# Patient Record
Sex: Female | Born: 1940
Health system: Southern US, Community
[De-identification: ages and names within clinical notes are randomized; demographics above are authoritative.]

## PROBLEM LIST (undated history)

## (undated) DIAGNOSIS — C4490 Unspecified malignant neoplasm of skin, unspecified: Secondary | ICD-10-CM

## (undated) DIAGNOSIS — Z808 Family history of malignant neoplasm of other organs or systems: Secondary | ICD-10-CM

## (undated) DIAGNOSIS — H409 Unspecified glaucoma: Secondary | ICD-10-CM

## (undated) DIAGNOSIS — F419 Anxiety disorder, unspecified: Secondary | ICD-10-CM

## (undated) DIAGNOSIS — E7800 Pure hypercholesterolemia, unspecified: Secondary | ICD-10-CM

## (undated) DIAGNOSIS — T8859XA Other complications of anesthesia, initial encounter: Secondary | ICD-10-CM

## (undated) DIAGNOSIS — K469 Unspecified abdominal hernia without obstruction or gangrene: Secondary | ICD-10-CM

## (undated) DIAGNOSIS — Z8042 Family history of malignant neoplasm of prostate: Secondary | ICD-10-CM

## (undated) DIAGNOSIS — D509 Iron deficiency anemia, unspecified: Secondary | ICD-10-CM

## (undated) DIAGNOSIS — Z803 Family history of malignant neoplasm of breast: Secondary | ICD-10-CM

## (undated) DIAGNOSIS — C569 Malignant neoplasm of unspecified ovary: Secondary | ICD-10-CM

## (undated) DIAGNOSIS — K5792 Diverticulitis of intestine, part unspecified, without perforation or abscess without bleeding: Secondary | ICD-10-CM

## (undated) DIAGNOSIS — I10 Essential (primary) hypertension: Secondary | ICD-10-CM

## (undated) DIAGNOSIS — Z800 Family history of malignant neoplasm of digestive organs: Secondary | ICD-10-CM

## (undated) DIAGNOSIS — E0590 Thyrotoxicosis, unspecified without thyrotoxic crisis or storm: Secondary | ICD-10-CM

## (undated) HISTORY — DX: Diverticulitis of intestine, part unspecified, without perforation or abscess without bleeding: K57.92

## (undated) HISTORY — PX: PARTIAL COLECTOMY: SHX5273

## (undated) HISTORY — DX: Unspecified glaucoma: H40.9

## (undated) HISTORY — DX: Family history of malignant neoplasm of digestive organs: Z80.0

## (undated) HISTORY — DX: Family history of malignant neoplasm of breast: Z80.3

## (undated) HISTORY — DX: Pure hypercholesterolemia, unspecified: E78.00

## (undated) HISTORY — DX: Malignant neoplasm of unspecified ovary: C56.9

## (undated) HISTORY — DX: Unspecified abdominal hernia without obstruction or gangrene: K46.9

## (undated) HISTORY — DX: Anxiety disorder, unspecified: F41.9

## (undated) HISTORY — DX: Unspecified malignant neoplasm of skin, unspecified: C44.90

## (undated) HISTORY — DX: Thyrotoxicosis, unspecified without thyrotoxic crisis or storm: E05.90

## (undated) HISTORY — DX: Family history of malignant neoplasm of prostate: Z80.42

## (undated) HISTORY — PX: OTHER SURGICAL HISTORY: SHX169

## (undated) HISTORY — PX: BREAST LUMPECTOMY: SHX2

## (undated) HISTORY — DX: Iron deficiency anemia, unspecified: D50.9

## (undated) HISTORY — DX: Essential (primary) hypertension: I10

## (undated) HISTORY — DX: Family history of malignant neoplasm of other organs or systems: Z80.8

## (undated) MED FILL — Dexamethasone Sodium Phosphate Inj 100 MG/10ML: INTRAMUSCULAR | Qty: 1 | Status: AC

## (undated) MED FILL — Fosaprepitant Dimeglumine For IV Infusion 150 MG (Base Eq): INTRAVENOUS | Qty: 5 | Status: AC

## (undated) MED FILL — Famotidine in NaCl 0.9% IV Soln 20 MG/50ML: INTRAVENOUS | Qty: 100 | Status: AC

## (undated) MED FILL — Doxorubicin HCl Liposomal Inj (For IV Infusion) 2 MG/ML: INTRAVENOUS | Qty: 25 | Status: AC

## (undated) MED FILL — Carboplatin IV Soln 450 MG/45ML: INTRAVENOUS | Qty: 36 | Status: AC

---

## 2011-03-03 DIAGNOSIS — R293 Abnormal posture: Secondary | ICD-10-CM | POA: Diagnosis not present

## 2011-03-03 DIAGNOSIS — M6281 Muscle weakness (generalized): Secondary | ICD-10-CM | POA: Diagnosis not present

## 2011-03-03 DIAGNOSIS — M545 Low back pain: Secondary | ICD-10-CM | POA: Diagnosis not present

## 2011-03-03 DIAGNOSIS — R269 Unspecified abnormalities of gait and mobility: Secondary | ICD-10-CM | POA: Diagnosis not present

## 2011-03-03 DIAGNOSIS — IMO0001 Reserved for inherently not codable concepts without codable children: Secondary | ICD-10-CM | POA: Diagnosis not present

## 2011-03-03 DIAGNOSIS — M2560 Stiffness of unspecified joint, not elsewhere classified: Secondary | ICD-10-CM | POA: Diagnosis not present

## 2011-03-05 DIAGNOSIS — IMO0001 Reserved for inherently not codable concepts without codable children: Secondary | ICD-10-CM | POA: Diagnosis not present

## 2011-03-05 DIAGNOSIS — M545 Low back pain: Secondary | ICD-10-CM | POA: Diagnosis not present

## 2011-03-05 DIAGNOSIS — R269 Unspecified abnormalities of gait and mobility: Secondary | ICD-10-CM | POA: Diagnosis not present

## 2011-03-05 DIAGNOSIS — R293 Abnormal posture: Secondary | ICD-10-CM | POA: Diagnosis not present

## 2011-03-05 DIAGNOSIS — M6281 Muscle weakness (generalized): Secondary | ICD-10-CM | POA: Diagnosis not present

## 2011-03-05 DIAGNOSIS — M2560 Stiffness of unspecified joint, not elsewhere classified: Secondary | ICD-10-CM | POA: Diagnosis not present

## 2011-03-10 DIAGNOSIS — IMO0001 Reserved for inherently not codable concepts without codable children: Secondary | ICD-10-CM | POA: Diagnosis not present

## 2011-03-10 DIAGNOSIS — R269 Unspecified abnormalities of gait and mobility: Secondary | ICD-10-CM | POA: Diagnosis not present

## 2011-03-10 DIAGNOSIS — M545 Low back pain: Secondary | ICD-10-CM | POA: Diagnosis not present

## 2011-03-10 DIAGNOSIS — R293 Abnormal posture: Secondary | ICD-10-CM | POA: Diagnosis not present

## 2011-03-10 DIAGNOSIS — M2560 Stiffness of unspecified joint, not elsewhere classified: Secondary | ICD-10-CM | POA: Diagnosis not present

## 2011-03-10 DIAGNOSIS — M6281 Muscle weakness (generalized): Secondary | ICD-10-CM | POA: Diagnosis not present

## 2011-03-12 DIAGNOSIS — IMO0001 Reserved for inherently not codable concepts without codable children: Secondary | ICD-10-CM | POA: Diagnosis not present

## 2011-03-12 DIAGNOSIS — R269 Unspecified abnormalities of gait and mobility: Secondary | ICD-10-CM | POA: Diagnosis not present

## 2011-03-12 DIAGNOSIS — R293 Abnormal posture: Secondary | ICD-10-CM | POA: Diagnosis not present

## 2011-03-12 DIAGNOSIS — M6281 Muscle weakness (generalized): Secondary | ICD-10-CM | POA: Diagnosis not present

## 2011-03-12 DIAGNOSIS — M2560 Stiffness of unspecified joint, not elsewhere classified: Secondary | ICD-10-CM | POA: Diagnosis not present

## 2011-03-12 DIAGNOSIS — M545 Low back pain: Secondary | ICD-10-CM | POA: Diagnosis not present

## 2011-03-17 DIAGNOSIS — M545 Low back pain: Secondary | ICD-10-CM | POA: Diagnosis not present

## 2011-03-17 DIAGNOSIS — M6281 Muscle weakness (generalized): Secondary | ICD-10-CM | POA: Diagnosis not present

## 2011-03-17 DIAGNOSIS — M2560 Stiffness of unspecified joint, not elsewhere classified: Secondary | ICD-10-CM | POA: Diagnosis not present

## 2011-03-17 DIAGNOSIS — R269 Unspecified abnormalities of gait and mobility: Secondary | ICD-10-CM | POA: Diagnosis not present

## 2011-03-17 DIAGNOSIS — IMO0001 Reserved for inherently not codable concepts without codable children: Secondary | ICD-10-CM | POA: Diagnosis not present

## 2011-03-17 DIAGNOSIS — R293 Abnormal posture: Secondary | ICD-10-CM | POA: Diagnosis not present

## 2011-03-19 DIAGNOSIS — M2560 Stiffness of unspecified joint, not elsewhere classified: Secondary | ICD-10-CM | POA: Diagnosis not present

## 2011-03-19 DIAGNOSIS — M545 Low back pain: Secondary | ICD-10-CM | POA: Diagnosis not present

## 2011-03-19 DIAGNOSIS — IMO0001 Reserved for inherently not codable concepts without codable children: Secondary | ICD-10-CM | POA: Diagnosis not present

## 2011-03-19 DIAGNOSIS — R293 Abnormal posture: Secondary | ICD-10-CM | POA: Diagnosis not present

## 2011-03-19 DIAGNOSIS — R269 Unspecified abnormalities of gait and mobility: Secondary | ICD-10-CM | POA: Diagnosis not present

## 2011-03-19 DIAGNOSIS — M6281 Muscle weakness (generalized): Secondary | ICD-10-CM | POA: Diagnosis not present

## 2011-03-23 DIAGNOSIS — Z1331 Encounter for screening for depression: Secondary | ICD-10-CM | POA: Diagnosis not present

## 2011-03-23 DIAGNOSIS — I10 Essential (primary) hypertension: Secondary | ICD-10-CM | POA: Diagnosis not present

## 2011-03-25 DIAGNOSIS — M545 Low back pain: Secondary | ICD-10-CM | POA: Diagnosis not present

## 2011-03-25 DIAGNOSIS — M6281 Muscle weakness (generalized): Secondary | ICD-10-CM | POA: Diagnosis not present

## 2011-03-25 DIAGNOSIS — IMO0001 Reserved for inherently not codable concepts without codable children: Secondary | ICD-10-CM | POA: Diagnosis not present

## 2011-03-25 DIAGNOSIS — M2560 Stiffness of unspecified joint, not elsewhere classified: Secondary | ICD-10-CM | POA: Diagnosis not present

## 2011-03-25 DIAGNOSIS — R293 Abnormal posture: Secondary | ICD-10-CM | POA: Diagnosis not present

## 2011-03-25 DIAGNOSIS — R269 Unspecified abnormalities of gait and mobility: Secondary | ICD-10-CM | POA: Diagnosis not present

## 2011-04-09 DIAGNOSIS — H409 Unspecified glaucoma: Secondary | ICD-10-CM | POA: Diagnosis not present

## 2011-04-23 DIAGNOSIS — Z13220 Encounter for screening for lipoid disorders: Secondary | ICD-10-CM | POA: Diagnosis not present

## 2011-04-23 DIAGNOSIS — I10 Essential (primary) hypertension: Secondary | ICD-10-CM | POA: Diagnosis not present

## 2011-04-23 DIAGNOSIS — E782 Mixed hyperlipidemia: Secondary | ICD-10-CM | POA: Diagnosis not present

## 2011-04-27 DIAGNOSIS — L509 Urticaria, unspecified: Secondary | ICD-10-CM | POA: Diagnosis not present

## 2011-05-14 DIAGNOSIS — Z0000 Encounter for general adult medical examination without abnormal findings: Secondary | ICD-10-CM | POA: Diagnosis not present

## 2011-05-14 DIAGNOSIS — Z1210 Encounter for screening for malignant neoplasm of intestinal tract, unspecified: Secondary | ICD-10-CM | POA: Diagnosis not present

## 2011-05-14 DIAGNOSIS — Z01419 Encounter for gynecological examination (general) (routine) without abnormal findings: Secondary | ICD-10-CM | POA: Diagnosis not present

## 2011-05-14 DIAGNOSIS — Z1231 Encounter for screening mammogram for malignant neoplasm of breast: Secondary | ICD-10-CM | POA: Diagnosis not present

## 2011-05-19 DIAGNOSIS — Z1231 Encounter for screening mammogram for malignant neoplasm of breast: Secondary | ICD-10-CM | POA: Diagnosis not present

## 2011-06-09 DIAGNOSIS — Z800 Family history of malignant neoplasm of digestive organs: Secondary | ICD-10-CM | POA: Diagnosis not present

## 2011-06-09 DIAGNOSIS — Z1211 Encounter for screening for malignant neoplasm of colon: Secondary | ICD-10-CM | POA: Diagnosis not present

## 2011-06-22 DIAGNOSIS — Z0000 Encounter for general adult medical examination without abnormal findings: Secondary | ICD-10-CM | POA: Diagnosis not present

## 2011-07-06 DIAGNOSIS — H900 Conductive hearing loss, bilateral: Secondary | ICD-10-CM | POA: Diagnosis not present

## 2011-07-06 DIAGNOSIS — G4700 Insomnia, unspecified: Secondary | ICD-10-CM | POA: Diagnosis not present

## 2011-08-17 DIAGNOSIS — I10 Essential (primary) hypertension: Secondary | ICD-10-CM | POA: Diagnosis not present

## 2011-08-17 DIAGNOSIS — K648 Other hemorrhoids: Secondary | ICD-10-CM | POA: Diagnosis not present

## 2011-08-17 DIAGNOSIS — Z79899 Other long term (current) drug therapy: Secondary | ICD-10-CM | POA: Diagnosis not present

## 2011-08-17 DIAGNOSIS — K5730 Diverticulosis of large intestine without perforation or abscess without bleeding: Secondary | ICD-10-CM | POA: Diagnosis not present

## 2011-08-17 DIAGNOSIS — Z800 Family history of malignant neoplasm of digestive organs: Secondary | ICD-10-CM | POA: Diagnosis not present

## 2011-08-17 DIAGNOSIS — H409 Unspecified glaucoma: Secondary | ICD-10-CM | POA: Diagnosis not present

## 2011-08-17 DIAGNOSIS — E7800 Pure hypercholesterolemia, unspecified: Secondary | ICD-10-CM | POA: Diagnosis not present

## 2011-08-17 DIAGNOSIS — M1990 Unspecified osteoarthritis, unspecified site: Secondary | ICD-10-CM | POA: Diagnosis not present

## 2011-08-17 DIAGNOSIS — Z1211 Encounter for screening for malignant neoplasm of colon: Secondary | ICD-10-CM | POA: Diagnosis not present

## 2011-08-17 DIAGNOSIS — Z87891 Personal history of nicotine dependence: Secondary | ICD-10-CM | POA: Diagnosis not present

## 2011-08-17 DIAGNOSIS — E0590 Thyrotoxicosis, unspecified without thyrotoxic crisis or storm: Secondary | ICD-10-CM | POA: Diagnosis not present

## 2011-08-17 DIAGNOSIS — K644 Residual hemorrhoidal skin tags: Secondary | ICD-10-CM | POA: Diagnosis not present

## 2011-09-03 DIAGNOSIS — H2589 Other age-related cataract: Secondary | ICD-10-CM | POA: Diagnosis not present

## 2011-09-03 DIAGNOSIS — H409 Unspecified glaucoma: Secondary | ICD-10-CM | POA: Diagnosis not present

## 2011-09-03 DIAGNOSIS — H04129 Dry eye syndrome of unspecified lacrimal gland: Secondary | ICD-10-CM | POA: Diagnosis not present

## 2012-02-01 DIAGNOSIS — E0500 Thyrotoxicosis with diffuse goiter without thyrotoxic crisis or storm: Secondary | ICD-10-CM | POA: Diagnosis not present

## 2012-02-01 DIAGNOSIS — I10 Essential (primary) hypertension: Secondary | ICD-10-CM | POA: Diagnosis not present

## 2012-02-12 DIAGNOSIS — R300 Dysuria: Secondary | ICD-10-CM | POA: Diagnosis not present

## 2012-02-12 DIAGNOSIS — N3000 Acute cystitis without hematuria: Secondary | ICD-10-CM | POA: Diagnosis not present

## 2012-02-21 DIAGNOSIS — R109 Unspecified abdominal pain: Secondary | ICD-10-CM | POA: Diagnosis not present

## 2012-02-21 DIAGNOSIS — R1032 Left lower quadrant pain: Secondary | ICD-10-CM | POA: Diagnosis not present

## 2012-02-21 DIAGNOSIS — B9689 Other specified bacterial agents as the cause of diseases classified elsewhere: Secondary | ICD-10-CM | POA: Diagnosis not present

## 2012-02-21 DIAGNOSIS — E0590 Thyrotoxicosis, unspecified without thyrotoxic crisis or storm: Secondary | ICD-10-CM | POA: Diagnosis not present

## 2012-02-21 DIAGNOSIS — I10 Essential (primary) hypertension: Secondary | ICD-10-CM | POA: Diagnosis not present

## 2012-02-21 DIAGNOSIS — Z7982 Long term (current) use of aspirin: Secondary | ICD-10-CM | POA: Diagnosis not present

## 2012-02-21 DIAGNOSIS — K5732 Diverticulitis of large intestine without perforation or abscess without bleeding: Secondary | ICD-10-CM | POA: Diagnosis not present

## 2012-02-21 DIAGNOSIS — K631 Perforation of intestine (nontraumatic): Secondary | ICD-10-CM | POA: Diagnosis not present

## 2012-03-06 DIAGNOSIS — S68118A Complete traumatic metacarpophalangeal amputation of other finger, initial encounter: Secondary | ICD-10-CM | POA: Diagnosis not present

## 2012-03-06 DIAGNOSIS — K651 Peritoneal abscess: Secondary | ICD-10-CM | POA: Diagnosis not present

## 2012-03-06 DIAGNOSIS — Z5331 Laparoscopic surgical procedure converted to open procedure: Secondary | ICD-10-CM | POA: Diagnosis not present

## 2012-03-06 DIAGNOSIS — K660 Peritoneal adhesions (postprocedural) (postinfection): Secondary | ICD-10-CM | POA: Diagnosis not present

## 2012-03-06 DIAGNOSIS — K630 Abscess of intestine: Secondary | ICD-10-CM | POA: Diagnosis not present

## 2012-03-06 DIAGNOSIS — B952 Enterococcus as the cause of diseases classified elsewhere: Secondary | ICD-10-CM | POA: Diagnosis not present

## 2012-03-06 DIAGNOSIS — R1032 Left lower quadrant pain: Secondary | ICD-10-CM | POA: Diagnosis not present

## 2012-03-06 DIAGNOSIS — R197 Diarrhea, unspecified: Secondary | ICD-10-CM | POA: Diagnosis not present

## 2012-03-06 DIAGNOSIS — E0590 Thyrotoxicosis, unspecified without thyrotoxic crisis or storm: Secondary | ICD-10-CM | POA: Diagnosis not present

## 2012-03-06 DIAGNOSIS — I10 Essential (primary) hypertension: Secondary | ICD-10-CM | POA: Diagnosis not present

## 2012-03-06 DIAGNOSIS — R112 Nausea with vomiting, unspecified: Secondary | ICD-10-CM | POA: Diagnosis not present

## 2012-03-06 DIAGNOSIS — K5732 Diverticulitis of large intestine without perforation or abscess without bleeding: Secondary | ICD-10-CM | POA: Diagnosis not present

## 2012-03-07 DIAGNOSIS — K630 Abscess of intestine: Secondary | ICD-10-CM | POA: Diagnosis not present

## 2012-03-07 DIAGNOSIS — R112 Nausea with vomiting, unspecified: Secondary | ICD-10-CM | POA: Diagnosis not present

## 2012-03-07 DIAGNOSIS — Z5331 Laparoscopic surgical procedure converted to open procedure: Secondary | ICD-10-CM | POA: Diagnosis not present

## 2012-03-07 DIAGNOSIS — K651 Peritoneal abscess: Secondary | ICD-10-CM | POA: Diagnosis not present

## 2012-03-07 DIAGNOSIS — I10 Essential (primary) hypertension: Secondary | ICD-10-CM | POA: Diagnosis not present

## 2012-03-07 DIAGNOSIS — R1032 Left lower quadrant pain: Secondary | ICD-10-CM | POA: Diagnosis not present

## 2012-03-07 DIAGNOSIS — E0590 Thyrotoxicosis, unspecified without thyrotoxic crisis or storm: Secondary | ICD-10-CM | POA: Diagnosis present

## 2012-03-07 DIAGNOSIS — E039 Hypothyroidism, unspecified: Secondary | ICD-10-CM | POA: Diagnosis not present

## 2012-03-07 DIAGNOSIS — K660 Peritoneal adhesions (postprocedural) (postinfection): Secondary | ICD-10-CM | POA: Diagnosis not present

## 2012-03-07 DIAGNOSIS — R197 Diarrhea, unspecified: Secondary | ICD-10-CM | POA: Diagnosis not present

## 2012-03-07 DIAGNOSIS — B952 Enterococcus as the cause of diseases classified elsewhere: Secondary | ICD-10-CM | POA: Diagnosis not present

## 2012-03-07 DIAGNOSIS — S68118A Complete traumatic metacarpophalangeal amputation of other finger, initial encounter: Secondary | ICD-10-CM | POA: Diagnosis not present

## 2012-03-07 DIAGNOSIS — K5732 Diverticulitis of large intestine without perforation or abscess without bleeding: Secondary | ICD-10-CM | POA: Diagnosis not present

## 2012-03-16 DIAGNOSIS — Z433 Encounter for attention to colostomy: Secondary | ICD-10-CM | POA: Diagnosis not present

## 2012-03-16 DIAGNOSIS — Z1639 Resistance to other specified antimicrobial drug: Secondary | ICD-10-CM | POA: Diagnosis not present

## 2012-03-16 DIAGNOSIS — Z4789 Encounter for other orthopedic aftercare: Secondary | ICD-10-CM | POA: Diagnosis not present

## 2012-03-16 DIAGNOSIS — I10 Essential (primary) hypertension: Secondary | ICD-10-CM | POA: Diagnosis not present

## 2012-03-18 DIAGNOSIS — I10 Essential (primary) hypertension: Secondary | ICD-10-CM | POA: Diagnosis not present

## 2012-03-18 DIAGNOSIS — Z433 Encounter for attention to colostomy: Secondary | ICD-10-CM | POA: Diagnosis not present

## 2012-03-18 DIAGNOSIS — Z4789 Encounter for other orthopedic aftercare: Secondary | ICD-10-CM | POA: Diagnosis not present

## 2012-03-18 DIAGNOSIS — Z1639 Resistance to other specified antimicrobial drug: Secondary | ICD-10-CM | POA: Diagnosis not present

## 2012-03-22 DIAGNOSIS — Z433 Encounter for attention to colostomy: Secondary | ICD-10-CM | POA: Diagnosis not present

## 2012-03-22 DIAGNOSIS — Z1639 Resistance to other specified antimicrobial drug: Secondary | ICD-10-CM | POA: Diagnosis not present

## 2012-03-22 DIAGNOSIS — Z4789 Encounter for other orthopedic aftercare: Secondary | ICD-10-CM | POA: Diagnosis not present

## 2012-03-22 DIAGNOSIS — I10 Essential (primary) hypertension: Secondary | ICD-10-CM | POA: Diagnosis not present

## 2012-03-25 DIAGNOSIS — I10 Essential (primary) hypertension: Secondary | ICD-10-CM | POA: Diagnosis not present

## 2012-03-25 DIAGNOSIS — Z1639 Resistance to other specified antimicrobial drug: Secondary | ICD-10-CM | POA: Diagnosis not present

## 2012-03-25 DIAGNOSIS — Z4789 Encounter for other orthopedic aftercare: Secondary | ICD-10-CM | POA: Diagnosis not present

## 2012-03-25 DIAGNOSIS — Z433 Encounter for attention to colostomy: Secondary | ICD-10-CM | POA: Diagnosis not present

## 2012-03-29 DIAGNOSIS — Z433 Encounter for attention to colostomy: Secondary | ICD-10-CM | POA: Diagnosis not present

## 2012-03-29 DIAGNOSIS — B373 Candidiasis of vulva and vagina: Secondary | ICD-10-CM | POA: Diagnosis not present

## 2012-03-29 DIAGNOSIS — R5383 Other fatigue: Secondary | ICD-10-CM | POA: Diagnosis not present

## 2012-03-29 DIAGNOSIS — Z4789 Encounter for other orthopedic aftercare: Secondary | ICD-10-CM | POA: Diagnosis not present

## 2012-03-29 DIAGNOSIS — I10 Essential (primary) hypertension: Secondary | ICD-10-CM | POA: Diagnosis not present

## 2012-03-29 DIAGNOSIS — R5381 Other malaise: Secondary | ICD-10-CM | POA: Diagnosis not present

## 2012-03-29 DIAGNOSIS — Z1639 Resistance to other specified antimicrobial drug: Secondary | ICD-10-CM | POA: Diagnosis not present

## 2012-04-01 DIAGNOSIS — Z4789 Encounter for other orthopedic aftercare: Secondary | ICD-10-CM | POA: Diagnosis not present

## 2012-04-01 DIAGNOSIS — Z433 Encounter for attention to colostomy: Secondary | ICD-10-CM | POA: Diagnosis not present

## 2012-04-01 DIAGNOSIS — I10 Essential (primary) hypertension: Secondary | ICD-10-CM | POA: Diagnosis not present

## 2012-04-01 DIAGNOSIS — Z1639 Resistance to other specified antimicrobial drug: Secondary | ICD-10-CM | POA: Diagnosis not present

## 2012-04-04 DIAGNOSIS — Z433 Encounter for attention to colostomy: Secondary | ICD-10-CM | POA: Diagnosis not present

## 2012-04-05 DIAGNOSIS — Z4789 Encounter for other orthopedic aftercare: Secondary | ICD-10-CM | POA: Diagnosis not present

## 2012-04-05 DIAGNOSIS — Z433 Encounter for attention to colostomy: Secondary | ICD-10-CM | POA: Diagnosis not present

## 2012-04-05 DIAGNOSIS — Z1639 Resistance to other specified antimicrobial drug: Secondary | ICD-10-CM | POA: Diagnosis not present

## 2012-04-05 DIAGNOSIS — I10 Essential (primary) hypertension: Secondary | ICD-10-CM | POA: Diagnosis not present

## 2012-04-09 DIAGNOSIS — I10 Essential (primary) hypertension: Secondary | ICD-10-CM | POA: Diagnosis not present

## 2012-04-09 DIAGNOSIS — Z433 Encounter for attention to colostomy: Secondary | ICD-10-CM | POA: Diagnosis not present

## 2012-04-09 DIAGNOSIS — Z1639 Resistance to other specified antimicrobial drug: Secondary | ICD-10-CM | POA: Diagnosis not present

## 2012-04-09 DIAGNOSIS — Z4789 Encounter for other orthopedic aftercare: Secondary | ICD-10-CM | POA: Diagnosis not present

## 2012-04-12 DIAGNOSIS — Z433 Encounter for attention to colostomy: Secondary | ICD-10-CM | POA: Diagnosis not present

## 2012-04-12 DIAGNOSIS — I10 Essential (primary) hypertension: Secondary | ICD-10-CM | POA: Diagnosis not present

## 2012-04-12 DIAGNOSIS — Z1639 Resistance to other specified antimicrobial drug: Secondary | ICD-10-CM | POA: Diagnosis not present

## 2012-04-12 DIAGNOSIS — Z4789 Encounter for other orthopedic aftercare: Secondary | ICD-10-CM | POA: Diagnosis not present

## 2012-04-15 DIAGNOSIS — Z1639 Resistance to other specified antimicrobial drug: Secondary | ICD-10-CM | POA: Diagnosis not present

## 2012-04-15 DIAGNOSIS — Z433 Encounter for attention to colostomy: Secondary | ICD-10-CM | POA: Diagnosis not present

## 2012-04-15 DIAGNOSIS — I10 Essential (primary) hypertension: Secondary | ICD-10-CM | POA: Diagnosis not present

## 2012-04-15 DIAGNOSIS — Z4789 Encounter for other orthopedic aftercare: Secondary | ICD-10-CM | POA: Diagnosis not present

## 2012-04-19 DIAGNOSIS — Z433 Encounter for attention to colostomy: Secondary | ICD-10-CM | POA: Diagnosis not present

## 2012-04-19 DIAGNOSIS — Z1639 Resistance to other specified antimicrobial drug: Secondary | ICD-10-CM | POA: Diagnosis not present

## 2012-04-19 DIAGNOSIS — Z4789 Encounter for other orthopedic aftercare: Secondary | ICD-10-CM | POA: Diagnosis not present

## 2012-04-19 DIAGNOSIS — I10 Essential (primary) hypertension: Secondary | ICD-10-CM | POA: Diagnosis not present

## 2012-04-20 DIAGNOSIS — H409 Unspecified glaucoma: Secondary | ICD-10-CM | POA: Diagnosis not present

## 2012-04-20 DIAGNOSIS — Z933 Colostomy status: Secondary | ICD-10-CM | POA: Diagnosis not present

## 2012-04-20 DIAGNOSIS — R1032 Left lower quadrant pain: Secondary | ICD-10-CM | POA: Diagnosis not present

## 2012-04-20 DIAGNOSIS — Z8719 Personal history of other diseases of the digestive system: Secondary | ICD-10-CM | POA: Diagnosis not present

## 2012-04-20 DIAGNOSIS — K5732 Diverticulitis of large intestine without perforation or abscess without bleeding: Secondary | ICD-10-CM | POA: Diagnosis not present

## 2012-04-20 DIAGNOSIS — Z9049 Acquired absence of other specified parts of digestive tract: Secondary | ICD-10-CM | POA: Diagnosis not present

## 2012-04-20 DIAGNOSIS — R110 Nausea: Secondary | ICD-10-CM | POA: Diagnosis not present

## 2012-04-20 DIAGNOSIS — I10 Essential (primary) hypertension: Secondary | ICD-10-CM | POA: Diagnosis not present

## 2012-04-20 DIAGNOSIS — E0500 Thyrotoxicosis with diffuse goiter without thyrotoxic crisis or storm: Secondary | ICD-10-CM | POA: Diagnosis not present

## 2012-04-22 DIAGNOSIS — I10 Essential (primary) hypertension: Secondary | ICD-10-CM | POA: Diagnosis not present

## 2012-04-22 DIAGNOSIS — Z1639 Resistance to other specified antimicrobial drug: Secondary | ICD-10-CM | POA: Diagnosis not present

## 2012-04-22 DIAGNOSIS — Z433 Encounter for attention to colostomy: Secondary | ICD-10-CM | POA: Diagnosis not present

## 2012-04-22 DIAGNOSIS — Z4789 Encounter for other orthopedic aftercare: Secondary | ICD-10-CM | POA: Diagnosis not present

## 2012-04-23 DIAGNOSIS — Z9049 Acquired absence of other specified parts of digestive tract: Secondary | ICD-10-CM | POA: Diagnosis not present

## 2012-04-23 DIAGNOSIS — K5732 Diverticulitis of large intestine without perforation or abscess without bleeding: Secondary | ICD-10-CM | POA: Diagnosis not present

## 2012-04-23 DIAGNOSIS — I10 Essential (primary) hypertension: Secondary | ICD-10-CM | POA: Diagnosis not present

## 2012-04-23 DIAGNOSIS — Z7982 Long term (current) use of aspirin: Secondary | ICD-10-CM | POA: Diagnosis not present

## 2012-04-23 DIAGNOSIS — R110 Nausea: Secondary | ICD-10-CM | POA: Diagnosis not present

## 2012-04-23 DIAGNOSIS — E0500 Thyrotoxicosis with diffuse goiter without thyrotoxic crisis or storm: Secondary | ICD-10-CM | POA: Diagnosis not present

## 2012-04-23 DIAGNOSIS — Z933 Colostomy status: Secondary | ICD-10-CM | POA: Diagnosis not present

## 2012-04-23 DIAGNOSIS — Z8719 Personal history of other diseases of the digestive system: Secondary | ICD-10-CM | POA: Diagnosis not present

## 2012-04-23 DIAGNOSIS — H409 Unspecified glaucoma: Secondary | ICD-10-CM | POA: Diagnosis not present

## 2012-04-23 DIAGNOSIS — R1032 Left lower quadrant pain: Secondary | ICD-10-CM | POA: Diagnosis not present

## 2012-04-23 DIAGNOSIS — Z87891 Personal history of nicotine dependence: Secondary | ICD-10-CM | POA: Diagnosis not present

## 2012-04-23 DIAGNOSIS — Z79899 Other long term (current) drug therapy: Secondary | ICD-10-CM | POA: Diagnosis not present

## 2012-05-02 DIAGNOSIS — Z1639 Resistance to other specified antimicrobial drug: Secondary | ICD-10-CM | POA: Diagnosis not present

## 2012-05-02 DIAGNOSIS — Z4789 Encounter for other orthopedic aftercare: Secondary | ICD-10-CM | POA: Diagnosis not present

## 2012-05-02 DIAGNOSIS — I10 Essential (primary) hypertension: Secondary | ICD-10-CM | POA: Diagnosis not present

## 2012-05-02 DIAGNOSIS — Z433 Encounter for attention to colostomy: Secondary | ICD-10-CM | POA: Diagnosis not present

## 2012-05-06 DIAGNOSIS — I10 Essential (primary) hypertension: Secondary | ICD-10-CM | POA: Diagnosis not present

## 2012-05-06 DIAGNOSIS — Z1639 Resistance to other specified antimicrobial drug: Secondary | ICD-10-CM | POA: Diagnosis not present

## 2012-05-06 DIAGNOSIS — Z4789 Encounter for other orthopedic aftercare: Secondary | ICD-10-CM | POA: Diagnosis not present

## 2012-05-06 DIAGNOSIS — Z433 Encounter for attention to colostomy: Secondary | ICD-10-CM | POA: Diagnosis not present

## 2012-05-10 DIAGNOSIS — Z4789 Encounter for other orthopedic aftercare: Secondary | ICD-10-CM | POA: Diagnosis not present

## 2012-05-10 DIAGNOSIS — I10 Essential (primary) hypertension: Secondary | ICD-10-CM | POA: Diagnosis not present

## 2012-05-10 DIAGNOSIS — Z433 Encounter for attention to colostomy: Secondary | ICD-10-CM | POA: Diagnosis not present

## 2012-05-10 DIAGNOSIS — Z1639 Resistance to other specified antimicrobial drug: Secondary | ICD-10-CM | POA: Diagnosis not present

## 2012-05-13 DIAGNOSIS — Z433 Encounter for attention to colostomy: Secondary | ICD-10-CM | POA: Diagnosis not present

## 2012-05-13 DIAGNOSIS — Z1639 Resistance to other specified antimicrobial drug: Secondary | ICD-10-CM | POA: Diagnosis not present

## 2012-05-13 DIAGNOSIS — I10 Essential (primary) hypertension: Secondary | ICD-10-CM | POA: Diagnosis not present

## 2012-05-13 DIAGNOSIS — Z4789 Encounter for other orthopedic aftercare: Secondary | ICD-10-CM | POA: Diagnosis not present

## 2012-05-15 DIAGNOSIS — T8140XA Infection following a procedure, unspecified, initial encounter: Secondary | ICD-10-CM | POA: Diagnosis not present

## 2012-05-15 DIAGNOSIS — Z1639 Resistance to other specified antimicrobial drug: Secondary | ICD-10-CM | POA: Diagnosis not present

## 2012-05-15 DIAGNOSIS — Z433 Encounter for attention to colostomy: Secondary | ICD-10-CM | POA: Diagnosis not present

## 2012-05-15 DIAGNOSIS — I10 Essential (primary) hypertension: Secondary | ICD-10-CM | POA: Diagnosis not present

## 2012-05-16 DIAGNOSIS — Z1639 Resistance to other specified antimicrobial drug: Secondary | ICD-10-CM | POA: Diagnosis not present

## 2012-05-16 DIAGNOSIS — Z433 Encounter for attention to colostomy: Secondary | ICD-10-CM | POA: Diagnosis not present

## 2012-05-16 DIAGNOSIS — G8918 Other acute postprocedural pain: Secondary | ICD-10-CM | POA: Diagnosis not present

## 2012-05-16 DIAGNOSIS — I10 Essential (primary) hypertension: Secondary | ICD-10-CM | POA: Diagnosis not present

## 2012-05-16 DIAGNOSIS — Z79899 Other long term (current) drug therapy: Secondary | ICD-10-CM | POA: Diagnosis not present

## 2012-05-16 DIAGNOSIS — T8140XA Infection following a procedure, unspecified, initial encounter: Secondary | ICD-10-CM | POA: Diagnosis not present

## 2012-05-18 DIAGNOSIS — Z1639 Resistance to other specified antimicrobial drug: Secondary | ICD-10-CM | POA: Diagnosis not present

## 2012-05-18 DIAGNOSIS — Z433 Encounter for attention to colostomy: Secondary | ICD-10-CM | POA: Diagnosis not present

## 2012-05-18 DIAGNOSIS — T8140XA Infection following a procedure, unspecified, initial encounter: Secondary | ICD-10-CM | POA: Diagnosis not present

## 2012-05-18 DIAGNOSIS — I10 Essential (primary) hypertension: Secondary | ICD-10-CM | POA: Diagnosis not present

## 2012-05-20 DIAGNOSIS — T8140XA Infection following a procedure, unspecified, initial encounter: Secondary | ICD-10-CM | POA: Diagnosis not present

## 2012-05-20 DIAGNOSIS — Z433 Encounter for attention to colostomy: Secondary | ICD-10-CM | POA: Diagnosis not present

## 2012-05-20 DIAGNOSIS — Z1639 Resistance to other specified antimicrobial drug: Secondary | ICD-10-CM | POA: Diagnosis not present

## 2012-05-20 DIAGNOSIS — I10 Essential (primary) hypertension: Secondary | ICD-10-CM | POA: Diagnosis not present

## 2012-05-24 DIAGNOSIS — T8140XA Infection following a procedure, unspecified, initial encounter: Secondary | ICD-10-CM | POA: Diagnosis not present

## 2012-05-24 DIAGNOSIS — Z433 Encounter for attention to colostomy: Secondary | ICD-10-CM | POA: Diagnosis not present

## 2012-05-24 DIAGNOSIS — I10 Essential (primary) hypertension: Secondary | ICD-10-CM | POA: Diagnosis not present

## 2012-05-24 DIAGNOSIS — Z1639 Resistance to other specified antimicrobial drug: Secondary | ICD-10-CM | POA: Diagnosis not present

## 2012-05-27 DIAGNOSIS — I10 Essential (primary) hypertension: Secondary | ICD-10-CM | POA: Diagnosis not present

## 2012-05-27 DIAGNOSIS — Z433 Encounter for attention to colostomy: Secondary | ICD-10-CM | POA: Diagnosis not present

## 2012-05-27 DIAGNOSIS — Z1639 Resistance to other specified antimicrobial drug: Secondary | ICD-10-CM | POA: Diagnosis not present

## 2012-05-27 DIAGNOSIS — T8140XA Infection following a procedure, unspecified, initial encounter: Secondary | ICD-10-CM | POA: Diagnosis not present

## 2012-05-31 DIAGNOSIS — T8140XA Infection following a procedure, unspecified, initial encounter: Secondary | ICD-10-CM | POA: Diagnosis not present

## 2012-05-31 DIAGNOSIS — I10 Essential (primary) hypertension: Secondary | ICD-10-CM | POA: Diagnosis not present

## 2012-05-31 DIAGNOSIS — Z1639 Resistance to other specified antimicrobial drug: Secondary | ICD-10-CM | POA: Diagnosis not present

## 2012-05-31 DIAGNOSIS — Z433 Encounter for attention to colostomy: Secondary | ICD-10-CM | POA: Diagnosis not present

## 2012-06-03 DIAGNOSIS — I10 Essential (primary) hypertension: Secondary | ICD-10-CM | POA: Diagnosis not present

## 2012-06-03 DIAGNOSIS — T8140XA Infection following a procedure, unspecified, initial encounter: Secondary | ICD-10-CM | POA: Diagnosis not present

## 2012-06-03 DIAGNOSIS — Z1639 Resistance to other specified antimicrobial drug: Secondary | ICD-10-CM | POA: Diagnosis not present

## 2012-06-03 DIAGNOSIS — Z433 Encounter for attention to colostomy: Secondary | ICD-10-CM | POA: Diagnosis not present

## 2012-06-07 DIAGNOSIS — T8140XA Infection following a procedure, unspecified, initial encounter: Secondary | ICD-10-CM | POA: Diagnosis not present

## 2012-06-07 DIAGNOSIS — Z433 Encounter for attention to colostomy: Secondary | ICD-10-CM | POA: Diagnosis not present

## 2012-06-07 DIAGNOSIS — Z1639 Resistance to other specified antimicrobial drug: Secondary | ICD-10-CM | POA: Diagnosis not present

## 2012-06-07 DIAGNOSIS — I10 Essential (primary) hypertension: Secondary | ICD-10-CM | POA: Diagnosis not present

## 2012-06-08 DIAGNOSIS — I10 Essential (primary) hypertension: Secondary | ICD-10-CM | POA: Diagnosis not present

## 2012-06-08 DIAGNOSIS — Z1639 Resistance to other specified antimicrobial drug: Secondary | ICD-10-CM | POA: Diagnosis not present

## 2012-06-08 DIAGNOSIS — Z433 Encounter for attention to colostomy: Secondary | ICD-10-CM | POA: Diagnosis not present

## 2012-06-08 DIAGNOSIS — Z8639 Personal history of other endocrine, nutritional and metabolic disease: Secondary | ICD-10-CM | POA: Diagnosis not present

## 2012-06-08 DIAGNOSIS — B952 Enterococcus as the cause of diseases classified elsewhere: Secondary | ICD-10-CM | POA: Diagnosis not present

## 2012-06-08 DIAGNOSIS — Z862 Personal history of diseases of the blood and blood-forming organs and certain disorders involving the immune mechanism: Secondary | ICD-10-CM | POA: Diagnosis not present

## 2012-06-08 DIAGNOSIS — K5732 Diverticulitis of large intestine without perforation or abscess without bleeding: Secondary | ICD-10-CM | POA: Diagnosis not present

## 2012-06-10 DIAGNOSIS — Z433 Encounter for attention to colostomy: Secondary | ICD-10-CM | POA: Diagnosis not present

## 2012-06-10 DIAGNOSIS — T8140XA Infection following a procedure, unspecified, initial encounter: Secondary | ICD-10-CM | POA: Diagnosis not present

## 2012-06-10 DIAGNOSIS — Z1639 Resistance to other specified antimicrobial drug: Secondary | ICD-10-CM | POA: Diagnosis not present

## 2012-06-10 DIAGNOSIS — I10 Essential (primary) hypertension: Secondary | ICD-10-CM | POA: Diagnosis not present

## 2012-06-14 DIAGNOSIS — T8140XA Infection following a procedure, unspecified, initial encounter: Secondary | ICD-10-CM | POA: Diagnosis not present

## 2012-06-14 DIAGNOSIS — I10 Essential (primary) hypertension: Secondary | ICD-10-CM | POA: Diagnosis not present

## 2012-06-14 DIAGNOSIS — Z1639 Resistance to other specified antimicrobial drug: Secondary | ICD-10-CM | POA: Diagnosis not present

## 2012-06-14 DIAGNOSIS — Z433 Encounter for attention to colostomy: Secondary | ICD-10-CM | POA: Diagnosis not present

## 2012-06-15 DIAGNOSIS — H4010X0 Unspecified open-angle glaucoma, stage unspecified: Secondary | ICD-10-CM | POA: Diagnosis not present

## 2012-06-15 DIAGNOSIS — H18419 Arcus senilis, unspecified eye: Secondary | ICD-10-CM | POA: Diagnosis not present

## 2012-06-15 DIAGNOSIS — H2510 Age-related nuclear cataract, unspecified eye: Secondary | ICD-10-CM | POA: Diagnosis not present

## 2012-06-17 DIAGNOSIS — Z433 Encounter for attention to colostomy: Secondary | ICD-10-CM | POA: Diagnosis not present

## 2012-06-17 DIAGNOSIS — I10 Essential (primary) hypertension: Secondary | ICD-10-CM | POA: Diagnosis not present

## 2012-06-17 DIAGNOSIS — T8140XA Infection following a procedure, unspecified, initial encounter: Secondary | ICD-10-CM | POA: Diagnosis not present

## 2012-06-17 DIAGNOSIS — Z1639 Resistance to other specified antimicrobial drug: Secondary | ICD-10-CM | POA: Diagnosis not present

## 2012-06-22 DIAGNOSIS — Z1639 Resistance to other specified antimicrobial drug: Secondary | ICD-10-CM | POA: Diagnosis not present

## 2012-06-22 DIAGNOSIS — B952 Enterococcus as the cause of diseases classified elsewhere: Secondary | ICD-10-CM | POA: Diagnosis not present

## 2012-06-24 DIAGNOSIS — I10 Essential (primary) hypertension: Secondary | ICD-10-CM | POA: Diagnosis not present

## 2012-06-24 DIAGNOSIS — Z1639 Resistance to other specified antimicrobial drug: Secondary | ICD-10-CM | POA: Diagnosis not present

## 2012-06-24 DIAGNOSIS — Z433 Encounter for attention to colostomy: Secondary | ICD-10-CM | POA: Diagnosis not present

## 2012-06-24 DIAGNOSIS — T8140XA Infection following a procedure, unspecified, initial encounter: Secondary | ICD-10-CM | POA: Diagnosis not present

## 2012-06-27 DIAGNOSIS — I10 Essential (primary) hypertension: Secondary | ICD-10-CM | POA: Diagnosis not present

## 2012-06-27 DIAGNOSIS — Z433 Encounter for attention to colostomy: Secondary | ICD-10-CM | POA: Diagnosis not present

## 2012-06-27 DIAGNOSIS — Z1639 Resistance to other specified antimicrobial drug: Secondary | ICD-10-CM | POA: Diagnosis not present

## 2012-06-27 DIAGNOSIS — T8140XA Infection following a procedure, unspecified, initial encounter: Secondary | ICD-10-CM | POA: Diagnosis not present

## 2012-06-29 DIAGNOSIS — Z1639 Resistance to other specified antimicrobial drug: Secondary | ICD-10-CM | POA: Diagnosis not present

## 2012-06-29 DIAGNOSIS — Z433 Encounter for attention to colostomy: Secondary | ICD-10-CM | POA: Diagnosis not present

## 2012-06-29 DIAGNOSIS — T8140XA Infection following a procedure, unspecified, initial encounter: Secondary | ICD-10-CM | POA: Diagnosis not present

## 2012-06-29 DIAGNOSIS — I10 Essential (primary) hypertension: Secondary | ICD-10-CM | POA: Diagnosis not present

## 2012-07-01 DIAGNOSIS — T8140XA Infection following a procedure, unspecified, initial encounter: Secondary | ICD-10-CM | POA: Diagnosis not present

## 2012-07-01 DIAGNOSIS — I10 Essential (primary) hypertension: Secondary | ICD-10-CM | POA: Diagnosis not present

## 2012-07-01 DIAGNOSIS — Z1639 Resistance to other specified antimicrobial drug: Secondary | ICD-10-CM | POA: Diagnosis not present

## 2012-07-01 DIAGNOSIS — Z433 Encounter for attention to colostomy: Secondary | ICD-10-CM | POA: Diagnosis not present

## 2012-07-04 DIAGNOSIS — T8140XA Infection following a procedure, unspecified, initial encounter: Secondary | ICD-10-CM | POA: Diagnosis not present

## 2012-07-04 DIAGNOSIS — Z433 Encounter for attention to colostomy: Secondary | ICD-10-CM | POA: Diagnosis not present

## 2012-07-04 DIAGNOSIS — Z1639 Resistance to other specified antimicrobial drug: Secondary | ICD-10-CM | POA: Diagnosis not present

## 2012-07-04 DIAGNOSIS — I10 Essential (primary) hypertension: Secondary | ICD-10-CM | POA: Diagnosis not present

## 2012-07-06 DIAGNOSIS — Z433 Encounter for attention to colostomy: Secondary | ICD-10-CM | POA: Diagnosis not present

## 2012-07-06 DIAGNOSIS — Z1639 Resistance to other specified antimicrobial drug: Secondary | ICD-10-CM | POA: Diagnosis not present

## 2012-07-06 DIAGNOSIS — I10 Essential (primary) hypertension: Secondary | ICD-10-CM | POA: Diagnosis not present

## 2012-07-06 DIAGNOSIS — T8140XA Infection following a procedure, unspecified, initial encounter: Secondary | ICD-10-CM | POA: Diagnosis not present

## 2012-07-08 DIAGNOSIS — Z1639 Resistance to other specified antimicrobial drug: Secondary | ICD-10-CM | POA: Diagnosis not present

## 2012-07-08 DIAGNOSIS — I10 Essential (primary) hypertension: Secondary | ICD-10-CM | POA: Diagnosis not present

## 2012-07-08 DIAGNOSIS — Z433 Encounter for attention to colostomy: Secondary | ICD-10-CM | POA: Diagnosis not present

## 2012-07-08 DIAGNOSIS — T8140XA Infection following a procedure, unspecified, initial encounter: Secondary | ICD-10-CM | POA: Diagnosis not present

## 2012-07-11 DIAGNOSIS — Z433 Encounter for attention to colostomy: Secondary | ICD-10-CM | POA: Diagnosis not present

## 2012-07-11 DIAGNOSIS — T8140XA Infection following a procedure, unspecified, initial encounter: Secondary | ICD-10-CM | POA: Diagnosis not present

## 2012-07-11 DIAGNOSIS — I10 Essential (primary) hypertension: Secondary | ICD-10-CM | POA: Diagnosis not present

## 2012-07-11 DIAGNOSIS — Z1639 Resistance to other specified antimicrobial drug: Secondary | ICD-10-CM | POA: Diagnosis not present

## 2012-07-13 DIAGNOSIS — T8140XA Infection following a procedure, unspecified, initial encounter: Secondary | ICD-10-CM | POA: Diagnosis not present

## 2012-07-13 DIAGNOSIS — Z1639 Resistance to other specified antimicrobial drug: Secondary | ICD-10-CM | POA: Diagnosis not present

## 2012-07-13 DIAGNOSIS — Z433 Encounter for attention to colostomy: Secondary | ICD-10-CM | POA: Diagnosis not present

## 2012-07-13 DIAGNOSIS — I10 Essential (primary) hypertension: Secondary | ICD-10-CM | POA: Diagnosis not present

## 2012-07-14 DIAGNOSIS — Z433 Encounter for attention to colostomy: Secondary | ICD-10-CM | POA: Diagnosis not present

## 2012-07-14 DIAGNOSIS — Z1639 Resistance to other specified antimicrobial drug: Secondary | ICD-10-CM | POA: Diagnosis not present

## 2012-07-14 DIAGNOSIS — T8140XA Infection following a procedure, unspecified, initial encounter: Secondary | ICD-10-CM | POA: Diagnosis not present

## 2012-07-14 DIAGNOSIS — I10 Essential (primary) hypertension: Secondary | ICD-10-CM | POA: Diagnosis not present

## 2012-07-15 DIAGNOSIS — T8140XA Infection following a procedure, unspecified, initial encounter: Secondary | ICD-10-CM | POA: Diagnosis not present

## 2012-07-15 DIAGNOSIS — I10 Essential (primary) hypertension: Secondary | ICD-10-CM | POA: Diagnosis not present

## 2012-07-15 DIAGNOSIS — Z1639 Resistance to other specified antimicrobial drug: Secondary | ICD-10-CM | POA: Diagnosis not present

## 2012-07-15 DIAGNOSIS — Z433 Encounter for attention to colostomy: Secondary | ICD-10-CM | POA: Diagnosis not present

## 2012-07-18 DIAGNOSIS — T8140XA Infection following a procedure, unspecified, initial encounter: Secondary | ICD-10-CM | POA: Diagnosis not present

## 2012-07-18 DIAGNOSIS — Z1639 Resistance to other specified antimicrobial drug: Secondary | ICD-10-CM | POA: Diagnosis not present

## 2012-07-18 DIAGNOSIS — Z433 Encounter for attention to colostomy: Secondary | ICD-10-CM | POA: Diagnosis not present

## 2012-07-18 DIAGNOSIS — I10 Essential (primary) hypertension: Secondary | ICD-10-CM | POA: Diagnosis not present

## 2012-07-20 DIAGNOSIS — T8140XA Infection following a procedure, unspecified, initial encounter: Secondary | ICD-10-CM | POA: Diagnosis not present

## 2012-07-20 DIAGNOSIS — I10 Essential (primary) hypertension: Secondary | ICD-10-CM | POA: Diagnosis not present

## 2012-07-20 DIAGNOSIS — Z433 Encounter for attention to colostomy: Secondary | ICD-10-CM | POA: Diagnosis not present

## 2012-07-20 DIAGNOSIS — Z1639 Resistance to other specified antimicrobial drug: Secondary | ICD-10-CM | POA: Diagnosis not present

## 2012-07-21 DIAGNOSIS — M545 Low back pain: Secondary | ICD-10-CM | POA: Diagnosis not present

## 2012-07-22 DIAGNOSIS — Z1639 Resistance to other specified antimicrobial drug: Secondary | ICD-10-CM | POA: Diagnosis not present

## 2012-07-22 DIAGNOSIS — I10 Essential (primary) hypertension: Secondary | ICD-10-CM | POA: Diagnosis not present

## 2012-07-22 DIAGNOSIS — Z433 Encounter for attention to colostomy: Secondary | ICD-10-CM | POA: Diagnosis not present

## 2012-07-22 DIAGNOSIS — T8140XA Infection following a procedure, unspecified, initial encounter: Secondary | ICD-10-CM | POA: Diagnosis not present

## 2012-07-25 DIAGNOSIS — Z1639 Resistance to other specified antimicrobial drug: Secondary | ICD-10-CM | POA: Diagnosis not present

## 2012-07-25 DIAGNOSIS — Z433 Encounter for attention to colostomy: Secondary | ICD-10-CM | POA: Diagnosis not present

## 2012-07-25 DIAGNOSIS — T8140XA Infection following a procedure, unspecified, initial encounter: Secondary | ICD-10-CM | POA: Diagnosis not present

## 2012-07-25 DIAGNOSIS — I10 Essential (primary) hypertension: Secondary | ICD-10-CM | POA: Diagnosis not present

## 2012-07-27 DIAGNOSIS — I10 Essential (primary) hypertension: Secondary | ICD-10-CM | POA: Diagnosis not present

## 2012-07-27 DIAGNOSIS — Z1639 Resistance to other specified antimicrobial drug: Secondary | ICD-10-CM | POA: Diagnosis not present

## 2012-07-27 DIAGNOSIS — Z433 Encounter for attention to colostomy: Secondary | ICD-10-CM | POA: Diagnosis not present

## 2012-07-27 DIAGNOSIS — T8140XA Infection following a procedure, unspecified, initial encounter: Secondary | ICD-10-CM | POA: Diagnosis not present

## 2012-07-29 DIAGNOSIS — T8140XA Infection following a procedure, unspecified, initial encounter: Secondary | ICD-10-CM | POA: Diagnosis not present

## 2012-07-29 DIAGNOSIS — Z1639 Resistance to other specified antimicrobial drug: Secondary | ICD-10-CM | POA: Diagnosis not present

## 2012-07-29 DIAGNOSIS — I10 Essential (primary) hypertension: Secondary | ICD-10-CM | POA: Diagnosis not present

## 2012-07-29 DIAGNOSIS — Z433 Encounter for attention to colostomy: Secondary | ICD-10-CM | POA: Diagnosis not present

## 2012-08-01 DIAGNOSIS — E0500 Thyrotoxicosis with diffuse goiter without thyrotoxic crisis or storm: Secondary | ICD-10-CM | POA: Diagnosis not present

## 2012-08-01 DIAGNOSIS — T8140XA Infection following a procedure, unspecified, initial encounter: Secondary | ICD-10-CM | POA: Diagnosis not present

## 2012-08-01 DIAGNOSIS — Z1639 Resistance to other specified antimicrobial drug: Secondary | ICD-10-CM | POA: Diagnosis not present

## 2012-08-01 DIAGNOSIS — R636 Underweight: Secondary | ICD-10-CM | POA: Diagnosis not present

## 2012-08-01 DIAGNOSIS — Z433 Encounter for attention to colostomy: Secondary | ICD-10-CM | POA: Diagnosis not present

## 2012-08-01 DIAGNOSIS — I10 Essential (primary) hypertension: Secondary | ICD-10-CM | POA: Diagnosis not present

## 2012-08-03 DIAGNOSIS — Z1639 Resistance to other specified antimicrobial drug: Secondary | ICD-10-CM | POA: Diagnosis not present

## 2012-08-03 DIAGNOSIS — Z433 Encounter for attention to colostomy: Secondary | ICD-10-CM | POA: Diagnosis not present

## 2012-08-03 DIAGNOSIS — T8140XA Infection following a procedure, unspecified, initial encounter: Secondary | ICD-10-CM | POA: Diagnosis not present

## 2012-08-03 DIAGNOSIS — I10 Essential (primary) hypertension: Secondary | ICD-10-CM | POA: Diagnosis not present

## 2012-08-05 DIAGNOSIS — Z433 Encounter for attention to colostomy: Secondary | ICD-10-CM | POA: Diagnosis not present

## 2012-08-05 DIAGNOSIS — I10 Essential (primary) hypertension: Secondary | ICD-10-CM | POA: Diagnosis not present

## 2012-08-05 DIAGNOSIS — Z1639 Resistance to other specified antimicrobial drug: Secondary | ICD-10-CM | POA: Diagnosis not present

## 2012-08-05 DIAGNOSIS — T8140XA Infection following a procedure, unspecified, initial encounter: Secondary | ICD-10-CM | POA: Diagnosis not present

## 2012-08-08 DIAGNOSIS — I10 Essential (primary) hypertension: Secondary | ICD-10-CM | POA: Diagnosis not present

## 2012-08-08 DIAGNOSIS — T8140XA Infection following a procedure, unspecified, initial encounter: Secondary | ICD-10-CM | POA: Diagnosis not present

## 2012-08-08 DIAGNOSIS — Z433 Encounter for attention to colostomy: Secondary | ICD-10-CM | POA: Diagnosis not present

## 2012-08-08 DIAGNOSIS — Z1639 Resistance to other specified antimicrobial drug: Secondary | ICD-10-CM | POA: Diagnosis not present

## 2012-08-10 DIAGNOSIS — T8140XA Infection following a procedure, unspecified, initial encounter: Secondary | ICD-10-CM | POA: Diagnosis not present

## 2012-08-10 DIAGNOSIS — I10 Essential (primary) hypertension: Secondary | ICD-10-CM | POA: Diagnosis not present

## 2012-08-10 DIAGNOSIS — Z1639 Resistance to other specified antimicrobial drug: Secondary | ICD-10-CM | POA: Diagnosis not present

## 2012-08-10 DIAGNOSIS — Z433 Encounter for attention to colostomy: Secondary | ICD-10-CM | POA: Diagnosis not present

## 2012-08-12 DIAGNOSIS — I10 Essential (primary) hypertension: Secondary | ICD-10-CM | POA: Diagnosis not present

## 2012-08-12 DIAGNOSIS — Z433 Encounter for attention to colostomy: Secondary | ICD-10-CM | POA: Diagnosis not present

## 2012-08-12 DIAGNOSIS — T8140XA Infection following a procedure, unspecified, initial encounter: Secondary | ICD-10-CM | POA: Diagnosis not present

## 2012-08-12 DIAGNOSIS — Z1639 Resistance to other specified antimicrobial drug: Secondary | ICD-10-CM | POA: Diagnosis not present

## 2012-08-15 DIAGNOSIS — I10 Essential (primary) hypertension: Secondary | ICD-10-CM | POA: Diagnosis not present

## 2012-08-15 DIAGNOSIS — Z433 Encounter for attention to colostomy: Secondary | ICD-10-CM | POA: Diagnosis not present

## 2012-08-15 DIAGNOSIS — Z1639 Resistance to other specified antimicrobial drug: Secondary | ICD-10-CM | POA: Diagnosis not present

## 2012-08-15 DIAGNOSIS — T8140XA Infection following a procedure, unspecified, initial encounter: Secondary | ICD-10-CM | POA: Diagnosis not present

## 2012-08-17 DIAGNOSIS — Z1639 Resistance to other specified antimicrobial drug: Secondary | ICD-10-CM | POA: Diagnosis not present

## 2012-08-17 DIAGNOSIS — T8140XA Infection following a procedure, unspecified, initial encounter: Secondary | ICD-10-CM | POA: Diagnosis not present

## 2012-08-17 DIAGNOSIS — I10 Essential (primary) hypertension: Secondary | ICD-10-CM | POA: Diagnosis not present

## 2012-08-17 DIAGNOSIS — Z433 Encounter for attention to colostomy: Secondary | ICD-10-CM | POA: Diagnosis not present

## 2012-08-18 DIAGNOSIS — A491 Streptococcal infection, unspecified site: Secondary | ICD-10-CM | POA: Diagnosis not present

## 2012-08-18 DIAGNOSIS — K5732 Diverticulitis of large intestine without perforation or abscess without bleeding: Secondary | ICD-10-CM | POA: Diagnosis not present

## 2012-08-19 DIAGNOSIS — Z433 Encounter for attention to colostomy: Secondary | ICD-10-CM | POA: Diagnosis not present

## 2012-08-19 DIAGNOSIS — Z1639 Resistance to other specified antimicrobial drug: Secondary | ICD-10-CM | POA: Diagnosis not present

## 2012-08-19 DIAGNOSIS — T8140XA Infection following a procedure, unspecified, initial encounter: Secondary | ICD-10-CM | POA: Diagnosis not present

## 2012-08-19 DIAGNOSIS — I10 Essential (primary) hypertension: Secondary | ICD-10-CM | POA: Diagnosis not present

## 2012-08-22 DIAGNOSIS — I10 Essential (primary) hypertension: Secondary | ICD-10-CM | POA: Diagnosis not present

## 2012-08-22 DIAGNOSIS — T8140XA Infection following a procedure, unspecified, initial encounter: Secondary | ICD-10-CM | POA: Diagnosis not present

## 2012-08-22 DIAGNOSIS — Z433 Encounter for attention to colostomy: Secondary | ICD-10-CM | POA: Diagnosis not present

## 2012-08-22 DIAGNOSIS — Z1639 Resistance to other specified antimicrobial drug: Secondary | ICD-10-CM | POA: Diagnosis not present

## 2012-08-25 DIAGNOSIS — T8140XA Infection following a procedure, unspecified, initial encounter: Secondary | ICD-10-CM | POA: Diagnosis not present

## 2012-08-25 DIAGNOSIS — Z1639 Resistance to other specified antimicrobial drug: Secondary | ICD-10-CM | POA: Diagnosis not present

## 2012-08-25 DIAGNOSIS — I10 Essential (primary) hypertension: Secondary | ICD-10-CM | POA: Diagnosis not present

## 2012-08-25 DIAGNOSIS — Z433 Encounter for attention to colostomy: Secondary | ICD-10-CM | POA: Diagnosis not present

## 2012-09-13 DIAGNOSIS — Z0389 Encounter for observation for other suspected diseases and conditions ruled out: Secondary | ICD-10-CM | POA: Diagnosis not present

## 2012-09-16 DIAGNOSIS — K5289 Other specified noninfective gastroenteritis and colitis: Secondary | ICD-10-CM | POA: Diagnosis not present

## 2012-09-16 DIAGNOSIS — Z0389 Encounter for observation for other suspected diseases and conditions ruled out: Secondary | ICD-10-CM | POA: Diagnosis not present

## 2012-09-16 DIAGNOSIS — Z8719 Personal history of other diseases of the digestive system: Secondary | ICD-10-CM | POA: Diagnosis not present

## 2012-09-16 DIAGNOSIS — J988 Other specified respiratory disorders: Secondary | ICD-10-CM | POA: Diagnosis not present

## 2012-09-16 DIAGNOSIS — K660 Peritoneal adhesions (postprocedural) (postinfection): Secondary | ICD-10-CM | POA: Diagnosis present

## 2012-09-16 DIAGNOSIS — I10 Essential (primary) hypertension: Secondary | ICD-10-CM | POA: Diagnosis not present

## 2012-09-16 DIAGNOSIS — J9819 Other pulmonary collapse: Secondary | ICD-10-CM | POA: Diagnosis not present

## 2012-09-16 DIAGNOSIS — Z22322 Carrier or suspected carrier of Methicillin resistant Staphylococcus aureus: Secondary | ICD-10-CM | POA: Diagnosis not present

## 2012-09-16 DIAGNOSIS — Z433 Encounter for attention to colostomy: Secondary | ICD-10-CM | POA: Diagnosis not present

## 2012-09-16 DIAGNOSIS — Y9210 Unspecified residential institution as the place of occurrence of the external cause: Secondary | ICD-10-CM | POA: Diagnosis not present

## 2012-09-16 DIAGNOSIS — Y832 Surgical operation with anastomosis, bypass or graft as the cause of abnormal reaction of the patient, or of later complication, without mention of misadventure at the time of the procedure: Secondary | ICD-10-CM | POA: Diagnosis not present

## 2012-09-16 DIAGNOSIS — E039 Hypothyroidism, unspecified: Secondary | ICD-10-CM | POA: Diagnosis present

## 2012-09-16 DIAGNOSIS — R0902 Hypoxemia: Secondary | ICD-10-CM | POA: Diagnosis not present

## 2012-09-26 DIAGNOSIS — I10 Essential (primary) hypertension: Secondary | ICD-10-CM | POA: Diagnosis not present

## 2012-09-26 DIAGNOSIS — B952 Enterococcus as the cause of diseases classified elsewhere: Secondary | ICD-10-CM | POA: Diagnosis not present

## 2012-09-26 DIAGNOSIS — T8189XA Other complications of procedures, not elsewhere classified, initial encounter: Secondary | ICD-10-CM | POA: Diagnosis not present

## 2012-09-28 DIAGNOSIS — K5732 Diverticulitis of large intestine without perforation or abscess without bleeding: Secondary | ICD-10-CM | POA: Diagnosis not present

## 2012-09-28 DIAGNOSIS — L02219 Cutaneous abscess of trunk, unspecified: Secondary | ICD-10-CM | POA: Diagnosis not present

## 2012-09-30 DIAGNOSIS — T8189XA Other complications of procedures, not elsewhere classified, initial encounter: Secondary | ICD-10-CM | POA: Diagnosis not present

## 2012-09-30 DIAGNOSIS — B952 Enterococcus as the cause of diseases classified elsewhere: Secondary | ICD-10-CM | POA: Diagnosis not present

## 2012-09-30 DIAGNOSIS — I10 Essential (primary) hypertension: Secondary | ICD-10-CM | POA: Diagnosis not present

## 2012-10-03 DIAGNOSIS — B952 Enterococcus as the cause of diseases classified elsewhere: Secondary | ICD-10-CM | POA: Diagnosis not present

## 2012-10-03 DIAGNOSIS — T8189XA Other complications of procedures, not elsewhere classified, initial encounter: Secondary | ICD-10-CM | POA: Diagnosis not present

## 2012-10-03 DIAGNOSIS — I10 Essential (primary) hypertension: Secondary | ICD-10-CM | POA: Diagnosis not present

## 2012-10-05 DIAGNOSIS — B952 Enterococcus as the cause of diseases classified elsewhere: Secondary | ICD-10-CM | POA: Diagnosis not present

## 2012-10-05 DIAGNOSIS — I10 Essential (primary) hypertension: Secondary | ICD-10-CM | POA: Diagnosis not present

## 2012-10-05 DIAGNOSIS — T8189XA Other complications of procedures, not elsewhere classified, initial encounter: Secondary | ICD-10-CM | POA: Diagnosis not present

## 2012-10-06 DIAGNOSIS — R300 Dysuria: Secondary | ICD-10-CM | POA: Diagnosis not present

## 2012-10-06 DIAGNOSIS — K5732 Diverticulitis of large intestine without perforation or abscess without bleeding: Secondary | ICD-10-CM | POA: Diagnosis not present

## 2012-10-06 DIAGNOSIS — A491 Streptococcal infection, unspecified site: Secondary | ICD-10-CM | POA: Diagnosis not present

## 2012-10-07 DIAGNOSIS — T8189XA Other complications of procedures, not elsewhere classified, initial encounter: Secondary | ICD-10-CM | POA: Diagnosis not present

## 2012-10-07 DIAGNOSIS — I10 Essential (primary) hypertension: Secondary | ICD-10-CM | POA: Diagnosis not present

## 2012-10-07 DIAGNOSIS — B952 Enterococcus as the cause of diseases classified elsewhere: Secondary | ICD-10-CM | POA: Diagnosis not present

## 2012-10-10 DIAGNOSIS — I10 Essential (primary) hypertension: Secondary | ICD-10-CM | POA: Diagnosis not present

## 2012-10-10 DIAGNOSIS — T8189XA Other complications of procedures, not elsewhere classified, initial encounter: Secondary | ICD-10-CM | POA: Diagnosis not present

## 2012-10-10 DIAGNOSIS — B952 Enterococcus as the cause of diseases classified elsewhere: Secondary | ICD-10-CM | POA: Diagnosis not present

## 2012-10-12 DIAGNOSIS — T8189XA Other complications of procedures, not elsewhere classified, initial encounter: Secondary | ICD-10-CM | POA: Diagnosis not present

## 2012-10-14 DIAGNOSIS — I10 Essential (primary) hypertension: Secondary | ICD-10-CM | POA: Diagnosis not present

## 2012-10-14 DIAGNOSIS — T8189XA Other complications of procedures, not elsewhere classified, initial encounter: Secondary | ICD-10-CM | POA: Diagnosis not present

## 2012-10-14 DIAGNOSIS — B952 Enterococcus as the cause of diseases classified elsewhere: Secondary | ICD-10-CM | POA: Diagnosis not present

## 2012-10-17 DIAGNOSIS — B952 Enterococcus as the cause of diseases classified elsewhere: Secondary | ICD-10-CM | POA: Diagnosis not present

## 2012-10-17 DIAGNOSIS — I10 Essential (primary) hypertension: Secondary | ICD-10-CM | POA: Diagnosis not present

## 2012-10-17 DIAGNOSIS — T8189XA Other complications of procedures, not elsewhere classified, initial encounter: Secondary | ICD-10-CM | POA: Diagnosis not present

## 2012-10-19 DIAGNOSIS — I10 Essential (primary) hypertension: Secondary | ICD-10-CM | POA: Diagnosis not present

## 2012-10-19 DIAGNOSIS — T8189XA Other complications of procedures, not elsewhere classified, initial encounter: Secondary | ICD-10-CM | POA: Diagnosis not present

## 2012-10-19 DIAGNOSIS — B952 Enterococcus as the cause of diseases classified elsewhere: Secondary | ICD-10-CM | POA: Diagnosis not present

## 2012-10-21 DIAGNOSIS — I10 Essential (primary) hypertension: Secondary | ICD-10-CM | POA: Diagnosis not present

## 2012-10-21 DIAGNOSIS — B952 Enterococcus as the cause of diseases classified elsewhere: Secondary | ICD-10-CM | POA: Diagnosis not present

## 2012-10-21 DIAGNOSIS — T8189XA Other complications of procedures, not elsewhere classified, initial encounter: Secondary | ICD-10-CM | POA: Diagnosis not present

## 2012-10-24 DIAGNOSIS — I10 Essential (primary) hypertension: Secondary | ICD-10-CM | POA: Diagnosis not present

## 2012-10-24 DIAGNOSIS — T8189XA Other complications of procedures, not elsewhere classified, initial encounter: Secondary | ICD-10-CM | POA: Diagnosis not present

## 2012-10-24 DIAGNOSIS — B952 Enterococcus as the cause of diseases classified elsewhere: Secondary | ICD-10-CM | POA: Diagnosis not present

## 2012-10-26 DIAGNOSIS — T8189XA Other complications of procedures, not elsewhere classified, initial encounter: Secondary | ICD-10-CM | POA: Diagnosis not present

## 2012-10-26 DIAGNOSIS — I10 Essential (primary) hypertension: Secondary | ICD-10-CM | POA: Diagnosis not present

## 2012-10-26 DIAGNOSIS — B952 Enterococcus as the cause of diseases classified elsewhere: Secondary | ICD-10-CM | POA: Diagnosis not present

## 2012-10-27 DIAGNOSIS — T8189XA Other complications of procedures, not elsewhere classified, initial encounter: Secondary | ICD-10-CM | POA: Diagnosis not present

## 2012-10-28 DIAGNOSIS — I10 Essential (primary) hypertension: Secondary | ICD-10-CM | POA: Diagnosis not present

## 2012-10-28 DIAGNOSIS — T8189XA Other complications of procedures, not elsewhere classified, initial encounter: Secondary | ICD-10-CM | POA: Diagnosis not present

## 2012-10-28 DIAGNOSIS — B952 Enterococcus as the cause of diseases classified elsewhere: Secondary | ICD-10-CM | POA: Diagnosis not present

## 2012-10-31 DIAGNOSIS — T8189XA Other complications of procedures, not elsewhere classified, initial encounter: Secondary | ICD-10-CM | POA: Diagnosis not present

## 2012-10-31 DIAGNOSIS — B952 Enterococcus as the cause of diseases classified elsewhere: Secondary | ICD-10-CM | POA: Diagnosis not present

## 2012-10-31 DIAGNOSIS — I10 Essential (primary) hypertension: Secondary | ICD-10-CM | POA: Diagnosis not present

## 2012-11-02 DIAGNOSIS — B952 Enterococcus as the cause of diseases classified elsewhere: Secondary | ICD-10-CM | POA: Diagnosis not present

## 2012-11-02 DIAGNOSIS — I10 Essential (primary) hypertension: Secondary | ICD-10-CM | POA: Diagnosis not present

## 2012-11-02 DIAGNOSIS — T8189XA Other complications of procedures, not elsewhere classified, initial encounter: Secondary | ICD-10-CM | POA: Diagnosis not present

## 2012-11-04 DIAGNOSIS — T8189XA Other complications of procedures, not elsewhere classified, initial encounter: Secondary | ICD-10-CM | POA: Diagnosis not present

## 2012-11-04 DIAGNOSIS — I10 Essential (primary) hypertension: Secondary | ICD-10-CM | POA: Diagnosis not present

## 2012-11-04 DIAGNOSIS — B952 Enterococcus as the cause of diseases classified elsewhere: Secondary | ICD-10-CM | POA: Diagnosis not present

## 2012-11-07 DIAGNOSIS — T8189XA Other complications of procedures, not elsewhere classified, initial encounter: Secondary | ICD-10-CM | POA: Diagnosis not present

## 2012-11-07 DIAGNOSIS — I10 Essential (primary) hypertension: Secondary | ICD-10-CM | POA: Diagnosis not present

## 2012-11-07 DIAGNOSIS — B952 Enterococcus as the cause of diseases classified elsewhere: Secondary | ICD-10-CM | POA: Diagnosis not present

## 2012-11-09 DIAGNOSIS — I10 Essential (primary) hypertension: Secondary | ICD-10-CM | POA: Diagnosis not present

## 2012-11-09 DIAGNOSIS — T8189XA Other complications of procedures, not elsewhere classified, initial encounter: Secondary | ICD-10-CM | POA: Diagnosis not present

## 2012-11-09 DIAGNOSIS — B952 Enterococcus as the cause of diseases classified elsewhere: Secondary | ICD-10-CM | POA: Diagnosis not present

## 2012-11-11 DIAGNOSIS — I10 Essential (primary) hypertension: Secondary | ICD-10-CM | POA: Diagnosis not present

## 2012-11-11 DIAGNOSIS — T8189XA Other complications of procedures, not elsewhere classified, initial encounter: Secondary | ICD-10-CM | POA: Diagnosis not present

## 2012-11-11 DIAGNOSIS — B952 Enterococcus as the cause of diseases classified elsewhere: Secondary | ICD-10-CM | POA: Diagnosis not present

## 2012-11-14 DIAGNOSIS — B952 Enterococcus as the cause of diseases classified elsewhere: Secondary | ICD-10-CM | POA: Diagnosis not present

## 2012-11-14 DIAGNOSIS — I10 Essential (primary) hypertension: Secondary | ICD-10-CM | POA: Diagnosis not present

## 2012-11-14 DIAGNOSIS — T8189XA Other complications of procedures, not elsewhere classified, initial encounter: Secondary | ICD-10-CM | POA: Diagnosis not present

## 2012-11-16 DIAGNOSIS — I10 Essential (primary) hypertension: Secondary | ICD-10-CM | POA: Diagnosis not present

## 2012-11-16 DIAGNOSIS — B952 Enterococcus as the cause of diseases classified elsewhere: Secondary | ICD-10-CM | POA: Diagnosis not present

## 2012-11-16 DIAGNOSIS — T8189XA Other complications of procedures, not elsewhere classified, initial encounter: Secondary | ICD-10-CM | POA: Diagnosis not present

## 2012-11-18 DIAGNOSIS — I10 Essential (primary) hypertension: Secondary | ICD-10-CM | POA: Diagnosis not present

## 2012-11-18 DIAGNOSIS — T8189XA Other complications of procedures, not elsewhere classified, initial encounter: Secondary | ICD-10-CM | POA: Diagnosis not present

## 2012-11-18 DIAGNOSIS — B952 Enterococcus as the cause of diseases classified elsewhere: Secondary | ICD-10-CM | POA: Diagnosis not present

## 2012-11-21 DIAGNOSIS — T8189XA Other complications of procedures, not elsewhere classified, initial encounter: Secondary | ICD-10-CM | POA: Diagnosis not present

## 2012-11-21 DIAGNOSIS — I10 Essential (primary) hypertension: Secondary | ICD-10-CM | POA: Diagnosis not present

## 2012-11-21 DIAGNOSIS — B952 Enterococcus as the cause of diseases classified elsewhere: Secondary | ICD-10-CM | POA: Diagnosis not present

## 2012-11-23 DIAGNOSIS — B952 Enterococcus as the cause of diseases classified elsewhere: Secondary | ICD-10-CM | POA: Diagnosis not present

## 2012-11-23 DIAGNOSIS — I10 Essential (primary) hypertension: Secondary | ICD-10-CM | POA: Diagnosis not present

## 2012-11-23 DIAGNOSIS — T8189XA Other complications of procedures, not elsewhere classified, initial encounter: Secondary | ICD-10-CM | POA: Diagnosis not present

## 2012-11-25 DIAGNOSIS — T8189XA Other complications of procedures, not elsewhere classified, initial encounter: Secondary | ICD-10-CM | POA: Diagnosis not present

## 2012-11-25 DIAGNOSIS — I10 Essential (primary) hypertension: Secondary | ICD-10-CM | POA: Diagnosis not present

## 2012-11-28 DIAGNOSIS — T8189XA Other complications of procedures, not elsewhere classified, initial encounter: Secondary | ICD-10-CM | POA: Diagnosis not present

## 2012-11-28 DIAGNOSIS — I10 Essential (primary) hypertension: Secondary | ICD-10-CM | POA: Diagnosis not present

## 2012-11-30 DIAGNOSIS — I10 Essential (primary) hypertension: Secondary | ICD-10-CM | POA: Diagnosis not present

## 2012-11-30 DIAGNOSIS — T8189XA Other complications of procedures, not elsewhere classified, initial encounter: Secondary | ICD-10-CM | POA: Diagnosis not present

## 2012-12-02 DIAGNOSIS — I10 Essential (primary) hypertension: Secondary | ICD-10-CM | POA: Diagnosis not present

## 2012-12-02 DIAGNOSIS — T8189XA Other complications of procedures, not elsewhere classified, initial encounter: Secondary | ICD-10-CM | POA: Diagnosis not present

## 2012-12-05 DIAGNOSIS — I10 Essential (primary) hypertension: Secondary | ICD-10-CM | POA: Diagnosis not present

## 2012-12-05 DIAGNOSIS — T8189XA Other complications of procedures, not elsewhere classified, initial encounter: Secondary | ICD-10-CM | POA: Diagnosis not present

## 2012-12-07 DIAGNOSIS — T8189XA Other complications of procedures, not elsewhere classified, initial encounter: Secondary | ICD-10-CM | POA: Diagnosis not present

## 2012-12-07 DIAGNOSIS — I10 Essential (primary) hypertension: Secondary | ICD-10-CM | POA: Diagnosis not present

## 2012-12-08 DIAGNOSIS — E0500 Thyrotoxicosis with diffuse goiter without thyrotoxic crisis or storm: Secondary | ICD-10-CM | POA: Diagnosis not present

## 2012-12-08 DIAGNOSIS — IMO0002 Reserved for concepts with insufficient information to code with codable children: Secondary | ICD-10-CM | POA: Diagnosis not present

## 2012-12-08 DIAGNOSIS — Z23 Encounter for immunization: Secondary | ICD-10-CM | POA: Diagnosis not present

## 2012-12-08 DIAGNOSIS — Z1389 Encounter for screening for other disorder: Secondary | ICD-10-CM | POA: Diagnosis not present

## 2012-12-08 DIAGNOSIS — J301 Allergic rhinitis due to pollen: Secondary | ICD-10-CM | POA: Diagnosis not present

## 2012-12-09 DIAGNOSIS — I10 Essential (primary) hypertension: Secondary | ICD-10-CM | POA: Diagnosis not present

## 2012-12-09 DIAGNOSIS — T8189XA Other complications of procedures, not elsewhere classified, initial encounter: Secondary | ICD-10-CM | POA: Diagnosis not present

## 2012-12-14 DIAGNOSIS — R109 Unspecified abdominal pain: Secondary | ICD-10-CM | POA: Diagnosis not present

## 2012-12-14 DIAGNOSIS — K5730 Diverticulosis of large intestine without perforation or abscess without bleeding: Secondary | ICD-10-CM | POA: Diagnosis not present

## 2012-12-14 DIAGNOSIS — K8020 Calculus of gallbladder without cholecystitis without obstruction: Secondary | ICD-10-CM | POA: Diagnosis not present

## 2013-01-31 DIAGNOSIS — E782 Mixed hyperlipidemia: Secondary | ICD-10-CM | POA: Diagnosis not present

## 2013-01-31 DIAGNOSIS — Z23 Encounter for immunization: Secondary | ICD-10-CM | POA: Diagnosis not present

## 2013-01-31 DIAGNOSIS — I129 Hypertensive chronic kidney disease with stage 1 through stage 4 chronic kidney disease, or unspecified chronic kidney disease: Secondary | ICD-10-CM | POA: Diagnosis not present

## 2013-01-31 DIAGNOSIS — I10 Essential (primary) hypertension: Secondary | ICD-10-CM | POA: Diagnosis not present

## 2013-01-31 DIAGNOSIS — Z1389 Encounter for screening for other disorder: Secondary | ICD-10-CM | POA: Diagnosis not present

## 2013-02-06 DIAGNOSIS — Z1231 Encounter for screening mammogram for malignant neoplasm of breast: Secondary | ICD-10-CM | POA: Diagnosis not present

## 2013-03-13 DIAGNOSIS — H5200 Hypermetropia, unspecified eye: Secondary | ICD-10-CM | POA: Diagnosis not present

## 2013-03-13 DIAGNOSIS — H2589 Other age-related cataract: Secondary | ICD-10-CM | POA: Diagnosis not present

## 2013-03-13 DIAGNOSIS — H409 Unspecified glaucoma: Secondary | ICD-10-CM | POA: Diagnosis not present

## 2013-06-01 DIAGNOSIS — E0500 Thyrotoxicosis with diffuse goiter without thyrotoxic crisis or storm: Secondary | ICD-10-CM | POA: Diagnosis not present

## 2013-06-01 DIAGNOSIS — I10 Essential (primary) hypertension: Secondary | ICD-10-CM | POA: Diagnosis not present

## 2013-06-01 DIAGNOSIS — L918 Other hypertrophic disorders of the skin: Secondary | ICD-10-CM | POA: Diagnosis not present

## 2013-06-01 DIAGNOSIS — E782 Mixed hyperlipidemia: Secondary | ICD-10-CM | POA: Diagnosis not present

## 2013-06-09 DIAGNOSIS — I10 Essential (primary) hypertension: Secondary | ICD-10-CM | POA: Diagnosis not present

## 2013-06-09 DIAGNOSIS — E0500 Thyrotoxicosis with diffuse goiter without thyrotoxic crisis or storm: Secondary | ICD-10-CM | POA: Diagnosis not present

## 2013-06-09 DIAGNOSIS — L570 Actinic keratosis: Secondary | ICD-10-CM | POA: Diagnosis not present

## 2013-06-09 DIAGNOSIS — E782 Mixed hyperlipidemia: Secondary | ICD-10-CM | POA: Diagnosis not present

## 2013-06-26 DIAGNOSIS — L570 Actinic keratosis: Secondary | ICD-10-CM | POA: Diagnosis not present

## 2013-06-26 DIAGNOSIS — L821 Other seborrheic keratosis: Secondary | ICD-10-CM | POA: Diagnosis not present

## 2013-08-28 DIAGNOSIS — L570 Actinic keratosis: Secondary | ICD-10-CM | POA: Diagnosis not present

## 2013-08-28 DIAGNOSIS — C44721 Squamous cell carcinoma of skin of unspecified lower limb, including hip: Secondary | ICD-10-CM | POA: Diagnosis not present

## 2013-09-13 DIAGNOSIS — H4010X0 Unspecified open-angle glaucoma, stage unspecified: Secondary | ICD-10-CM | POA: Diagnosis not present

## 2014-02-02 DIAGNOSIS — H40003 Preglaucoma, unspecified, bilateral: Secondary | ICD-10-CM | POA: Diagnosis not present

## 2014-02-02 DIAGNOSIS — H2512 Age-related nuclear cataract, left eye: Secondary | ICD-10-CM | POA: Diagnosis not present

## 2014-02-02 DIAGNOSIS — H04123 Dry eye syndrome of bilateral lacrimal glands: Secondary | ICD-10-CM | POA: Diagnosis not present

## 2014-02-08 DIAGNOSIS — E0500 Thyrotoxicosis with diffuse goiter without thyrotoxic crisis or storm: Secondary | ICD-10-CM | POA: Diagnosis not present

## 2014-02-08 DIAGNOSIS — I10 Essential (primary) hypertension: Secondary | ICD-10-CM | POA: Diagnosis not present

## 2014-02-08 DIAGNOSIS — Z6826 Body mass index (BMI) 26.0-26.9, adult: Secondary | ICD-10-CM | POA: Diagnosis not present

## 2014-02-08 DIAGNOSIS — Z23 Encounter for immunization: Secondary | ICD-10-CM | POA: Diagnosis not present

## 2014-03-20 DIAGNOSIS — H269 Unspecified cataract: Secondary | ICD-10-CM | POA: Diagnosis not present

## 2014-03-20 DIAGNOSIS — Z7982 Long term (current) use of aspirin: Secondary | ICD-10-CM | POA: Diagnosis not present

## 2014-03-20 DIAGNOSIS — H409 Unspecified glaucoma: Secondary | ICD-10-CM | POA: Diagnosis not present

## 2014-03-20 DIAGNOSIS — H9190 Unspecified hearing loss, unspecified ear: Secondary | ICD-10-CM | POA: Diagnosis not present

## 2014-03-20 DIAGNOSIS — Z79899 Other long term (current) drug therapy: Secondary | ICD-10-CM | POA: Diagnosis not present

## 2014-03-20 DIAGNOSIS — I10 Essential (primary) hypertension: Secondary | ICD-10-CM | POA: Diagnosis not present

## 2014-03-20 DIAGNOSIS — H25812 Combined forms of age-related cataract, left eye: Secondary | ICD-10-CM | POA: Diagnosis not present

## 2014-03-20 DIAGNOSIS — E039 Hypothyroidism, unspecified: Secondary | ICD-10-CM | POA: Diagnosis not present

## 2014-03-20 DIAGNOSIS — R785 Finding of other psychotropic drug in blood: Secondary | ICD-10-CM | POA: Diagnosis not present

## 2014-04-11 DIAGNOSIS — J069 Acute upper respiratory infection, unspecified: Secondary | ICD-10-CM | POA: Diagnosis not present

## 2014-04-11 DIAGNOSIS — J039 Acute tonsillitis, unspecified: Secondary | ICD-10-CM | POA: Diagnosis not present

## 2014-05-01 DIAGNOSIS — E0590 Thyrotoxicosis, unspecified without thyrotoxic crisis or storm: Secondary | ICD-10-CM | POA: Diagnosis not present

## 2014-05-01 DIAGNOSIS — H409 Unspecified glaucoma: Secondary | ICD-10-CM | POA: Diagnosis not present

## 2014-05-01 DIAGNOSIS — Z87891 Personal history of nicotine dependence: Secondary | ICD-10-CM | POA: Diagnosis not present

## 2014-05-01 DIAGNOSIS — H259 Unspecified age-related cataract: Secondary | ICD-10-CM | POA: Diagnosis not present

## 2014-05-01 DIAGNOSIS — H25811 Combined forms of age-related cataract, right eye: Secondary | ICD-10-CM | POA: Diagnosis not present

## 2014-05-01 DIAGNOSIS — Z7982 Long term (current) use of aspirin: Secondary | ICD-10-CM | POA: Diagnosis not present

## 2014-05-01 DIAGNOSIS — Z79899 Other long term (current) drug therapy: Secondary | ICD-10-CM | POA: Diagnosis not present

## 2014-05-01 DIAGNOSIS — H2511 Age-related nuclear cataract, right eye: Secondary | ICD-10-CM | POA: Diagnosis not present

## 2014-05-01 DIAGNOSIS — I10 Essential (primary) hypertension: Secondary | ICD-10-CM | POA: Diagnosis not present

## 2014-05-01 DIAGNOSIS — J309 Allergic rhinitis, unspecified: Secondary | ICD-10-CM | POA: Diagnosis not present

## 2014-05-01 DIAGNOSIS — E785 Hyperlipidemia, unspecified: Secondary | ICD-10-CM | POA: Diagnosis not present

## 2014-05-08 DIAGNOSIS — B373 Candidiasis of vulva and vagina: Secondary | ICD-10-CM | POA: Diagnosis not present

## 2014-05-08 DIAGNOSIS — Z6827 Body mass index (BMI) 27.0-27.9, adult: Secondary | ICD-10-CM | POA: Diagnosis not present

## 2014-05-08 DIAGNOSIS — N898 Other specified noninflammatory disorders of vagina: Secondary | ICD-10-CM | POA: Diagnosis not present

## 2014-05-15 DIAGNOSIS — N898 Other specified noninflammatory disorders of vagina: Secondary | ICD-10-CM | POA: Diagnosis not present

## 2014-05-15 DIAGNOSIS — B373 Candidiasis of vulva and vagina: Secondary | ICD-10-CM | POA: Diagnosis not present

## 2014-05-15 DIAGNOSIS — Z6826 Body mass index (BMI) 26.0-26.9, adult: Secondary | ICD-10-CM | POA: Diagnosis not present

## 2014-05-24 DIAGNOSIS — Z6826 Body mass index (BMI) 26.0-26.9, adult: Secondary | ICD-10-CM | POA: Diagnosis not present

## 2014-05-24 DIAGNOSIS — J358 Other chronic diseases of tonsils and adenoids: Secondary | ICD-10-CM | POA: Diagnosis not present

## 2014-05-24 DIAGNOSIS — N760 Acute vaginitis: Secondary | ICD-10-CM | POA: Diagnosis not present

## 2014-05-30 DIAGNOSIS — N750 Cyst of Bartholin's gland: Secondary | ICD-10-CM | POA: Diagnosis not present

## 2014-05-30 DIAGNOSIS — Z6826 Body mass index (BMI) 26.0-26.9, adult: Secondary | ICD-10-CM | POA: Diagnosis not present

## 2014-05-30 DIAGNOSIS — N760 Acute vaginitis: Secondary | ICD-10-CM | POA: Diagnosis not present

## 2014-06-22 DIAGNOSIS — N952 Postmenopausal atrophic vaginitis: Secondary | ICD-10-CM | POA: Diagnosis not present

## 2014-06-22 DIAGNOSIS — N898 Other specified noninflammatory disorders of vagina: Secondary | ICD-10-CM | POA: Diagnosis not present

## 2014-07-18 DIAGNOSIS — B9689 Other specified bacterial agents as the cause of diseases classified elsewhere: Secondary | ICD-10-CM | POA: Diagnosis not present

## 2014-07-18 DIAGNOSIS — N952 Postmenopausal atrophic vaginitis: Secondary | ICD-10-CM | POA: Diagnosis not present

## 2014-07-18 DIAGNOSIS — N760 Acute vaginitis: Secondary | ICD-10-CM | POA: Diagnosis not present

## 2014-08-01 DIAGNOSIS — N952 Postmenopausal atrophic vaginitis: Secondary | ICD-10-CM | POA: Diagnosis not present

## 2014-08-01 DIAGNOSIS — N760 Acute vaginitis: Secondary | ICD-10-CM | POA: Diagnosis not present

## 2014-08-01 DIAGNOSIS — B9689 Other specified bacterial agents as the cause of diseases classified elsewhere: Secondary | ICD-10-CM | POA: Diagnosis not present

## 2014-10-19 DIAGNOSIS — L259 Unspecified contact dermatitis, unspecified cause: Secondary | ICD-10-CM | POA: Diagnosis not present

## 2014-11-09 DIAGNOSIS — L03011 Cellulitis of right finger: Secondary | ICD-10-CM | POA: Diagnosis not present

## 2014-11-09 DIAGNOSIS — E876 Hypokalemia: Secondary | ICD-10-CM | POA: Diagnosis not present

## 2014-11-09 DIAGNOSIS — I10 Essential (primary) hypertension: Secondary | ICD-10-CM | POA: Diagnosis not present

## 2014-11-09 DIAGNOSIS — Z87891 Personal history of nicotine dependence: Secondary | ICD-10-CM | POA: Diagnosis not present

## 2014-11-09 DIAGNOSIS — L03113 Cellulitis of right upper limb: Secondary | ICD-10-CM | POA: Diagnosis not present

## 2014-11-09 DIAGNOSIS — Z79899 Other long term (current) drug therapy: Secondary | ICD-10-CM | POA: Diagnosis not present

## 2014-11-09 DIAGNOSIS — Z23 Encounter for immunization: Secondary | ICD-10-CM | POA: Diagnosis not present

## 2014-11-09 DIAGNOSIS — M1000 Idiopathic gout, unspecified site: Secondary | ICD-10-CM | POA: Diagnosis not present

## 2014-11-16 DIAGNOSIS — R21 Rash and other nonspecific skin eruption: Secondary | ICD-10-CM | POA: Diagnosis not present

## 2014-11-16 DIAGNOSIS — L270 Generalized skin eruption due to drugs and medicaments taken internally: Secondary | ICD-10-CM | POA: Diagnosis not present

## 2014-11-19 DIAGNOSIS — Z6825 Body mass index (BMI) 25.0-25.9, adult: Secondary | ICD-10-CM | POA: Diagnosis not present

## 2014-11-19 DIAGNOSIS — M79644 Pain in right finger(s): Secondary | ICD-10-CM | POA: Diagnosis not present

## 2015-07-05 DIAGNOSIS — Z131 Encounter for screening for diabetes mellitus: Secondary | ICD-10-CM | POA: Diagnosis not present

## 2015-07-05 DIAGNOSIS — E785 Hyperlipidemia, unspecified: Secondary | ICD-10-CM | POA: Diagnosis not present

## 2015-07-05 DIAGNOSIS — E0590 Thyrotoxicosis, unspecified without thyrotoxic crisis or storm: Secondary | ICD-10-CM | POA: Diagnosis not present

## 2015-07-05 DIAGNOSIS — Z0000 Encounter for general adult medical examination without abnormal findings: Secondary | ICD-10-CM | POA: Diagnosis not present

## 2015-07-05 DIAGNOSIS — Z1211 Encounter for screening for malignant neoplasm of colon: Secondary | ICD-10-CM | POA: Diagnosis not present

## 2015-07-11 DIAGNOSIS — I081 Rheumatic disorders of both mitral and tricuspid valves: Secondary | ICD-10-CM | POA: Diagnosis not present

## 2015-07-11 DIAGNOSIS — Z8249 Family history of ischemic heart disease and other diseases of the circulatory system: Secondary | ICD-10-CM | POA: Diagnosis not present

## 2015-07-11 DIAGNOSIS — Z1231 Encounter for screening mammogram for malignant neoplasm of breast: Secondary | ICD-10-CM | POA: Diagnosis not present

## 2015-07-11 DIAGNOSIS — R0609 Other forms of dyspnea: Secondary | ICD-10-CM | POA: Diagnosis not present

## 2015-07-16 DIAGNOSIS — H26491 Other secondary cataract, right eye: Secondary | ICD-10-CM | POA: Diagnosis not present

## 2015-07-16 DIAGNOSIS — H26492 Other secondary cataract, left eye: Secondary | ICD-10-CM | POA: Diagnosis not present

## 2015-07-19 DIAGNOSIS — Z1389 Encounter for screening for other disorder: Secondary | ICD-10-CM | POA: Diagnosis not present

## 2015-07-19 DIAGNOSIS — Z9181 History of falling: Secondary | ICD-10-CM | POA: Diagnosis not present

## 2015-07-19 DIAGNOSIS — G20 Parkinson's disease: Secondary | ICD-10-CM | POA: Diagnosis not present

## 2015-07-19 DIAGNOSIS — R928 Other abnormal and inconclusive findings on diagnostic imaging of breast: Secondary | ICD-10-CM | POA: Diagnosis not present

## 2015-07-19 DIAGNOSIS — R490 Dysphonia: Secondary | ICD-10-CM | POA: Diagnosis not present

## 2015-07-19 DIAGNOSIS — Z139 Encounter for screening, unspecified: Secondary | ICD-10-CM | POA: Diagnosis not present

## 2015-07-19 DIAGNOSIS — Z0000 Encounter for general adult medical examination without abnormal findings: Secondary | ICD-10-CM | POA: Diagnosis not present

## 2015-07-19 DIAGNOSIS — E0590 Thyrotoxicosis, unspecified without thyrotoxic crisis or storm: Secondary | ICD-10-CM | POA: Diagnosis not present

## 2015-07-22 DIAGNOSIS — N6002 Solitary cyst of left breast: Secondary | ICD-10-CM | POA: Diagnosis not present

## 2015-07-22 DIAGNOSIS — N63 Unspecified lump in breast: Secondary | ICD-10-CM | POA: Diagnosis not present

## 2015-08-08 DIAGNOSIS — Z6825 Body mass index (BMI) 25.0-25.9, adult: Secondary | ICD-10-CM | POA: Diagnosis not present

## 2015-08-08 DIAGNOSIS — J449 Chronic obstructive pulmonary disease, unspecified: Secondary | ICD-10-CM | POA: Diagnosis not present

## 2015-08-12 DIAGNOSIS — J342 Deviated nasal septum: Secondary | ICD-10-CM | POA: Diagnosis not present

## 2015-08-12 DIAGNOSIS — J383 Other diseases of vocal cords: Secondary | ICD-10-CM | POA: Diagnosis not present

## 2015-08-12 DIAGNOSIS — J387 Other diseases of larynx: Secondary | ICD-10-CM | POA: Diagnosis not present

## 2015-08-12 DIAGNOSIS — R490 Dysphonia: Secondary | ICD-10-CM | POA: Diagnosis not present

## 2015-08-12 DIAGNOSIS — E042 Nontoxic multinodular goiter: Secondary | ICD-10-CM | POA: Diagnosis not present

## 2015-08-13 DIAGNOSIS — R262 Difficulty in walking, not elsewhere classified: Secondary | ICD-10-CM | POA: Diagnosis not present

## 2015-08-13 DIAGNOSIS — I10 Essential (primary) hypertension: Secondary | ICD-10-CM | POA: Diagnosis not present

## 2015-08-13 DIAGNOSIS — G20 Parkinson's disease: Secondary | ICD-10-CM | POA: Diagnosis not present

## 2015-08-13 DIAGNOSIS — M6281 Muscle weakness (generalized): Secondary | ICD-10-CM | POA: Diagnosis not present

## 2015-08-16 DIAGNOSIS — R262 Difficulty in walking, not elsewhere classified: Secondary | ICD-10-CM | POA: Diagnosis not present

## 2015-08-16 DIAGNOSIS — G20 Parkinson's disease: Secondary | ICD-10-CM | POA: Diagnosis not present

## 2015-08-16 DIAGNOSIS — M6281 Muscle weakness (generalized): Secondary | ICD-10-CM | POA: Diagnosis not present

## 2015-08-16 DIAGNOSIS — I10 Essential (primary) hypertension: Secondary | ICD-10-CM | POA: Diagnosis not present

## 2015-08-19 DIAGNOSIS — R262 Difficulty in walking, not elsewhere classified: Secondary | ICD-10-CM | POA: Diagnosis not present

## 2015-08-19 DIAGNOSIS — G20 Parkinson's disease: Secondary | ICD-10-CM | POA: Diagnosis not present

## 2015-08-19 DIAGNOSIS — I10 Essential (primary) hypertension: Secondary | ICD-10-CM | POA: Diagnosis not present

## 2015-08-19 DIAGNOSIS — M6281 Muscle weakness (generalized): Secondary | ICD-10-CM | POA: Diagnosis not present

## 2015-08-23 DIAGNOSIS — I10 Essential (primary) hypertension: Secondary | ICD-10-CM | POA: Diagnosis not present

## 2015-08-23 DIAGNOSIS — R262 Difficulty in walking, not elsewhere classified: Secondary | ICD-10-CM | POA: Diagnosis not present

## 2015-08-23 DIAGNOSIS — G20 Parkinson's disease: Secondary | ICD-10-CM | POA: Diagnosis not present

## 2015-08-23 DIAGNOSIS — M6281 Muscle weakness (generalized): Secondary | ICD-10-CM | POA: Diagnosis not present

## 2015-08-26 DIAGNOSIS — R262 Difficulty in walking, not elsewhere classified: Secondary | ICD-10-CM | POA: Diagnosis not present

## 2015-08-26 DIAGNOSIS — M6281 Muscle weakness (generalized): Secondary | ICD-10-CM | POA: Diagnosis not present

## 2015-08-26 DIAGNOSIS — I10 Essential (primary) hypertension: Secondary | ICD-10-CM | POA: Diagnosis not present

## 2015-08-26 DIAGNOSIS — G20 Parkinson's disease: Secondary | ICD-10-CM | POA: Diagnosis not present

## 2015-08-28 DIAGNOSIS — R262 Difficulty in walking, not elsewhere classified: Secondary | ICD-10-CM | POA: Diagnosis not present

## 2015-08-28 DIAGNOSIS — M6281 Muscle weakness (generalized): Secondary | ICD-10-CM | POA: Diagnosis not present

## 2015-08-28 DIAGNOSIS — G20 Parkinson's disease: Secondary | ICD-10-CM | POA: Diagnosis not present

## 2015-08-28 DIAGNOSIS — I10 Essential (primary) hypertension: Secondary | ICD-10-CM | POA: Diagnosis not present

## 2015-09-02 DIAGNOSIS — R262 Difficulty in walking, not elsewhere classified: Secondary | ICD-10-CM | POA: Diagnosis not present

## 2015-09-02 DIAGNOSIS — G20 Parkinson's disease: Secondary | ICD-10-CM | POA: Diagnosis not present

## 2015-09-02 DIAGNOSIS — I10 Essential (primary) hypertension: Secondary | ICD-10-CM | POA: Diagnosis not present

## 2015-09-02 DIAGNOSIS — M6281 Muscle weakness (generalized): Secondary | ICD-10-CM | POA: Diagnosis not present

## 2015-09-04 DIAGNOSIS — R262 Difficulty in walking, not elsewhere classified: Secondary | ICD-10-CM | POA: Diagnosis not present

## 2015-09-04 DIAGNOSIS — M6281 Muscle weakness (generalized): Secondary | ICD-10-CM | POA: Diagnosis not present

## 2015-09-04 DIAGNOSIS — I10 Essential (primary) hypertension: Secondary | ICD-10-CM | POA: Diagnosis not present

## 2015-09-04 DIAGNOSIS — G20 Parkinson's disease: Secondary | ICD-10-CM | POA: Diagnosis not present

## 2015-09-10 DIAGNOSIS — G20 Parkinson's disease: Secondary | ICD-10-CM | POA: Diagnosis not present

## 2015-09-10 DIAGNOSIS — M6281 Muscle weakness (generalized): Secondary | ICD-10-CM | POA: Diagnosis not present

## 2015-09-10 DIAGNOSIS — I10 Essential (primary) hypertension: Secondary | ICD-10-CM | POA: Diagnosis not present

## 2015-09-10 DIAGNOSIS — R262 Difficulty in walking, not elsewhere classified: Secondary | ICD-10-CM | POA: Diagnosis not present

## 2015-09-12 DIAGNOSIS — I10 Essential (primary) hypertension: Secondary | ICD-10-CM | POA: Diagnosis not present

## 2015-09-12 DIAGNOSIS — M6281 Muscle weakness (generalized): Secondary | ICD-10-CM | POA: Diagnosis not present

## 2015-09-12 DIAGNOSIS — G20 Parkinson's disease: Secondary | ICD-10-CM | POA: Diagnosis not present

## 2015-09-12 DIAGNOSIS — R262 Difficulty in walking, not elsewhere classified: Secondary | ICD-10-CM | POA: Diagnosis not present

## 2015-09-16 DIAGNOSIS — G20 Parkinson's disease: Secondary | ICD-10-CM | POA: Diagnosis not present

## 2015-09-16 DIAGNOSIS — R262 Difficulty in walking, not elsewhere classified: Secondary | ICD-10-CM | POA: Diagnosis not present

## 2015-09-16 DIAGNOSIS — I10 Essential (primary) hypertension: Secondary | ICD-10-CM | POA: Diagnosis not present

## 2015-09-16 DIAGNOSIS — M6281 Muscle weakness (generalized): Secondary | ICD-10-CM | POA: Diagnosis not present

## 2015-09-18 DIAGNOSIS — H409 Unspecified glaucoma: Secondary | ICD-10-CM | POA: Diagnosis not present

## 2015-09-18 DIAGNOSIS — E039 Hypothyroidism, unspecified: Secondary | ICD-10-CM | POA: Diagnosis not present

## 2015-09-18 DIAGNOSIS — J449 Chronic obstructive pulmonary disease, unspecified: Secondary | ICD-10-CM | POA: Diagnosis not present

## 2015-09-18 DIAGNOSIS — I10 Essential (primary) hypertension: Secondary | ICD-10-CM

## 2015-09-18 DIAGNOSIS — Z7982 Long term (current) use of aspirin: Secondary | ICD-10-CM | POA: Diagnosis not present

## 2015-09-18 DIAGNOSIS — E876 Hypokalemia: Secondary | ICD-10-CM | POA: Diagnosis not present

## 2015-09-18 DIAGNOSIS — R0602 Shortness of breath: Secondary | ICD-10-CM | POA: Diagnosis not present

## 2015-09-18 DIAGNOSIS — Z7951 Long term (current) use of inhaled steroids: Secondary | ICD-10-CM | POA: Diagnosis not present

## 2015-09-18 DIAGNOSIS — Z6824 Body mass index (BMI) 24.0-24.9, adult: Secondary | ICD-10-CM | POA: Diagnosis not present

## 2015-09-18 DIAGNOSIS — R002 Palpitations: Secondary | ICD-10-CM | POA: Diagnosis not present

## 2015-09-18 DIAGNOSIS — R0789 Other chest pain: Secondary | ICD-10-CM | POA: Diagnosis not present

## 2015-09-18 DIAGNOSIS — R531 Weakness: Secondary | ICD-10-CM | POA: Insufficient documentation

## 2015-09-18 DIAGNOSIS — G20 Parkinson's disease: Secondary | ICD-10-CM | POA: Diagnosis not present

## 2015-09-18 DIAGNOSIS — Z87891 Personal history of nicotine dependence: Secondary | ICD-10-CM | POA: Diagnosis not present

## 2015-09-18 DIAGNOSIS — Z79899 Other long term (current) drug therapy: Secondary | ICD-10-CM | POA: Diagnosis not present

## 2015-09-18 DIAGNOSIS — I493 Ventricular premature depolarization: Secondary | ICD-10-CM | POA: Diagnosis not present

## 2015-09-18 DIAGNOSIS — E0590 Thyrotoxicosis, unspecified without thyrotoxic crisis or storm: Secondary | ICD-10-CM | POA: Insufficient documentation

## 2015-09-18 HISTORY — DX: Essential (primary) hypertension: I10

## 2015-09-18 HISTORY — DX: Other chest pain: R07.89

## 2015-09-18 HISTORY — DX: Weakness: R53.1

## 2015-09-18 HISTORY — DX: Hypokalemia: E87.6

## 2015-09-19 DIAGNOSIS — E876 Hypokalemia: Secondary | ICD-10-CM | POA: Diagnosis not present

## 2015-09-19 DIAGNOSIS — R0789 Other chest pain: Secondary | ICD-10-CM | POA: Diagnosis not present

## 2015-09-19 DIAGNOSIS — I10 Essential (primary) hypertension: Secondary | ICD-10-CM | POA: Diagnosis not present

## 2015-09-19 DIAGNOSIS — R079 Chest pain, unspecified: Secondary | ICD-10-CM | POA: Diagnosis not present

## 2015-09-19 DIAGNOSIS — J449 Chronic obstructive pulmonary disease, unspecified: Secondary | ICD-10-CM | POA: Diagnosis not present

## 2015-09-20 DIAGNOSIS — J449 Chronic obstructive pulmonary disease, unspecified: Secondary | ICD-10-CM | POA: Diagnosis not present

## 2015-09-20 DIAGNOSIS — E876 Hypokalemia: Secondary | ICD-10-CM | POA: Diagnosis not present

## 2015-09-20 DIAGNOSIS — G20 Parkinson's disease: Secondary | ICD-10-CM | POA: Diagnosis not present

## 2015-09-20 DIAGNOSIS — Z6824 Body mass index (BMI) 24.0-24.9, adult: Secondary | ICD-10-CM | POA: Diagnosis not present

## 2015-09-25 DIAGNOSIS — M6281 Muscle weakness (generalized): Secondary | ICD-10-CM | POA: Diagnosis not present

## 2015-09-25 DIAGNOSIS — I10 Essential (primary) hypertension: Secondary | ICD-10-CM | POA: Diagnosis not present

## 2015-09-25 DIAGNOSIS — G20 Parkinson's disease: Secondary | ICD-10-CM | POA: Diagnosis not present

## 2015-09-25 DIAGNOSIS — R262 Difficulty in walking, not elsewhere classified: Secondary | ICD-10-CM | POA: Diagnosis not present

## 2015-10-01 DIAGNOSIS — M6281 Muscle weakness (generalized): Secondary | ICD-10-CM | POA: Diagnosis not present

## 2015-10-01 DIAGNOSIS — I10 Essential (primary) hypertension: Secondary | ICD-10-CM | POA: Diagnosis not present

## 2015-10-01 DIAGNOSIS — G20 Parkinson's disease: Secondary | ICD-10-CM | POA: Diagnosis not present

## 2015-10-01 DIAGNOSIS — R262 Difficulty in walking, not elsewhere classified: Secondary | ICD-10-CM | POA: Diagnosis not present

## 2015-10-03 DIAGNOSIS — I10 Essential (primary) hypertension: Secondary | ICD-10-CM | POA: Diagnosis not present

## 2015-10-03 DIAGNOSIS — R262 Difficulty in walking, not elsewhere classified: Secondary | ICD-10-CM | POA: Diagnosis not present

## 2015-10-03 DIAGNOSIS — M6281 Muscle weakness (generalized): Secondary | ICD-10-CM | POA: Diagnosis not present

## 2015-10-03 DIAGNOSIS — G20 Parkinson's disease: Secondary | ICD-10-CM | POA: Diagnosis not present

## 2015-10-07 DIAGNOSIS — M6281 Muscle weakness (generalized): Secondary | ICD-10-CM | POA: Diagnosis not present

## 2015-10-07 DIAGNOSIS — I10 Essential (primary) hypertension: Secondary | ICD-10-CM | POA: Diagnosis not present

## 2015-10-07 DIAGNOSIS — G20 Parkinson's disease: Secondary | ICD-10-CM | POA: Diagnosis not present

## 2015-10-07 DIAGNOSIS — R262 Difficulty in walking, not elsewhere classified: Secondary | ICD-10-CM | POA: Diagnosis not present

## 2015-10-08 DIAGNOSIS — Z6824 Body mass index (BMI) 24.0-24.9, adult: Secondary | ICD-10-CM | POA: Diagnosis not present

## 2015-10-08 DIAGNOSIS — J449 Chronic obstructive pulmonary disease, unspecified: Secondary | ICD-10-CM | POA: Diagnosis not present

## 2015-10-08 DIAGNOSIS — E876 Hypokalemia: Secondary | ICD-10-CM | POA: Diagnosis not present

## 2015-10-08 DIAGNOSIS — G20 Parkinson's disease: Secondary | ICD-10-CM | POA: Diagnosis not present

## 2015-10-09 DIAGNOSIS — R262 Difficulty in walking, not elsewhere classified: Secondary | ICD-10-CM | POA: Diagnosis not present

## 2015-10-09 DIAGNOSIS — G20 Parkinson's disease: Secondary | ICD-10-CM | POA: Diagnosis not present

## 2015-10-09 DIAGNOSIS — M6281 Muscle weakness (generalized): Secondary | ICD-10-CM | POA: Diagnosis not present

## 2015-10-09 DIAGNOSIS — I10 Essential (primary) hypertension: Secondary | ICD-10-CM | POA: Diagnosis not present

## 2015-10-14 DIAGNOSIS — G20 Parkinson's disease: Secondary | ICD-10-CM | POA: Diagnosis not present

## 2015-10-14 DIAGNOSIS — I10 Essential (primary) hypertension: Secondary | ICD-10-CM | POA: Diagnosis not present

## 2015-10-14 DIAGNOSIS — R262 Difficulty in walking, not elsewhere classified: Secondary | ICD-10-CM | POA: Diagnosis not present

## 2015-10-14 DIAGNOSIS — M6281 Muscle weakness (generalized): Secondary | ICD-10-CM | POA: Diagnosis not present

## 2015-10-18 DIAGNOSIS — M6281 Muscle weakness (generalized): Secondary | ICD-10-CM | POA: Diagnosis not present

## 2015-10-18 DIAGNOSIS — G20 Parkinson's disease: Secondary | ICD-10-CM | POA: Diagnosis not present

## 2015-10-18 DIAGNOSIS — R262 Difficulty in walking, not elsewhere classified: Secondary | ICD-10-CM | POA: Diagnosis not present

## 2015-10-18 DIAGNOSIS — J449 Chronic obstructive pulmonary disease, unspecified: Secondary | ICD-10-CM | POA: Diagnosis not present

## 2015-10-18 DIAGNOSIS — I10 Essential (primary) hypertension: Secondary | ICD-10-CM | POA: Diagnosis not present

## 2015-10-21 DIAGNOSIS — R634 Abnormal weight loss: Secondary | ICD-10-CM | POA: Diagnosis not present

## 2015-10-21 DIAGNOSIS — E0590 Thyrotoxicosis, unspecified without thyrotoxic crisis or storm: Secondary | ICD-10-CM | POA: Diagnosis not present

## 2015-10-21 DIAGNOSIS — F419 Anxiety disorder, unspecified: Secondary | ICD-10-CM | POA: Diagnosis not present

## 2015-10-21 DIAGNOSIS — R5383 Other fatigue: Secondary | ICD-10-CM | POA: Diagnosis not present

## 2015-10-21 DIAGNOSIS — G20 Parkinson's disease: Secondary | ICD-10-CM | POA: Diagnosis not present

## 2015-10-21 DIAGNOSIS — J449 Chronic obstructive pulmonary disease, unspecified: Secondary | ICD-10-CM | POA: Diagnosis not present

## 2015-11-20 DIAGNOSIS — G629 Polyneuropathy, unspecified: Secondary | ICD-10-CM | POA: Diagnosis not present

## 2015-11-20 DIAGNOSIS — G20 Parkinson's disease: Secondary | ICD-10-CM | POA: Diagnosis not present

## 2015-11-21 DIAGNOSIS — J449 Chronic obstructive pulmonary disease, unspecified: Secondary | ICD-10-CM | POA: Diagnosis not present

## 2015-11-21 DIAGNOSIS — E559 Vitamin D deficiency, unspecified: Secondary | ICD-10-CM | POA: Diagnosis not present

## 2015-11-21 DIAGNOSIS — F419 Anxiety disorder, unspecified: Secondary | ICD-10-CM | POA: Diagnosis not present

## 2015-11-21 DIAGNOSIS — Z23 Encounter for immunization: Secondary | ICD-10-CM | POA: Diagnosis not present

## 2015-11-21 DIAGNOSIS — I10 Essential (primary) hypertension: Secondary | ICD-10-CM | POA: Diagnosis not present

## 2015-11-21 DIAGNOSIS — E538 Deficiency of other specified B group vitamins: Secondary | ICD-10-CM | POA: Diagnosis not present

## 2015-11-25 DIAGNOSIS — G20 Parkinson's disease: Secondary | ICD-10-CM | POA: Diagnosis not present

## 2015-11-25 DIAGNOSIS — R29898 Other symptoms and signs involving the musculoskeletal system: Secondary | ICD-10-CM | POA: Diagnosis not present

## 2015-11-25 DIAGNOSIS — R200 Anesthesia of skin: Secondary | ICD-10-CM | POA: Diagnosis not present

## 2015-12-01 DIAGNOSIS — G20 Parkinson's disease: Secondary | ICD-10-CM

## 2015-12-01 DIAGNOSIS — E039 Hypothyroidism, unspecified: Secondary | ICD-10-CM | POA: Diagnosis not present

## 2015-12-01 DIAGNOSIS — F419 Anxiety disorder, unspecified: Secondary | ICD-10-CM | POA: Diagnosis not present

## 2015-12-01 DIAGNOSIS — I10 Essential (primary) hypertension: Secondary | ICD-10-CM | POA: Diagnosis not present

## 2015-12-01 DIAGNOSIS — F411 Generalized anxiety disorder: Secondary | ICD-10-CM | POA: Insufficient documentation

## 2015-12-01 DIAGNOSIS — Z886 Allergy status to analgesic agent status: Secondary | ICD-10-CM | POA: Diagnosis not present

## 2015-12-01 DIAGNOSIS — Z7982 Long term (current) use of aspirin: Secondary | ICD-10-CM | POA: Diagnosis not present

## 2015-12-01 DIAGNOSIS — Z7951 Long term (current) use of inhaled steroids: Secondary | ICD-10-CM | POA: Diagnosis not present

## 2015-12-01 DIAGNOSIS — J449 Chronic obstructive pulmonary disease, unspecified: Secondary | ICD-10-CM | POA: Diagnosis not present

## 2015-12-01 DIAGNOSIS — E785 Hyperlipidemia, unspecified: Secondary | ICD-10-CM | POA: Diagnosis not present

## 2015-12-01 DIAGNOSIS — Z87891 Personal history of nicotine dependence: Secondary | ICD-10-CM | POA: Diagnosis not present

## 2015-12-01 DIAGNOSIS — E876 Hypokalemia: Secondary | ICD-10-CM | POA: Diagnosis not present

## 2015-12-01 DIAGNOSIS — F430 Acute stress reaction: Secondary | ICD-10-CM | POA: Diagnosis not present

## 2015-12-01 DIAGNOSIS — H409 Unspecified glaucoma: Secondary | ICD-10-CM | POA: Diagnosis not present

## 2015-12-01 DIAGNOSIS — G20A1 Parkinson's disease without dyskinesia, without mention of fluctuations: Secondary | ICD-10-CM | POA: Insufficient documentation

## 2015-12-01 DIAGNOSIS — G4700 Insomnia, unspecified: Secondary | ICD-10-CM | POA: Diagnosis not present

## 2015-12-01 DIAGNOSIS — F5101 Primary insomnia: Secondary | ICD-10-CM | POA: Diagnosis not present

## 2015-12-01 DIAGNOSIS — F4325 Adjustment disorder with mixed disturbance of emotions and conduct: Secondary | ICD-10-CM | POA: Diagnosis not present

## 2015-12-01 DIAGNOSIS — Z6823 Body mass index (BMI) 23.0-23.9, adult: Secondary | ICD-10-CM | POA: Diagnosis not present

## 2015-12-01 HISTORY — DX: Primary insomnia: F51.01

## 2015-12-01 HISTORY — DX: Parkinson's disease: G20

## 2015-12-01 HISTORY — DX: Generalized anxiety disorder: F41.1

## 2015-12-01 HISTORY — DX: Parkinson's disease without dyskinesia, without mention of fluctuations: G20.A1

## 2015-12-05 DIAGNOSIS — M5416 Radiculopathy, lumbar region: Secondary | ICD-10-CM | POA: Diagnosis not present

## 2015-12-06 DIAGNOSIS — M5416 Radiculopathy, lumbar region: Secondary | ICD-10-CM | POA: Diagnosis not present

## 2015-12-06 DIAGNOSIS — Z6821 Body mass index (BMI) 21.0-21.9, adult: Secondary | ICD-10-CM | POA: Diagnosis not present

## 2015-12-06 DIAGNOSIS — G20 Parkinson's disease: Secondary | ICD-10-CM | POA: Diagnosis not present

## 2016-01-03 DIAGNOSIS — M5416 Radiculopathy, lumbar region: Secondary | ICD-10-CM | POA: Diagnosis not present

## 2016-01-03 DIAGNOSIS — G20 Parkinson's disease: Secondary | ICD-10-CM | POA: Diagnosis not present

## 2016-01-03 DIAGNOSIS — Z6822 Body mass index (BMI) 22.0-22.9, adult: Secondary | ICD-10-CM | POA: Diagnosis not present

## 2016-01-22 DIAGNOSIS — C44622 Squamous cell carcinoma of skin of right upper limb, including shoulder: Secondary | ICD-10-CM | POA: Diagnosis not present

## 2016-01-22 DIAGNOSIS — L570 Actinic keratosis: Secondary | ICD-10-CM | POA: Diagnosis not present

## 2016-01-22 DIAGNOSIS — L578 Other skin changes due to chronic exposure to nonionizing radiation: Secondary | ICD-10-CM | POA: Diagnosis not present

## 2016-01-22 DIAGNOSIS — L301 Dyshidrosis [pompholyx]: Secondary | ICD-10-CM | POA: Diagnosis not present

## 2016-01-23 DIAGNOSIS — G20 Parkinson's disease: Secondary | ICD-10-CM | POA: Diagnosis not present

## 2016-01-23 DIAGNOSIS — I10 Essential (primary) hypertension: Secondary | ICD-10-CM | POA: Diagnosis not present

## 2016-01-23 DIAGNOSIS — Z6824 Body mass index (BMI) 24.0-24.9, adult: Secondary | ICD-10-CM | POA: Diagnosis not present

## 2016-01-23 DIAGNOSIS — F419 Anxiety disorder, unspecified: Secondary | ICD-10-CM | POA: Diagnosis not present

## 2016-01-28 DIAGNOSIS — C44622 Squamous cell carcinoma of skin of right upper limb, including shoulder: Secondary | ICD-10-CM | POA: Diagnosis not present

## 2016-02-10 DIAGNOSIS — C44629 Squamous cell carcinoma of skin of left upper limb, including shoulder: Secondary | ICD-10-CM | POA: Diagnosis not present

## 2016-02-10 DIAGNOSIS — L570 Actinic keratosis: Secondary | ICD-10-CM | POA: Diagnosis not present

## 2016-02-13 DIAGNOSIS — C44629 Squamous cell carcinoma of skin of left upper limb, including shoulder: Secondary | ICD-10-CM | POA: Diagnosis not present

## 2016-03-02 DIAGNOSIS — C44629 Squamous cell carcinoma of skin of left upper limb, including shoulder: Secondary | ICD-10-CM | POA: Diagnosis not present

## 2016-03-19 DIAGNOSIS — F339 Major depressive disorder, recurrent, unspecified: Secondary | ICD-10-CM | POA: Diagnosis not present

## 2016-03-19 DIAGNOSIS — I10 Essential (primary) hypertension: Secondary | ICD-10-CM | POA: Diagnosis not present

## 2016-03-25 DIAGNOSIS — I10 Essential (primary) hypertension: Secondary | ICD-10-CM | POA: Diagnosis not present

## 2016-03-25 DIAGNOSIS — Z6826 Body mass index (BMI) 26.0-26.9, adult: Secondary | ICD-10-CM | POA: Diagnosis not present

## 2016-04-13 DIAGNOSIS — H01011 Ulcerative blepharitis right upper eyelid: Secondary | ICD-10-CM | POA: Diagnosis not present

## 2016-04-13 DIAGNOSIS — H01014 Ulcerative blepharitis left upper eyelid: Secondary | ICD-10-CM | POA: Diagnosis not present

## 2016-04-13 DIAGNOSIS — H01012 Ulcerative blepharitis right lower eyelid: Secondary | ICD-10-CM | POA: Diagnosis not present

## 2016-04-13 DIAGNOSIS — H01015 Ulcerative blepharitis left lower eyelid: Secondary | ICD-10-CM | POA: Diagnosis not present

## 2016-05-07 DIAGNOSIS — Z6827 Body mass index (BMI) 27.0-27.9, adult: Secondary | ICD-10-CM | POA: Diagnosis not present

## 2016-05-07 DIAGNOSIS — E559 Vitamin D deficiency, unspecified: Secondary | ICD-10-CM | POA: Diagnosis not present

## 2016-05-07 DIAGNOSIS — E538 Deficiency of other specified B group vitamins: Secondary | ICD-10-CM | POA: Diagnosis not present

## 2016-05-07 DIAGNOSIS — I10 Essential (primary) hypertension: Secondary | ICD-10-CM | POA: Diagnosis not present

## 2016-05-08 DIAGNOSIS — H01009 Unspecified blepharitis unspecified eye, unspecified eyelid: Secondary | ICD-10-CM | POA: Diagnosis not present

## 2016-05-21 DIAGNOSIS — Z6827 Body mass index (BMI) 27.0-27.9, adult: Secondary | ICD-10-CM | POA: Diagnosis not present

## 2016-05-21 DIAGNOSIS — I10 Essential (primary) hypertension: Secondary | ICD-10-CM | POA: Diagnosis not present

## 2016-05-21 DIAGNOSIS — S68119S Complete traumatic metacarpophalangeal amputation of unspecified finger, sequela: Secondary | ICD-10-CM | POA: Diagnosis not present

## 2016-06-05 DIAGNOSIS — H18891 Other specified disorders of cornea, right eye: Secondary | ICD-10-CM | POA: Diagnosis not present

## 2016-06-05 DIAGNOSIS — H00026 Hordeolum internum left eye, unspecified eyelid: Secondary | ICD-10-CM | POA: Diagnosis not present

## 2016-06-05 DIAGNOSIS — H01005 Unspecified blepharitis left lower eyelid: Secondary | ICD-10-CM | POA: Diagnosis not present

## 2016-06-05 DIAGNOSIS — H01002 Unspecified blepharitis right lower eyelid: Secondary | ICD-10-CM | POA: Diagnosis not present

## 2016-06-05 DIAGNOSIS — H01004 Unspecified blepharitis left upper eyelid: Secondary | ICD-10-CM | POA: Diagnosis not present

## 2016-06-05 DIAGNOSIS — H40013 Open angle with borderline findings, low risk, bilateral: Secondary | ICD-10-CM | POA: Diagnosis not present

## 2016-06-05 DIAGNOSIS — H00023 Hordeolum internum right eye, unspecified eyelid: Secondary | ICD-10-CM | POA: Diagnosis not present

## 2016-06-05 DIAGNOSIS — H01001 Unspecified blepharitis right upper eyelid: Secondary | ICD-10-CM | POA: Diagnosis not present

## 2016-06-05 DIAGNOSIS — H0289 Other specified disorders of eyelid: Secondary | ICD-10-CM | POA: Diagnosis not present

## 2016-06-15 DIAGNOSIS — L578 Other skin changes due to chronic exposure to nonionizing radiation: Secondary | ICD-10-CM | POA: Diagnosis not present

## 2016-06-15 DIAGNOSIS — L821 Other seborrheic keratosis: Secondary | ICD-10-CM | POA: Diagnosis not present

## 2016-06-15 DIAGNOSIS — L570 Actinic keratosis: Secondary | ICD-10-CM | POA: Diagnosis not present

## 2016-08-19 DIAGNOSIS — S68119S Complete traumatic metacarpophalangeal amputation of unspecified finger, sequela: Secondary | ICD-10-CM | POA: Diagnosis not present

## 2016-08-19 DIAGNOSIS — Z6828 Body mass index (BMI) 28.0-28.9, adult: Secondary | ICD-10-CM | POA: Diagnosis not present

## 2016-08-19 DIAGNOSIS — Z1389 Encounter for screening for other disorder: Secondary | ICD-10-CM | POA: Diagnosis not present

## 2016-08-19 DIAGNOSIS — I10 Essential (primary) hypertension: Secondary | ICD-10-CM | POA: Diagnosis not present

## 2016-08-19 DIAGNOSIS — F419 Anxiety disorder, unspecified: Secondary | ICD-10-CM | POA: Diagnosis not present

## 2016-08-19 DIAGNOSIS — F3160 Bipolar disorder, current episode mixed, unspecified: Secondary | ICD-10-CM | POA: Diagnosis not present

## 2016-08-19 DIAGNOSIS — Z139 Encounter for screening, unspecified: Secondary | ICD-10-CM | POA: Diagnosis not present

## 2016-08-25 DIAGNOSIS — H26491 Other secondary cataract, right eye: Secondary | ICD-10-CM | POA: Diagnosis not present

## 2016-09-17 DIAGNOSIS — H409 Unspecified glaucoma: Secondary | ICD-10-CM | POA: Diagnosis not present

## 2016-09-17 DIAGNOSIS — J302 Other seasonal allergic rhinitis: Secondary | ICD-10-CM | POA: Diagnosis not present

## 2016-09-17 DIAGNOSIS — S68119S Complete traumatic metacarpophalangeal amputation of unspecified finger, sequela: Secondary | ICD-10-CM | POA: Diagnosis not present

## 2016-09-17 DIAGNOSIS — J449 Chronic obstructive pulmonary disease, unspecified: Secondary | ICD-10-CM | POA: Diagnosis not present

## 2016-09-17 DIAGNOSIS — J305 Allergic rhinitis due to food: Secondary | ICD-10-CM | POA: Diagnosis not present

## 2016-09-18 DIAGNOSIS — J305 Allergic rhinitis due to food: Secondary | ICD-10-CM | POA: Diagnosis not present

## 2016-09-18 DIAGNOSIS — J302 Other seasonal allergic rhinitis: Secondary | ICD-10-CM | POA: Diagnosis not present

## 2016-09-21 DIAGNOSIS — J305 Allergic rhinitis due to food: Secondary | ICD-10-CM | POA: Diagnosis not present

## 2016-09-21 DIAGNOSIS — J302 Other seasonal allergic rhinitis: Secondary | ICD-10-CM | POA: Diagnosis not present

## 2016-09-22 DIAGNOSIS — J305 Allergic rhinitis due to food: Secondary | ICD-10-CM | POA: Diagnosis not present

## 2016-09-22 DIAGNOSIS — J302 Other seasonal allergic rhinitis: Secondary | ICD-10-CM | POA: Diagnosis not present

## 2016-09-23 DIAGNOSIS — J305 Allergic rhinitis due to food: Secondary | ICD-10-CM | POA: Diagnosis not present

## 2016-09-23 DIAGNOSIS — J302 Other seasonal allergic rhinitis: Secondary | ICD-10-CM | POA: Diagnosis not present

## 2016-09-24 DIAGNOSIS — J302 Other seasonal allergic rhinitis: Secondary | ICD-10-CM | POA: Diagnosis not present

## 2016-09-24 DIAGNOSIS — J305 Allergic rhinitis due to food: Secondary | ICD-10-CM | POA: Diagnosis not present

## 2016-09-25 DIAGNOSIS — J305 Allergic rhinitis due to food: Secondary | ICD-10-CM | POA: Diagnosis not present

## 2016-09-25 DIAGNOSIS — J302 Other seasonal allergic rhinitis: Secondary | ICD-10-CM | POA: Diagnosis not present

## 2016-09-28 DIAGNOSIS — J302 Other seasonal allergic rhinitis: Secondary | ICD-10-CM | POA: Diagnosis not present

## 2016-09-28 DIAGNOSIS — J305 Allergic rhinitis due to food: Secondary | ICD-10-CM | POA: Diagnosis not present

## 2016-12-09 DIAGNOSIS — H40013 Open angle with borderline findings, low risk, bilateral: Secondary | ICD-10-CM | POA: Diagnosis not present

## 2017-03-19 DIAGNOSIS — I10 Essential (primary) hypertension: Secondary | ICD-10-CM | POA: Diagnosis not present

## 2017-03-19 DIAGNOSIS — E785 Hyperlipidemia, unspecified: Secondary | ICD-10-CM | POA: Diagnosis not present

## 2017-03-19 DIAGNOSIS — E0590 Thyrotoxicosis, unspecified without thyrotoxic crisis or storm: Secondary | ICD-10-CM | POA: Diagnosis not present

## 2017-03-19 DIAGNOSIS — J449 Chronic obstructive pulmonary disease, unspecified: Secondary | ICD-10-CM | POA: Diagnosis not present

## 2017-04-05 DIAGNOSIS — L821 Other seborrheic keratosis: Secondary | ICD-10-CM | POA: Diagnosis not present

## 2017-04-05 DIAGNOSIS — L820 Inflamed seborrheic keratosis: Secondary | ICD-10-CM | POA: Diagnosis not present

## 2017-04-05 DIAGNOSIS — L219 Seborrheic dermatitis, unspecified: Secondary | ICD-10-CM | POA: Diagnosis not present

## 2017-04-05 DIAGNOSIS — L578 Other skin changes due to chronic exposure to nonionizing radiation: Secondary | ICD-10-CM | POA: Diagnosis not present

## 2017-04-05 DIAGNOSIS — L570 Actinic keratosis: Secondary | ICD-10-CM | POA: Diagnosis not present

## 2017-04-30 DIAGNOSIS — I10 Essential (primary) hypertension: Secondary | ICD-10-CM | POA: Diagnosis not present

## 2017-04-30 DIAGNOSIS — Z0000 Encounter for general adult medical examination without abnormal findings: Secondary | ICD-10-CM | POA: Diagnosis not present

## 2017-04-30 DIAGNOSIS — Z1331 Encounter for screening for depression: Secondary | ICD-10-CM | POA: Diagnosis not present

## 2017-04-30 DIAGNOSIS — Z139 Encounter for screening, unspecified: Secondary | ICD-10-CM | POA: Diagnosis not present

## 2017-06-04 DIAGNOSIS — S68119S Complete traumatic metacarpophalangeal amputation of unspecified finger, sequela: Secondary | ICD-10-CM | POA: Diagnosis not present

## 2017-06-04 DIAGNOSIS — F325 Major depressive disorder, single episode, in full remission: Secondary | ICD-10-CM | POA: Diagnosis not present

## 2017-06-04 DIAGNOSIS — I10 Essential (primary) hypertension: Secondary | ICD-10-CM | POA: Diagnosis not present

## 2017-06-04 DIAGNOSIS — E0590 Thyrotoxicosis, unspecified without thyrotoxic crisis or storm: Secondary | ICD-10-CM | POA: Diagnosis not present

## 2017-06-24 DIAGNOSIS — H04123 Dry eye syndrome of bilateral lacrimal glands: Secondary | ICD-10-CM | POA: Diagnosis not present

## 2017-06-24 DIAGNOSIS — H35371 Puckering of macula, right eye: Secondary | ICD-10-CM | POA: Diagnosis not present

## 2017-06-24 DIAGNOSIS — Z961 Presence of intraocular lens: Secondary | ICD-10-CM | POA: Diagnosis not present

## 2017-06-24 DIAGNOSIS — H401131 Primary open-angle glaucoma, bilateral, mild stage: Secondary | ICD-10-CM | POA: Diagnosis not present

## 2017-07-08 DIAGNOSIS — Z1231 Encounter for screening mammogram for malignant neoplasm of breast: Secondary | ICD-10-CM | POA: Diagnosis not present

## 2017-07-08 DIAGNOSIS — F325 Major depressive disorder, single episode, in full remission: Secondary | ICD-10-CM | POA: Diagnosis not present

## 2017-07-08 DIAGNOSIS — Z6828 Body mass index (BMI) 28.0-28.9, adult: Secondary | ICD-10-CM | POA: Diagnosis not present

## 2017-07-08 DIAGNOSIS — I10 Essential (primary) hypertension: Secondary | ICD-10-CM | POA: Diagnosis not present

## 2017-07-22 DIAGNOSIS — H35371 Puckering of macula, right eye: Secondary | ICD-10-CM | POA: Diagnosis not present

## 2017-08-12 DIAGNOSIS — F325 Major depressive disorder, single episode, in full remission: Secondary | ICD-10-CM | POA: Diagnosis not present

## 2017-08-12 DIAGNOSIS — I10 Essential (primary) hypertension: Secondary | ICD-10-CM | POA: Diagnosis not present

## 2017-08-12 DIAGNOSIS — J309 Allergic rhinitis, unspecified: Secondary | ICD-10-CM | POA: Diagnosis not present

## 2017-08-17 DIAGNOSIS — R928 Other abnormal and inconclusive findings on diagnostic imaging of breast: Secondary | ICD-10-CM | POA: Diagnosis not present

## 2017-08-17 DIAGNOSIS — Z1231 Encounter for screening mammogram for malignant neoplasm of breast: Secondary | ICD-10-CM | POA: Diagnosis not present

## 2017-09-09 DIAGNOSIS — R921 Mammographic calcification found on diagnostic imaging of breast: Secondary | ICD-10-CM | POA: Diagnosis not present

## 2017-09-09 DIAGNOSIS — R928 Other abnormal and inconclusive findings on diagnostic imaging of breast: Secondary | ICD-10-CM | POA: Diagnosis not present

## 2017-09-23 DIAGNOSIS — H35371 Puckering of macula, right eye: Secondary | ICD-10-CM | POA: Diagnosis not present

## 2017-09-24 ENCOUNTER — Other Ambulatory Visit: Payer: Self-pay

## 2017-10-18 DIAGNOSIS — F339 Major depressive disorder, recurrent, unspecified: Secondary | ICD-10-CM | POA: Diagnosis not present

## 2017-10-18 DIAGNOSIS — I10 Essential (primary) hypertension: Secondary | ICD-10-CM | POA: Diagnosis not present

## 2017-10-18 DIAGNOSIS — R3129 Other microscopic hematuria: Secondary | ICD-10-CM | POA: Diagnosis not present

## 2017-10-18 DIAGNOSIS — Z139 Encounter for screening, unspecified: Secondary | ICD-10-CM | POA: Diagnosis not present

## 2017-10-18 DIAGNOSIS — Z1331 Encounter for screening for depression: Secondary | ICD-10-CM | POA: Diagnosis not present

## 2017-10-18 DIAGNOSIS — F419 Anxiety disorder, unspecified: Secondary | ICD-10-CM | POA: Diagnosis not present

## 2017-10-27 DIAGNOSIS — Z6827 Body mass index (BMI) 27.0-27.9, adult: Secondary | ICD-10-CM | POA: Diagnosis not present

## 2017-10-27 DIAGNOSIS — T3695XA Adverse effect of unspecified systemic antibiotic, initial encounter: Secondary | ICD-10-CM | POA: Diagnosis not present

## 2017-10-27 DIAGNOSIS — B379 Candidiasis, unspecified: Secondary | ICD-10-CM | POA: Diagnosis not present

## 2017-10-27 DIAGNOSIS — N390 Urinary tract infection, site not specified: Secondary | ICD-10-CM | POA: Diagnosis not present

## 2017-11-01 DIAGNOSIS — Z23 Encounter for immunization: Secondary | ICD-10-CM | POA: Diagnosis not present

## 2017-11-01 DIAGNOSIS — I10 Essential (primary) hypertension: Secondary | ICD-10-CM | POA: Diagnosis not present

## 2017-11-01 DIAGNOSIS — Z139 Encounter for screening, unspecified: Secondary | ICD-10-CM | POA: Diagnosis not present

## 2017-11-01 DIAGNOSIS — B373 Candidiasis of vulva and vagina: Secondary | ICD-10-CM | POA: Diagnosis not present

## 2017-12-02 DIAGNOSIS — F339 Major depressive disorder, recurrent, unspecified: Secondary | ICD-10-CM | POA: Diagnosis not present

## 2017-12-02 DIAGNOSIS — E538 Deficiency of other specified B group vitamins: Secondary | ICD-10-CM | POA: Diagnosis not present

## 2017-12-02 DIAGNOSIS — E0590 Thyrotoxicosis, unspecified without thyrotoxic crisis or storm: Secondary | ICD-10-CM | POA: Diagnosis not present

## 2017-12-02 DIAGNOSIS — I10 Essential (primary) hypertension: Secondary | ICD-10-CM | POA: Diagnosis not present

## 2017-12-24 DIAGNOSIS — H40013 Open angle with borderline findings, low risk, bilateral: Secondary | ICD-10-CM | POA: Diagnosis not present

## 2017-12-30 DIAGNOSIS — H16202 Unspecified keratoconjunctivitis, left eye: Secondary | ICD-10-CM | POA: Diagnosis not present

## 2018-03-03 DIAGNOSIS — Z7189 Other specified counseling: Secondary | ICD-10-CM | POA: Diagnosis not present

## 2018-03-03 DIAGNOSIS — E785 Hyperlipidemia, unspecified: Secondary | ICD-10-CM | POA: Diagnosis not present

## 2018-03-03 DIAGNOSIS — J449 Chronic obstructive pulmonary disease, unspecified: Secondary | ICD-10-CM | POA: Diagnosis not present

## 2018-03-03 DIAGNOSIS — I10 Essential (primary) hypertension: Secondary | ICD-10-CM | POA: Diagnosis not present

## 2018-03-03 DIAGNOSIS — E0590 Thyrotoxicosis, unspecified without thyrotoxic crisis or storm: Secondary | ICD-10-CM | POA: Diagnosis not present

## 2018-03-30 DIAGNOSIS — R921 Mammographic calcification found on diagnostic imaging of breast: Secondary | ICD-10-CM | POA: Diagnosis not present

## 2018-04-05 DIAGNOSIS — L820 Inflamed seborrheic keratosis: Secondary | ICD-10-CM | POA: Diagnosis not present

## 2018-04-05 DIAGNOSIS — L821 Other seborrheic keratosis: Secondary | ICD-10-CM | POA: Diagnosis not present

## 2018-04-05 DIAGNOSIS — L578 Other skin changes due to chronic exposure to nonionizing radiation: Secondary | ICD-10-CM | POA: Diagnosis not present

## 2018-04-05 DIAGNOSIS — L570 Actinic keratosis: Secondary | ICD-10-CM | POA: Diagnosis not present

## 2018-05-20 DIAGNOSIS — B370 Candidal stomatitis: Secondary | ICD-10-CM | POA: Diagnosis not present

## 2018-05-20 DIAGNOSIS — J441 Chronic obstructive pulmonary disease with (acute) exacerbation: Secondary | ICD-10-CM | POA: Diagnosis not present

## 2018-05-20 DIAGNOSIS — I959 Hypotension, unspecified: Secondary | ICD-10-CM | POA: Diagnosis not present

## 2018-05-20 DIAGNOSIS — E876 Hypokalemia: Secondary | ICD-10-CM | POA: Diagnosis not present

## 2018-05-23 DIAGNOSIS — B370 Candidal stomatitis: Secondary | ICD-10-CM | POA: Diagnosis not present

## 2018-05-23 DIAGNOSIS — I959 Hypotension, unspecified: Secondary | ICD-10-CM | POA: Diagnosis not present

## 2018-05-23 DIAGNOSIS — F339 Major depressive disorder, recurrent, unspecified: Secondary | ICD-10-CM | POA: Diagnosis not present

## 2018-05-23 DIAGNOSIS — J441 Chronic obstructive pulmonary disease with (acute) exacerbation: Secondary | ICD-10-CM | POA: Diagnosis not present

## 2018-08-03 DIAGNOSIS — E785 Hyperlipidemia, unspecified: Secondary | ICD-10-CM | POA: Diagnosis not present

## 2018-08-03 DIAGNOSIS — E0590 Thyrotoxicosis, unspecified without thyrotoxic crisis or storm: Secondary | ICD-10-CM | POA: Diagnosis not present

## 2018-08-03 DIAGNOSIS — E538 Deficiency of other specified B group vitamins: Secondary | ICD-10-CM | POA: Diagnosis not present

## 2018-08-03 DIAGNOSIS — I10 Essential (primary) hypertension: Secondary | ICD-10-CM | POA: Diagnosis not present

## 2018-08-08 DIAGNOSIS — L570 Actinic keratosis: Secondary | ICD-10-CM | POA: Diagnosis not present

## 2018-08-08 DIAGNOSIS — L219 Seborrheic dermatitis, unspecified: Secondary | ICD-10-CM | POA: Diagnosis not present

## 2018-08-11 DIAGNOSIS — E785 Hyperlipidemia, unspecified: Secondary | ICD-10-CM | POA: Diagnosis not present

## 2018-08-11 DIAGNOSIS — Z1331 Encounter for screening for depression: Secondary | ICD-10-CM | POA: Diagnosis not present

## 2018-08-11 DIAGNOSIS — I10 Essential (primary) hypertension: Secondary | ICD-10-CM | POA: Diagnosis not present

## 2018-08-11 DIAGNOSIS — E0590 Thyrotoxicosis, unspecified without thyrotoxic crisis or storm: Secondary | ICD-10-CM | POA: Diagnosis not present

## 2018-08-11 DIAGNOSIS — Z0000 Encounter for general adult medical examination without abnormal findings: Secondary | ICD-10-CM | POA: Diagnosis not present

## 2018-08-11 DIAGNOSIS — J449 Chronic obstructive pulmonary disease, unspecified: Secondary | ICD-10-CM | POA: Diagnosis not present

## 2018-08-11 DIAGNOSIS — Z139 Encounter for screening, unspecified: Secondary | ICD-10-CM | POA: Diagnosis not present

## 2018-09-05 DIAGNOSIS — E2839 Other primary ovarian failure: Secondary | ICD-10-CM | POA: Diagnosis not present

## 2018-09-05 DIAGNOSIS — M85851 Other specified disorders of bone density and structure, right thigh: Secondary | ICD-10-CM | POA: Diagnosis not present

## 2018-09-06 DIAGNOSIS — H401131 Primary open-angle glaucoma, bilateral, mild stage: Secondary | ICD-10-CM | POA: Diagnosis not present

## 2018-09-06 DIAGNOSIS — Z961 Presence of intraocular lens: Secondary | ICD-10-CM | POA: Diagnosis not present

## 2018-09-15 DIAGNOSIS — R922 Inconclusive mammogram: Secondary | ICD-10-CM | POA: Diagnosis not present

## 2018-09-15 DIAGNOSIS — R928 Other abnormal and inconclusive findings on diagnostic imaging of breast: Secondary | ICD-10-CM | POA: Diagnosis not present

## 2018-10-10 DIAGNOSIS — H0015 Chalazion left lower eyelid: Secondary | ICD-10-CM | POA: Diagnosis not present

## 2018-10-10 DIAGNOSIS — H0012 Chalazion right lower eyelid: Secondary | ICD-10-CM | POA: Diagnosis not present

## 2018-12-01 DIAGNOSIS — I10 Essential (primary) hypertension: Secondary | ICD-10-CM | POA: Diagnosis not present

## 2018-12-01 DIAGNOSIS — E0590 Thyrotoxicosis, unspecified without thyrotoxic crisis or storm: Secondary | ICD-10-CM | POA: Diagnosis not present

## 2018-12-01 DIAGNOSIS — E785 Hyperlipidemia, unspecified: Secondary | ICD-10-CM | POA: Diagnosis not present

## 2018-12-08 DIAGNOSIS — I10 Essential (primary) hypertension: Secondary | ICD-10-CM | POA: Diagnosis not present

## 2018-12-08 DIAGNOSIS — M542 Cervicalgia: Secondary | ICD-10-CM | POA: Diagnosis not present

## 2018-12-08 DIAGNOSIS — E785 Hyperlipidemia, unspecified: Secondary | ICD-10-CM | POA: Diagnosis not present

## 2018-12-08 DIAGNOSIS — E0590 Thyrotoxicosis, unspecified without thyrotoxic crisis or storm: Secondary | ICD-10-CM | POA: Diagnosis not present

## 2018-12-08 DIAGNOSIS — Z23 Encounter for immunization: Secondary | ICD-10-CM | POA: Diagnosis not present

## 2018-12-14 DIAGNOSIS — M7581 Other shoulder lesions, right shoulder: Secondary | ICD-10-CM | POA: Diagnosis not present

## 2018-12-14 DIAGNOSIS — M545 Low back pain: Secondary | ICD-10-CM | POA: Diagnosis not present

## 2018-12-14 DIAGNOSIS — M25562 Pain in left knee: Secondary | ICD-10-CM | POA: Diagnosis not present

## 2018-12-14 DIAGNOSIS — M25511 Pain in right shoulder: Secondary | ICD-10-CM | POA: Diagnosis not present

## 2018-12-14 DIAGNOSIS — M25611 Stiffness of right shoulder, not elsewhere classified: Secondary | ICD-10-CM | POA: Diagnosis not present

## 2018-12-14 DIAGNOSIS — R531 Weakness: Secondary | ICD-10-CM | POA: Diagnosis not present

## 2018-12-14 DIAGNOSIS — M2560 Stiffness of unspecified joint, not elsewhere classified: Secondary | ICD-10-CM | POA: Diagnosis not present

## 2018-12-15 DIAGNOSIS — M545 Low back pain: Secondary | ICD-10-CM | POA: Diagnosis not present

## 2018-12-15 DIAGNOSIS — R531 Weakness: Secondary | ICD-10-CM | POA: Diagnosis not present

## 2018-12-15 DIAGNOSIS — M7581 Other shoulder lesions, right shoulder: Secondary | ICD-10-CM | POA: Diagnosis not present

## 2018-12-15 DIAGNOSIS — M25611 Stiffness of right shoulder, not elsewhere classified: Secondary | ICD-10-CM | POA: Diagnosis not present

## 2018-12-15 DIAGNOSIS — M25511 Pain in right shoulder: Secondary | ICD-10-CM | POA: Diagnosis not present

## 2018-12-15 DIAGNOSIS — M25562 Pain in left knee: Secondary | ICD-10-CM | POA: Diagnosis not present

## 2018-12-15 DIAGNOSIS — M2560 Stiffness of unspecified joint, not elsewhere classified: Secondary | ICD-10-CM | POA: Diagnosis not present

## 2018-12-19 DIAGNOSIS — R531 Weakness: Secondary | ICD-10-CM | POA: Diagnosis not present

## 2018-12-19 DIAGNOSIS — M25511 Pain in right shoulder: Secondary | ICD-10-CM | POA: Diagnosis not present

## 2018-12-19 DIAGNOSIS — M7581 Other shoulder lesions, right shoulder: Secondary | ICD-10-CM | POA: Diagnosis not present

## 2018-12-19 DIAGNOSIS — M2560 Stiffness of unspecified joint, not elsewhere classified: Secondary | ICD-10-CM | POA: Diagnosis not present

## 2018-12-19 DIAGNOSIS — M25562 Pain in left knee: Secondary | ICD-10-CM | POA: Diagnosis not present

## 2018-12-19 DIAGNOSIS — M25611 Stiffness of right shoulder, not elsewhere classified: Secondary | ICD-10-CM | POA: Diagnosis not present

## 2018-12-19 DIAGNOSIS — M545 Low back pain: Secondary | ICD-10-CM | POA: Diagnosis not present

## 2018-12-21 DIAGNOSIS — M545 Low back pain: Secondary | ICD-10-CM | POA: Diagnosis not present

## 2018-12-21 DIAGNOSIS — M25511 Pain in right shoulder: Secondary | ICD-10-CM | POA: Diagnosis not present

## 2018-12-21 DIAGNOSIS — M7581 Other shoulder lesions, right shoulder: Secondary | ICD-10-CM | POA: Diagnosis not present

## 2018-12-21 DIAGNOSIS — M25611 Stiffness of right shoulder, not elsewhere classified: Secondary | ICD-10-CM | POA: Diagnosis not present

## 2018-12-21 DIAGNOSIS — M25562 Pain in left knee: Secondary | ICD-10-CM | POA: Diagnosis not present

## 2018-12-21 DIAGNOSIS — M2560 Stiffness of unspecified joint, not elsewhere classified: Secondary | ICD-10-CM | POA: Diagnosis not present

## 2018-12-21 DIAGNOSIS — R531 Weakness: Secondary | ICD-10-CM | POA: Diagnosis not present

## 2018-12-26 DIAGNOSIS — M2560 Stiffness of unspecified joint, not elsewhere classified: Secondary | ICD-10-CM | POA: Diagnosis not present

## 2018-12-26 DIAGNOSIS — M545 Low back pain: Secondary | ICD-10-CM | POA: Diagnosis not present

## 2018-12-26 DIAGNOSIS — M25511 Pain in right shoulder: Secondary | ICD-10-CM | POA: Diagnosis not present

## 2018-12-26 DIAGNOSIS — M25611 Stiffness of right shoulder, not elsewhere classified: Secondary | ICD-10-CM | POA: Diagnosis not present

## 2018-12-26 DIAGNOSIS — R531 Weakness: Secondary | ICD-10-CM | POA: Diagnosis not present

## 2018-12-26 DIAGNOSIS — M25562 Pain in left knee: Secondary | ICD-10-CM | POA: Diagnosis not present

## 2018-12-26 DIAGNOSIS — M7581 Other shoulder lesions, right shoulder: Secondary | ICD-10-CM | POA: Diagnosis not present

## 2018-12-28 DIAGNOSIS — M25611 Stiffness of right shoulder, not elsewhere classified: Secondary | ICD-10-CM | POA: Diagnosis not present

## 2018-12-28 DIAGNOSIS — M2560 Stiffness of unspecified joint, not elsewhere classified: Secondary | ICD-10-CM | POA: Diagnosis not present

## 2018-12-28 DIAGNOSIS — M545 Low back pain: Secondary | ICD-10-CM | POA: Diagnosis not present

## 2018-12-28 DIAGNOSIS — M25562 Pain in left knee: Secondary | ICD-10-CM | POA: Diagnosis not present

## 2018-12-28 DIAGNOSIS — R531 Weakness: Secondary | ICD-10-CM | POA: Diagnosis not present

## 2018-12-28 DIAGNOSIS — M25511 Pain in right shoulder: Secondary | ICD-10-CM | POA: Diagnosis not present

## 2018-12-28 DIAGNOSIS — M7581 Other shoulder lesions, right shoulder: Secondary | ICD-10-CM | POA: Diagnosis not present

## 2019-01-03 DIAGNOSIS — M25511 Pain in right shoulder: Secondary | ICD-10-CM | POA: Diagnosis not present

## 2019-01-03 DIAGNOSIS — M25562 Pain in left knee: Secondary | ICD-10-CM | POA: Diagnosis not present

## 2019-01-03 DIAGNOSIS — M545 Low back pain: Secondary | ICD-10-CM | POA: Diagnosis not present

## 2019-01-03 DIAGNOSIS — M25611 Stiffness of right shoulder, not elsewhere classified: Secondary | ICD-10-CM | POA: Diagnosis not present

## 2019-01-03 DIAGNOSIS — M2560 Stiffness of unspecified joint, not elsewhere classified: Secondary | ICD-10-CM | POA: Diagnosis not present

## 2019-01-03 DIAGNOSIS — M7581 Other shoulder lesions, right shoulder: Secondary | ICD-10-CM | POA: Diagnosis not present

## 2019-01-03 DIAGNOSIS — R531 Weakness: Secondary | ICD-10-CM | POA: Diagnosis not present

## 2019-01-05 DIAGNOSIS — R531 Weakness: Secondary | ICD-10-CM | POA: Diagnosis not present

## 2019-01-05 DIAGNOSIS — M545 Low back pain: Secondary | ICD-10-CM | POA: Diagnosis not present

## 2019-01-05 DIAGNOSIS — M2560 Stiffness of unspecified joint, not elsewhere classified: Secondary | ICD-10-CM | POA: Diagnosis not present

## 2019-01-05 DIAGNOSIS — M25511 Pain in right shoulder: Secondary | ICD-10-CM | POA: Diagnosis not present

## 2019-01-05 DIAGNOSIS — M7581 Other shoulder lesions, right shoulder: Secondary | ICD-10-CM | POA: Diagnosis not present

## 2019-01-05 DIAGNOSIS — M25562 Pain in left knee: Secondary | ICD-10-CM | POA: Diagnosis not present

## 2019-01-05 DIAGNOSIS — M25611 Stiffness of right shoulder, not elsewhere classified: Secondary | ICD-10-CM | POA: Diagnosis not present

## 2019-01-11 DIAGNOSIS — M25611 Stiffness of right shoulder, not elsewhere classified: Secondary | ICD-10-CM | POA: Diagnosis not present

## 2019-01-11 DIAGNOSIS — M7581 Other shoulder lesions, right shoulder: Secondary | ICD-10-CM | POA: Diagnosis not present

## 2019-01-11 DIAGNOSIS — M545 Low back pain: Secondary | ICD-10-CM | POA: Diagnosis not present

## 2019-01-11 DIAGNOSIS — R531 Weakness: Secondary | ICD-10-CM | POA: Diagnosis not present

## 2019-01-11 DIAGNOSIS — M25511 Pain in right shoulder: Secondary | ICD-10-CM | POA: Diagnosis not present

## 2019-01-11 DIAGNOSIS — M2560 Stiffness of unspecified joint, not elsewhere classified: Secondary | ICD-10-CM | POA: Diagnosis not present

## 2019-01-11 DIAGNOSIS — M25562 Pain in left knee: Secondary | ICD-10-CM | POA: Diagnosis not present

## 2019-01-13 DIAGNOSIS — M25511 Pain in right shoulder: Secondary | ICD-10-CM | POA: Diagnosis not present

## 2019-01-13 DIAGNOSIS — M25562 Pain in left knee: Secondary | ICD-10-CM | POA: Diagnosis not present

## 2019-01-13 DIAGNOSIS — R531 Weakness: Secondary | ICD-10-CM | POA: Diagnosis not present

## 2019-01-13 DIAGNOSIS — M7581 Other shoulder lesions, right shoulder: Secondary | ICD-10-CM | POA: Diagnosis not present

## 2019-01-13 DIAGNOSIS — M545 Low back pain: Secondary | ICD-10-CM | POA: Diagnosis not present

## 2019-01-13 DIAGNOSIS — M25611 Stiffness of right shoulder, not elsewhere classified: Secondary | ICD-10-CM | POA: Diagnosis not present

## 2019-01-13 DIAGNOSIS — M2560 Stiffness of unspecified joint, not elsewhere classified: Secondary | ICD-10-CM | POA: Diagnosis not present

## 2019-03-03 DIAGNOSIS — C792 Secondary malignant neoplasm of skin: Secondary | ICD-10-CM | POA: Diagnosis not present

## 2019-03-03 DIAGNOSIS — Z6828 Body mass index (BMI) 28.0-28.9, adult: Secondary | ICD-10-CM | POA: Diagnosis not present

## 2019-03-06 DIAGNOSIS — J90 Pleural effusion, not elsewhere classified: Secondary | ICD-10-CM | POA: Diagnosis not present

## 2019-03-06 DIAGNOSIS — C792 Secondary malignant neoplasm of skin: Secondary | ICD-10-CM | POA: Diagnosis not present

## 2019-03-06 DIAGNOSIS — I700 Atherosclerosis of aorta: Secondary | ICD-10-CM | POA: Diagnosis not present

## 2019-03-06 DIAGNOSIS — K469 Unspecified abdominal hernia without obstruction or gangrene: Secondary | ICD-10-CM | POA: Diagnosis not present

## 2019-03-06 DIAGNOSIS — C4499 Other specified malignant neoplasm of skin, unspecified: Secondary | ICD-10-CM | POA: Diagnosis not present

## 2019-03-06 DIAGNOSIS — D7389 Other diseases of spleen: Secondary | ICD-10-CM | POA: Diagnosis not present

## 2019-03-06 DIAGNOSIS — I2510 Atherosclerotic heart disease of native coronary artery without angina pectoris: Secondary | ICD-10-CM | POA: Diagnosis not present

## 2019-03-08 DIAGNOSIS — C774 Secondary and unspecified malignant neoplasm of inguinal and lower limb lymph nodes: Secondary | ICD-10-CM | POA: Diagnosis not present

## 2019-03-08 DIAGNOSIS — C786 Secondary malignant neoplasm of retroperitoneum and peritoneum: Secondary | ICD-10-CM | POA: Diagnosis not present

## 2019-03-08 DIAGNOSIS — C772 Secondary and unspecified malignant neoplasm of intra-abdominal lymph nodes: Secondary | ICD-10-CM | POA: Diagnosis not present

## 2019-03-08 DIAGNOSIS — C561 Malignant neoplasm of right ovary: Secondary | ICD-10-CM | POA: Diagnosis not present

## 2019-03-08 DIAGNOSIS — C562 Malignant neoplasm of left ovary: Secondary | ICD-10-CM | POA: Diagnosis not present

## 2019-03-08 DIAGNOSIS — D649 Anemia, unspecified: Secondary | ICD-10-CM | POA: Diagnosis not present

## 2019-03-08 DIAGNOSIS — C569 Malignant neoplasm of unspecified ovary: Secondary | ICD-10-CM | POA: Diagnosis not present

## 2019-03-09 DIAGNOSIS — C569 Malignant neoplasm of unspecified ovary: Secondary | ICD-10-CM | POA: Diagnosis not present

## 2019-03-13 DIAGNOSIS — Z79899 Other long term (current) drug therapy: Secondary | ICD-10-CM | POA: Diagnosis not present

## 2019-03-13 DIAGNOSIS — E039 Hypothyroidism, unspecified: Secondary | ICD-10-CM | POA: Diagnosis not present

## 2019-03-13 DIAGNOSIS — H409 Unspecified glaucoma: Secondary | ICD-10-CM | POA: Diagnosis not present

## 2019-03-13 DIAGNOSIS — Z87891 Personal history of nicotine dependence: Secondary | ICD-10-CM | POA: Diagnosis not present

## 2019-03-13 DIAGNOSIS — J302 Other seasonal allergic rhinitis: Secondary | ICD-10-CM | POA: Diagnosis not present

## 2019-03-13 DIAGNOSIS — C569 Malignant neoplasm of unspecified ovary: Secondary | ICD-10-CM | POA: Diagnosis not present

## 2019-03-13 DIAGNOSIS — Z8719 Personal history of other diseases of the digestive system: Secondary | ICD-10-CM | POA: Diagnosis not present

## 2019-03-13 DIAGNOSIS — Z452 Encounter for adjustment and management of vascular access device: Secondary | ICD-10-CM | POA: Diagnosis not present

## 2019-03-13 DIAGNOSIS — I10 Essential (primary) hypertension: Secondary | ICD-10-CM | POA: Diagnosis not present

## 2019-03-13 DIAGNOSIS — E785 Hyperlipidemia, unspecified: Secondary | ICD-10-CM | POA: Diagnosis not present

## 2019-03-15 DIAGNOSIS — C569 Malignant neoplasm of unspecified ovary: Secondary | ICD-10-CM | POA: Diagnosis not present

## 2019-03-15 DIAGNOSIS — Z5111 Encounter for antineoplastic chemotherapy: Secondary | ICD-10-CM | POA: Diagnosis not present

## 2019-03-16 DIAGNOSIS — C569 Malignant neoplasm of unspecified ovary: Secondary | ICD-10-CM | POA: Diagnosis not present

## 2019-03-16 DIAGNOSIS — Z5189 Encounter for other specified aftercare: Secondary | ICD-10-CM | POA: Diagnosis not present

## 2019-03-17 DIAGNOSIS — R079 Chest pain, unspecified: Secondary | ICD-10-CM | POA: Diagnosis not present

## 2019-03-17 DIAGNOSIS — E785 Hyperlipidemia, unspecified: Secondary | ICD-10-CM | POA: Diagnosis not present

## 2019-03-17 DIAGNOSIS — I361 Nonrheumatic tricuspid (valve) insufficiency: Secondary | ICD-10-CM | POA: Diagnosis not present

## 2019-03-17 DIAGNOSIS — Z7982 Long term (current) use of aspirin: Secondary | ICD-10-CM | POA: Diagnosis not present

## 2019-03-17 DIAGNOSIS — F419 Anxiety disorder, unspecified: Secondary | ICD-10-CM | POA: Diagnosis not present

## 2019-03-17 DIAGNOSIS — I10 Essential (primary) hypertension: Secondary | ICD-10-CM | POA: Diagnosis not present

## 2019-03-17 DIAGNOSIS — R778 Other specified abnormalities of plasma proteins: Secondary | ICD-10-CM | POA: Diagnosis not present

## 2019-03-17 DIAGNOSIS — I480 Paroxysmal atrial fibrillation: Secondary | ICD-10-CM | POA: Diagnosis not present

## 2019-03-17 DIAGNOSIS — E7800 Pure hypercholesterolemia, unspecified: Secondary | ICD-10-CM | POA: Diagnosis not present

## 2019-03-17 DIAGNOSIS — Z888 Allergy status to other drugs, medicaments and biological substances status: Secondary | ICD-10-CM | POA: Diagnosis not present

## 2019-03-17 DIAGNOSIS — M1990 Unspecified osteoarthritis, unspecified site: Secondary | ICD-10-CM | POA: Diagnosis not present

## 2019-03-17 DIAGNOSIS — Z87891 Personal history of nicotine dependence: Secondary | ICD-10-CM | POA: Diagnosis not present

## 2019-03-17 DIAGNOSIS — E876 Hypokalemia: Secondary | ICD-10-CM | POA: Diagnosis not present

## 2019-03-17 DIAGNOSIS — I4891 Unspecified atrial fibrillation: Secondary | ICD-10-CM | POA: Diagnosis not present

## 2019-03-17 DIAGNOSIS — Z79899 Other long term (current) drug therapy: Secondary | ICD-10-CM | POA: Diagnosis not present

## 2019-03-17 DIAGNOSIS — C569 Malignant neoplasm of unspecified ovary: Secondary | ICD-10-CM | POA: Diagnosis not present

## 2019-03-17 DIAGNOSIS — I208 Other forms of angina pectoris: Secondary | ICD-10-CM | POA: Diagnosis not present

## 2019-03-18 DIAGNOSIS — I10 Essential (primary) hypertension: Secondary | ICD-10-CM | POA: Diagnosis not present

## 2019-03-18 DIAGNOSIS — I4891 Unspecified atrial fibrillation: Secondary | ICD-10-CM | POA: Diagnosis not present

## 2019-03-18 DIAGNOSIS — I480 Paroxysmal atrial fibrillation: Secondary | ICD-10-CM | POA: Diagnosis not present

## 2019-03-18 DIAGNOSIS — C569 Malignant neoplasm of unspecified ovary: Secondary | ICD-10-CM | POA: Diagnosis not present

## 2019-03-22 DIAGNOSIS — C569 Malignant neoplasm of unspecified ovary: Secondary | ICD-10-CM | POA: Diagnosis not present

## 2019-03-22 DIAGNOSIS — Z09 Encounter for follow-up examination after completed treatment for conditions other than malignant neoplasm: Secondary | ICD-10-CM | POA: Insufficient documentation

## 2019-03-23 ENCOUNTER — Telehealth: Payer: Self-pay | Admitting: Cardiology

## 2019-03-23 NOTE — Telephone Encounter (Signed)
Patient's daughter following up

## 2019-03-23 NOTE — Telephone Encounter (Signed)
Patient's daughter, [name], states while patient was in the hospital she saw and was advised by Dr. Geraldo Pitter to hold BP medications and she would like to inquire whether patient is eligible to take medications again or not. Please call and advise.   Pt c/o swelling: STAT is pt has developed SOB within 24 hours  1) How much weight have you gained and in what time span? No weight gain  2) If swelling, where is the swelling located? Stomach, ankles  Are you currently taking a fluid pill?   No  3) Are you currently SOB? No  4) Do you have a log of your daily weights (if so, list)? No  5) Have you gained 3 pounds in a day or 5 pounds in a week? No  6) Have you traveled recently? No  Pt c/o BP issue:  1. What are your last 5 BP readings?   153/69 118/63 102/50  2. Are you having any other symptoms (ex. Dizziness, headache, blurred vision, passed out)? Dizziness and headaches  3. What is your BP issue? BP has been low

## 2019-03-23 NOTE — Telephone Encounter (Signed)
Needs records of my hospital encounter

## 2019-03-24 DIAGNOSIS — I4891 Unspecified atrial fibrillation: Secondary | ICD-10-CM

## 2019-03-24 DIAGNOSIS — M79671 Pain in right foot: Secondary | ICD-10-CM | POA: Diagnosis not present

## 2019-03-24 DIAGNOSIS — I10 Essential (primary) hypertension: Secondary | ICD-10-CM | POA: Diagnosis not present

## 2019-03-24 DIAGNOSIS — F419 Anxiety disorder, unspecified: Secondary | ICD-10-CM | POA: Insufficient documentation

## 2019-03-24 DIAGNOSIS — J168 Pneumonia due to other specified infectious organisms: Secondary | ICD-10-CM | POA: Diagnosis not present

## 2019-03-24 DIAGNOSIS — H409 Unspecified glaucoma: Secondary | ICD-10-CM | POA: Diagnosis not present

## 2019-03-24 DIAGNOSIS — E0590 Thyrotoxicosis, unspecified without thyrotoxic crisis or storm: Secondary | ICD-10-CM | POA: Diagnosis not present

## 2019-03-24 DIAGNOSIS — Z20822 Contact with and (suspected) exposure to covid-19: Secondary | ICD-10-CM | POA: Diagnosis not present

## 2019-03-24 DIAGNOSIS — J810 Acute pulmonary edema: Secondary | ICD-10-CM | POA: Diagnosis not present

## 2019-03-24 DIAGNOSIS — R0602 Shortness of breath: Secondary | ICD-10-CM | POA: Diagnosis not present

## 2019-03-24 DIAGNOSIS — R918 Other nonspecific abnormal finding of lung field: Secondary | ICD-10-CM | POA: Diagnosis not present

## 2019-03-24 DIAGNOSIS — E8770 Fluid overload, unspecified: Secondary | ICD-10-CM | POA: Diagnosis not present

## 2019-03-24 DIAGNOSIS — I480 Paroxysmal atrial fibrillation: Secondary | ICD-10-CM | POA: Diagnosis not present

## 2019-03-24 DIAGNOSIS — J9621 Acute and chronic respiratory failure with hypoxia: Secondary | ICD-10-CM | POA: Diagnosis not present

## 2019-03-24 DIAGNOSIS — J189 Pneumonia, unspecified organism: Secondary | ICD-10-CM

## 2019-03-24 DIAGNOSIS — E871 Hypo-osmolality and hyponatremia: Secondary | ICD-10-CM | POA: Diagnosis not present

## 2019-03-24 DIAGNOSIS — C569 Malignant neoplasm of unspecified ovary: Secondary | ICD-10-CM | POA: Diagnosis not present

## 2019-03-24 DIAGNOSIS — J440 Chronic obstructive pulmonary disease with acute lower respiratory infection: Secondary | ICD-10-CM | POA: Diagnosis not present

## 2019-03-24 DIAGNOSIS — M79672 Pain in left foot: Secondary | ICD-10-CM | POA: Diagnosis not present

## 2019-03-24 HISTORY — DX: Unspecified atrial fibrillation: I48.91

## 2019-03-24 HISTORY — DX: Pneumonia, unspecified organism: J18.9

## 2019-03-24 MED ORDER — NITROGLYCERIN 0.4 MG SL SUBL
0.40 | SUBLINGUAL_TABLET | SUBLINGUAL | Status: DC
Start: ? — End: 2019-03-24

## 2019-03-24 MED ORDER — ATORVASTATIN CALCIUM 40 MG PO TABS
40.00 | ORAL_TABLET | ORAL | Status: DC
Start: 2019-03-27 — End: 2019-03-24

## 2019-03-24 MED ORDER — SODIUM CHLORIDE 0.9 % IV SOLN
50.00 | INTRAVENOUS | Status: DC
Start: ? — End: 2019-03-24

## 2019-03-24 MED ORDER — LATANOPROST 0.005 % OP SOLN
1.00 | OPHTHALMIC | Status: DC
Start: 2019-03-24 — End: 2019-03-24

## 2019-03-24 MED ORDER — DILTIAZEM HCL 30 MG PO TABS
30.00 | ORAL_TABLET | ORAL | Status: DC
Start: 2019-03-24 — End: 2019-03-24

## 2019-03-24 MED ORDER — GENERIC EXTERNAL MEDICATION
500.00 | Status: DC
Start: 2019-03-25 — End: 2019-03-24

## 2019-03-24 MED ORDER — ENOXAPARIN SODIUM 40 MG/0.4ML ~~LOC~~ SOLN
40.00 | SUBCUTANEOUS | Status: DC
Start: 2019-03-25 — End: 2019-03-24

## 2019-03-24 MED ORDER — IPRATROPIUM-ALBUTEROL 0.5-2.5 (3) MG/3ML IN SOLN
3.00 | RESPIRATORY_TRACT | Status: DC
Start: ? — End: 2019-03-24

## 2019-03-24 MED ORDER — METHIMAZOLE 5 MG PO TABS
2.50 | ORAL_TABLET | ORAL | Status: DC
Start: 2019-03-25 — End: 2019-03-24

## 2019-03-24 MED ORDER — POTASSIUM CHLORIDE 10 MEQ/100ML IV SOLN
10.00 | INTRAVENOUS | Status: DC
Start: 2019-03-24 — End: 2019-03-24

## 2019-03-24 MED ORDER — ONDANSETRON HCL 4 MG/2ML IJ SOLN
4.00 | INTRAMUSCULAR | Status: DC
Start: ? — End: 2019-03-24

## 2019-03-24 MED ORDER — GENERIC EXTERNAL MEDICATION
1.00 | Status: DC
Start: 2019-03-25 — End: 2019-03-24

## 2019-03-24 MED ORDER — ASPIRIN 81 MG PO TBEC
81.00 | DELAYED_RELEASE_TABLET | ORAL | Status: DC
Start: 2019-03-25 — End: 2019-03-24

## 2019-03-24 MED ORDER — ACETAMINOPHEN 325 MG PO TABS
650.00 | ORAL_TABLET | ORAL | Status: DC
Start: ? — End: 2019-03-24

## 2019-03-24 MED ORDER — DULOXETINE HCL 20 MG PO CPEP
20.00 | ORAL_CAPSULE | ORAL | Status: DC
Start: 2019-03-25 — End: 2019-03-24

## 2019-03-25 DIAGNOSIS — E0590 Thyrotoxicosis, unspecified without thyrotoxic crisis or storm: Secondary | ICD-10-CM | POA: Diagnosis present

## 2019-03-25 DIAGNOSIS — Z87891 Personal history of nicotine dependence: Secondary | ICD-10-CM | POA: Diagnosis not present

## 2019-03-25 DIAGNOSIS — J168 Pneumonia due to other specified infectious organisms: Secondary | ICD-10-CM | POA: Diagnosis present

## 2019-03-25 DIAGNOSIS — H409 Unspecified glaucoma: Secondary | ICD-10-CM | POA: Diagnosis present

## 2019-03-25 DIAGNOSIS — I959 Hypotension, unspecified: Secondary | ICD-10-CM | POA: Diagnosis present

## 2019-03-25 DIAGNOSIS — Z888 Allergy status to other drugs, medicaments and biological substances status: Secondary | ICD-10-CM | POA: Diagnosis not present

## 2019-03-25 DIAGNOSIS — Z886 Allergy status to analgesic agent status: Secondary | ICD-10-CM | POA: Diagnosis not present

## 2019-03-25 DIAGNOSIS — Z20822 Contact with and (suspected) exposure to covid-19: Secondary | ICD-10-CM | POA: Diagnosis present

## 2019-03-25 DIAGNOSIS — F419 Anxiety disorder, unspecified: Secondary | ICD-10-CM | POA: Diagnosis present

## 2019-03-25 DIAGNOSIS — I4891 Unspecified atrial fibrillation: Secondary | ICD-10-CM | POA: Diagnosis present

## 2019-03-25 DIAGNOSIS — E876 Hypokalemia: Secondary | ICD-10-CM | POA: Diagnosis present

## 2019-03-25 DIAGNOSIS — J810 Acute pulmonary edema: Secondary | ICD-10-CM | POA: Diagnosis present

## 2019-03-25 DIAGNOSIS — J9621 Acute and chronic respiratory failure with hypoxia: Secondary | ICD-10-CM

## 2019-03-25 DIAGNOSIS — R0602 Shortness of breath: Secondary | ICD-10-CM | POA: Diagnosis not present

## 2019-03-25 DIAGNOSIS — J440 Chronic obstructive pulmonary disease with acute lower respiratory infection: Secondary | ICD-10-CM | POA: Diagnosis present

## 2019-03-25 DIAGNOSIS — J9811 Atelectasis: Secondary | ICD-10-CM | POA: Diagnosis not present

## 2019-03-25 DIAGNOSIS — C569 Malignant neoplasm of unspecified ovary: Secondary | ICD-10-CM | POA: Diagnosis present

## 2019-03-25 DIAGNOSIS — R590 Localized enlarged lymph nodes: Secondary | ICD-10-CM | POA: Diagnosis not present

## 2019-03-25 DIAGNOSIS — J189 Pneumonia, unspecified organism: Secondary | ICD-10-CM | POA: Diagnosis not present

## 2019-03-25 DIAGNOSIS — I10 Essential (primary) hypertension: Secondary | ICD-10-CM | POA: Diagnosis present

## 2019-03-25 DIAGNOSIS — R079 Chest pain, unspecified: Secondary | ICD-10-CM | POA: Diagnosis not present

## 2019-03-25 DIAGNOSIS — E871 Hypo-osmolality and hyponatremia: Secondary | ICD-10-CM | POA: Diagnosis present

## 2019-03-25 DIAGNOSIS — J90 Pleural effusion, not elsewhere classified: Secondary | ICD-10-CM | POA: Diagnosis not present

## 2019-03-25 HISTORY — DX: Acute and chronic respiratory failure with hypoxia: J96.21

## 2019-03-27 DIAGNOSIS — J449 Chronic obstructive pulmonary disease, unspecified: Secondary | ICD-10-CM | POA: Insufficient documentation

## 2019-03-27 HISTORY — DX: Chronic obstructive pulmonary disease, unspecified: J44.9

## 2019-03-29 DIAGNOSIS — J440 Chronic obstructive pulmonary disease with acute lower respiratory infection: Secondary | ICD-10-CM | POA: Diagnosis not present

## 2019-03-29 DIAGNOSIS — C569 Malignant neoplasm of unspecified ovary: Secondary | ICD-10-CM | POA: Diagnosis not present

## 2019-03-29 DIAGNOSIS — R269 Unspecified abnormalities of gait and mobility: Secondary | ICD-10-CM | POA: Diagnosis not present

## 2019-03-29 DIAGNOSIS — I4891 Unspecified atrial fibrillation: Secondary | ICD-10-CM | POA: Diagnosis not present

## 2019-03-29 DIAGNOSIS — M6281 Muscle weakness (generalized): Secondary | ICD-10-CM | POA: Diagnosis not present

## 2019-03-29 DIAGNOSIS — H409 Unspecified glaucoma: Secondary | ICD-10-CM | POA: Diagnosis not present

## 2019-03-29 DIAGNOSIS — J159 Unspecified bacterial pneumonia: Secondary | ICD-10-CM | POA: Diagnosis not present

## 2019-03-29 DIAGNOSIS — J9621 Acute and chronic respiratory failure with hypoxia: Secondary | ICD-10-CM | POA: Diagnosis not present

## 2019-03-29 DIAGNOSIS — Z87891 Personal history of nicotine dependence: Secondary | ICD-10-CM | POA: Diagnosis not present

## 2019-03-29 DIAGNOSIS — I10 Essential (primary) hypertension: Secondary | ICD-10-CM | POA: Diagnosis not present

## 2019-03-29 DIAGNOSIS — Z9981 Dependence on supplemental oxygen: Secondary | ICD-10-CM | POA: Diagnosis not present

## 2019-03-29 DIAGNOSIS — F419 Anxiety disorder, unspecified: Secondary | ICD-10-CM | POA: Diagnosis not present

## 2019-03-29 DIAGNOSIS — E0590 Thyrotoxicosis, unspecified without thyrotoxic crisis or storm: Secondary | ICD-10-CM | POA: Diagnosis not present

## 2019-04-04 DIAGNOSIS — J440 Chronic obstructive pulmonary disease with acute lower respiratory infection: Secondary | ICD-10-CM | POA: Diagnosis not present

## 2019-04-04 DIAGNOSIS — C569 Malignant neoplasm of unspecified ovary: Secondary | ICD-10-CM | POA: Diagnosis not present

## 2019-04-04 DIAGNOSIS — M6281 Muscle weakness (generalized): Secondary | ICD-10-CM | POA: Diagnosis not present

## 2019-04-04 DIAGNOSIS — J9621 Acute and chronic respiratory failure with hypoxia: Secondary | ICD-10-CM | POA: Diagnosis not present

## 2019-04-04 DIAGNOSIS — J159 Unspecified bacterial pneumonia: Secondary | ICD-10-CM | POA: Diagnosis not present

## 2019-04-04 DIAGNOSIS — I4891 Unspecified atrial fibrillation: Secondary | ICD-10-CM | POA: Diagnosis not present

## 2019-04-05 DIAGNOSIS — C3412 Malignant neoplasm of upper lobe, left bronchus or lung: Secondary | ICD-10-CM | POA: Diagnosis not present

## 2019-04-05 DIAGNOSIS — C569 Malignant neoplasm of unspecified ovary: Secondary | ICD-10-CM | POA: Diagnosis not present

## 2019-04-07 DIAGNOSIS — J159 Unspecified bacterial pneumonia: Secondary | ICD-10-CM | POA: Diagnosis not present

## 2019-04-07 DIAGNOSIS — J9621 Acute and chronic respiratory failure with hypoxia: Secondary | ICD-10-CM | POA: Diagnosis not present

## 2019-04-07 DIAGNOSIS — C569 Malignant neoplasm of unspecified ovary: Secondary | ICD-10-CM | POA: Diagnosis not present

## 2019-04-07 DIAGNOSIS — M6281 Muscle weakness (generalized): Secondary | ICD-10-CM | POA: Diagnosis not present

## 2019-04-07 DIAGNOSIS — I4891 Unspecified atrial fibrillation: Secondary | ICD-10-CM | POA: Diagnosis not present

## 2019-04-07 DIAGNOSIS — J440 Chronic obstructive pulmonary disease with acute lower respiratory infection: Secondary | ICD-10-CM | POA: Diagnosis not present

## 2019-04-10 DIAGNOSIS — J159 Unspecified bacterial pneumonia: Secondary | ICD-10-CM | POA: Diagnosis not present

## 2019-04-10 DIAGNOSIS — I4891 Unspecified atrial fibrillation: Secondary | ICD-10-CM | POA: Diagnosis not present

## 2019-04-10 DIAGNOSIS — C569 Malignant neoplasm of unspecified ovary: Secondary | ICD-10-CM | POA: Diagnosis not present

## 2019-04-10 DIAGNOSIS — M6281 Muscle weakness (generalized): Secondary | ICD-10-CM | POA: Diagnosis not present

## 2019-04-10 DIAGNOSIS — J440 Chronic obstructive pulmonary disease with acute lower respiratory infection: Secondary | ICD-10-CM | POA: Diagnosis not present

## 2019-04-10 DIAGNOSIS — J9621 Acute and chronic respiratory failure with hypoxia: Secondary | ICD-10-CM | POA: Diagnosis not present

## 2019-04-12 DIAGNOSIS — C569 Malignant neoplasm of unspecified ovary: Secondary | ICD-10-CM | POA: Diagnosis not present

## 2019-04-13 DIAGNOSIS — J449 Chronic obstructive pulmonary disease, unspecified: Secondary | ICD-10-CM | POA: Diagnosis not present

## 2019-04-13 DIAGNOSIS — I10 Essential (primary) hypertension: Secondary | ICD-10-CM | POA: Diagnosis not present

## 2019-04-13 DIAGNOSIS — C792 Secondary malignant neoplasm of skin: Secondary | ICD-10-CM | POA: Diagnosis not present

## 2019-04-13 DIAGNOSIS — F339 Major depressive disorder, recurrent, unspecified: Secondary | ICD-10-CM | POA: Diagnosis not present

## 2019-04-14 ENCOUNTER — Telehealth: Payer: Self-pay | Admitting: Oncology

## 2019-04-14 DIAGNOSIS — J9621 Acute and chronic respiratory failure with hypoxia: Secondary | ICD-10-CM | POA: Diagnosis not present

## 2019-04-14 DIAGNOSIS — C569 Malignant neoplasm of unspecified ovary: Secondary | ICD-10-CM | POA: Diagnosis not present

## 2019-04-14 DIAGNOSIS — J440 Chronic obstructive pulmonary disease with acute lower respiratory infection: Secondary | ICD-10-CM | POA: Diagnosis not present

## 2019-04-14 DIAGNOSIS — M6281 Muscle weakness (generalized): Secondary | ICD-10-CM | POA: Diagnosis not present

## 2019-04-14 DIAGNOSIS — I4891 Unspecified atrial fibrillation: Secondary | ICD-10-CM | POA: Diagnosis not present

## 2019-04-14 DIAGNOSIS — J159 Unspecified bacterial pneumonia: Secondary | ICD-10-CM | POA: Diagnosis not present

## 2019-04-14 NOTE — Telephone Encounter (Signed)
Received message from Warren Lacy, RN [phone] Dr. Jaclyn Shaggy office.  Patient will see Dr. Bobby Rumpf on 04/18/19 to discuss cycle 2 of chemotherapy will possible infusion the same day.

## 2019-04-18 DIAGNOSIS — C569 Malignant neoplasm of unspecified ovary: Secondary | ICD-10-CM | POA: Diagnosis not present

## 2019-04-19 DIAGNOSIS — Z5189 Encounter for other specified aftercare: Secondary | ICD-10-CM | POA: Diagnosis not present

## 2019-04-19 DIAGNOSIS — C569 Malignant neoplasm of unspecified ovary: Secondary | ICD-10-CM | POA: Diagnosis not present

## 2019-04-20 DIAGNOSIS — M6281 Muscle weakness (generalized): Secondary | ICD-10-CM | POA: Diagnosis not present

## 2019-04-20 DIAGNOSIS — I4891 Unspecified atrial fibrillation: Secondary | ICD-10-CM | POA: Diagnosis not present

## 2019-04-20 DIAGNOSIS — J440 Chronic obstructive pulmonary disease with acute lower respiratory infection: Secondary | ICD-10-CM | POA: Diagnosis not present

## 2019-04-20 DIAGNOSIS — J159 Unspecified bacterial pneumonia: Secondary | ICD-10-CM | POA: Diagnosis not present

## 2019-04-20 DIAGNOSIS — J9621 Acute and chronic respiratory failure with hypoxia: Secondary | ICD-10-CM | POA: Diagnosis not present

## 2019-04-20 DIAGNOSIS — C569 Malignant neoplasm of unspecified ovary: Secondary | ICD-10-CM | POA: Diagnosis not present

## 2019-04-21 DIAGNOSIS — J9621 Acute and chronic respiratory failure with hypoxia: Secondary | ICD-10-CM | POA: Diagnosis not present

## 2019-04-21 DIAGNOSIS — J440 Chronic obstructive pulmonary disease with acute lower respiratory infection: Secondary | ICD-10-CM | POA: Diagnosis not present

## 2019-04-21 DIAGNOSIS — J159 Unspecified bacterial pneumonia: Secondary | ICD-10-CM | POA: Diagnosis not present

## 2019-04-21 DIAGNOSIS — C569 Malignant neoplasm of unspecified ovary: Secondary | ICD-10-CM | POA: Diagnosis not present

## 2019-04-21 DIAGNOSIS — I4891 Unspecified atrial fibrillation: Secondary | ICD-10-CM | POA: Diagnosis not present

## 2019-04-21 DIAGNOSIS — M6281 Muscle weakness (generalized): Secondary | ICD-10-CM | POA: Diagnosis not present

## 2019-04-23 DIAGNOSIS — J449 Chronic obstructive pulmonary disease, unspecified: Secondary | ICD-10-CM | POA: Diagnosis not present

## 2019-04-23 DIAGNOSIS — F339 Major depressive disorder, recurrent, unspecified: Secondary | ICD-10-CM | POA: Diagnosis not present

## 2019-04-23 DIAGNOSIS — I10 Essential (primary) hypertension: Secondary | ICD-10-CM | POA: Diagnosis not present

## 2019-05-05 ENCOUNTER — Telehealth: Payer: Self-pay | Admitting: Oncology

## 2019-05-05 NOTE — Telephone Encounter (Signed)
Gila for an update on patient's treatment schedule.  Per Lenna Sciara, NP at [facility], patient is due for her third cycle of chemo on 05/09/19 and then she will be scheduled for a CT scan.  She will let me know when this is scheduled so that patient can be scheduled for appointment with Dr. Berline Lopes.

## 2019-05-09 DIAGNOSIS — C569 Malignant neoplasm of unspecified ovary: Secondary | ICD-10-CM

## 2019-05-11 ENCOUNTER — Telehealth: Payer: Self-pay | Admitting: Oncology

## 2019-05-11 DIAGNOSIS — Z5189 Encounter for other specified aftercare: Secondary | ICD-10-CM | POA: Diagnosis not present

## 2019-05-11 DIAGNOSIS — C569 Malignant neoplasm of unspecified ovary: Secondary | ICD-10-CM | POA: Diagnosis not present

## 2019-05-11 NOTE — Telephone Encounter (Signed)
Called Dr. Jaclyn Shaggy office at [place] regarding CT scan.  It will be scheduled for 05/29/19.  Left a message for patient to schedule appointment with Dr. Berline Lopes on 05/30/19.

## 2019-05-12 ENCOUNTER — Telehealth: Payer: Self-pay | Admitting: Oncology

## 2019-05-12 NOTE — Telephone Encounter (Signed)
Talked with Jacqlyn Larsen about making an appointment to see Dr. Berline Lopes.  She is going to have her daughter call back to make the appointment.

## 2019-05-12 NOTE — Telephone Encounter (Signed)
Patient's daughter called back and scheduled appointment to see Dr. Berline Lopes on 05/30/19 to discuss surgery.

## 2019-05-18 ENCOUNTER — Encounter: Payer: Self-pay | Admitting: Oncology

## 2019-05-18 ENCOUNTER — Telehealth: Payer: Self-pay | Admitting: Cardiology

## 2019-05-18 ENCOUNTER — Telehealth: Payer: Self-pay | Admitting: Oncology

## 2019-05-18 DIAGNOSIS — C569 Malignant neoplasm of unspecified ovary: Secondary | ICD-10-CM

## 2019-05-18 NOTE — Progress Notes (Signed)
Sent inbasket to Mila Merry at [facility] regarding referral for possible joint surgery with Dr. Hassell Done or Dr. Rosendo Gros for hernia repair.

## 2019-05-18 NOTE — Telephone Encounter (Signed)
Medical Clearance form faxed to Dr. Marco Collie at [phone].

## 2019-05-18 NOTE — Telephone Encounter (Signed)
Called Dr. Julien Nordmann office regarding cardiac clearance for debulking/hernia repair surgery.

## 2019-05-18 NOTE — Telephone Encounter (Signed)
Dr. Geraldo Pitter, we received a preop clearance request for this patient. I do not see any office visits for her. Did you perhaps see her at [facility]? Does she need an office visit to address clearance?   I tried to reach the patient for further info but no answer. I will try again tomorrow.   Please route response back to P CV DIV PREOP  Thanks, Gae Bon

## 2019-05-18 NOTE — Progress Notes (Signed)
Received call from [place] at [place] with appointment for patient to see Dr. Rosendo Gros on 06/14/19 at 1:30 pm.  Also faxed records to [phone].

## 2019-05-18 NOTE — Telephone Encounter (Signed)
   [facility] Pre-operative Risk Assessment    Request for surgical clearance:  1. What type of surgery is being performed? Debulking surgery and hernia repair  2. When is this surgery scheduled? 06/06/2019  3. What type of clearance is required (medical clearance vs. Pharmacy clearance to hold med vs. Both)? both  4. Are there any medications that need to be held prior to surgery and how long? no  5. Practice name and name of physician performing surgery? [facility], Dr. Jeral Pinch  6. What is your office phone number: [phone]   7.   What is your office fax number: [phone]  8.   Anesthesia type (None, local, MAC, general) ? general   Megan Waller 05/18/2019, 12:46 PM  _________________________________________________________________   (provider comments below)

## 2019-05-19 NOTE — Telephone Encounter (Signed)
   Primary Cardiologist:Rajan R Revankar, MD  Chart reviewed as part of pre-operative protocol coverage. It appears that pt was seen in the hospital by Dr. Geraldo Pitter. She will need an office visit to address clearance.   Pre-op covering staff: - Please schedule appointment and call patient to inform them. - Please contact requesting surgeon's office via preferred method (i.e, phone, fax) to inform them of need for appointment prior to surgery.   Daune Perch, NP  05/19/2019, 3:25 PM

## 2019-05-19 NOTE — Telephone Encounter (Signed)
I do not see that we have ever seen this pt. I am guessing this is going to need to be a New Pt appt? If I have missed an appt somewhere please advise.

## 2019-05-19 NOTE — Telephone Encounter (Signed)
Yes needss appt

## 2019-05-22 NOTE — Telephone Encounter (Signed)
1st attempt to reach pt re: appt for surgical clearance.  Left a detailed message for pt to call the [place] office to schedule.

## 2019-05-22 NOTE — Telephone Encounter (Signed)
Needs appt

## 2019-05-22 NOTE — Telephone Encounter (Signed)
Dr. Geraldo Pitter saw pt in [facility] hospital so not new pt - but does need appt for surgical clearance thanks

## 2019-05-23 ENCOUNTER — Telehealth: Payer: Self-pay | Admitting: Oncology

## 2019-05-23 NOTE — Telephone Encounter (Signed)
Pt have an appointment with Dr Bettina Gavia on April 28th @ 11:00 am

## 2019-05-23 NOTE — Telephone Encounter (Signed)
Jeannine Boga (daughter) with appointment to see Dr. Rosendo Gros at [place] on 06/14/19 at 1:30. Also advised her to call Dr. Julien Nordmann office to set up an appointment to discuss cardiac clearance for surgery. She said she is going to call this morning and had the number.

## 2019-05-24 DIAGNOSIS — J449 Chronic obstructive pulmonary disease, unspecified: Secondary | ICD-10-CM | POA: Diagnosis not present

## 2019-05-24 DIAGNOSIS — I10 Essential (primary) hypertension: Secondary | ICD-10-CM | POA: Diagnosis not present

## 2019-05-24 DIAGNOSIS — F339 Major depressive disorder, recurrent, unspecified: Secondary | ICD-10-CM | POA: Diagnosis not present

## 2019-05-29 ENCOUNTER — Other Ambulatory Visit: Payer: Self-pay | Admitting: Oncology

## 2019-05-29 ENCOUNTER — Ambulatory Visit
Admission: RE | Admit: 2019-05-29 | Discharge: 2019-05-29 | Disposition: A | Discharge status: Home / Self Care | Payer: Self-pay | Source: Ambulatory Visit | Attending: Gynecologic Oncology | Admitting: Gynecologic Oncology

## 2019-05-29 DIAGNOSIS — C569 Malignant neoplasm of unspecified ovary: Secondary | ICD-10-CM | POA: Diagnosis not present

## 2019-05-29 DIAGNOSIS — R1084 Generalized abdominal pain: Secondary | ICD-10-CM | POA: Diagnosis not present

## 2019-05-29 DIAGNOSIS — C561 Malignant neoplasm of right ovary: Secondary | ICD-10-CM | POA: Diagnosis not present

## 2019-05-29 DIAGNOSIS — C562 Malignant neoplasm of left ovary: Secondary | ICD-10-CM | POA: Diagnosis not present

## 2019-05-29 DIAGNOSIS — K435 Parastomal hernia without obstruction or  gangrene: Secondary | ICD-10-CM | POA: Diagnosis not present

## 2019-05-29 DIAGNOSIS — I700 Atherosclerosis of aorta: Secondary | ICD-10-CM | POA: Diagnosis not present

## 2019-05-29 DIAGNOSIS — K8020 Calculus of gallbladder without cholecystitis without obstruction: Secondary | ICD-10-CM | POA: Diagnosis not present

## 2019-05-30 ENCOUNTER — Other Ambulatory Visit: Payer: Self-pay

## 2019-05-30 ENCOUNTER — Telehealth: Payer: Self-pay | Admitting: Oncology

## 2019-05-30 ENCOUNTER — Inpatient Hospital Stay: Payer: Medicare Other | Attending: Gynecologic Oncology | Admitting: Gynecologic Oncology

## 2019-05-30 ENCOUNTER — Encounter: Payer: Self-pay | Admitting: Gynecologic Oncology

## 2019-05-30 VITALS — BP 159/65 | HR 66 | Temp 98.7°F | Resp 17 | Ht 62.0 in | Wt 140.8 lb

## 2019-05-30 DIAGNOSIS — Z800 Family history of malignant neoplasm of digestive organs: Secondary | ICD-10-CM | POA: Diagnosis not present

## 2019-05-30 DIAGNOSIS — Z7982 Long term (current) use of aspirin: Secondary | ICD-10-CM | POA: Diagnosis not present

## 2019-05-30 DIAGNOSIS — C569 Malignant neoplasm of unspecified ovary: Secondary | ICD-10-CM | POA: Diagnosis not present

## 2019-05-30 DIAGNOSIS — I10 Essential (primary) hypertension: Secondary | ICD-10-CM

## 2019-05-30 DIAGNOSIS — Z79899 Other long term (current) drug therapy: Secondary | ICD-10-CM

## 2019-05-30 DIAGNOSIS — E7800 Pure hypercholesterolemia, unspecified: Secondary | ICD-10-CM | POA: Diagnosis not present

## 2019-05-30 DIAGNOSIS — Z8249 Family history of ischemic heart disease and other diseases of the circulatory system: Secondary | ICD-10-CM | POA: Diagnosis not present

## 2019-05-30 DIAGNOSIS — Z8049 Family history of malignant neoplasm of other genital organs: Secondary | ICD-10-CM

## 2019-05-30 DIAGNOSIS — C786 Secondary malignant neoplasm of retroperitoneum and peritoneum: Secondary | ICD-10-CM | POA: Diagnosis not present

## 2019-05-30 DIAGNOSIS — C7989 Secondary malignant neoplasm of other specified sites: Secondary | ICD-10-CM | POA: Diagnosis not present

## 2019-05-30 DIAGNOSIS — K5900 Constipation, unspecified: Secondary | ICD-10-CM | POA: Diagnosis not present

## 2019-05-30 DIAGNOSIS — Z85828 Personal history of other malignant neoplasm of skin: Secondary | ICD-10-CM | POA: Diagnosis not present

## 2019-05-30 DIAGNOSIS — I4891 Unspecified atrial fibrillation: Secondary | ICD-10-CM

## 2019-05-30 DIAGNOSIS — F419 Anxiety disorder, unspecified: Secondary | ICD-10-CM | POA: Diagnosis not present

## 2019-05-30 DIAGNOSIS — C562 Malignant neoplasm of left ovary: Secondary | ICD-10-CM | POA: Diagnosis not present

## 2019-05-30 DIAGNOSIS — G629 Polyneuropathy, unspecified: Secondary | ICD-10-CM

## 2019-05-30 DIAGNOSIS — Z803 Family history of malignant neoplasm of breast: Secondary | ICD-10-CM | POA: Diagnosis not present

## 2019-05-30 DIAGNOSIS — C799 Secondary malignant neoplasm of unspecified site: Secondary | ICD-10-CM

## 2019-05-30 DIAGNOSIS — Z9221 Personal history of antineoplastic chemotherapy: Secondary | ICD-10-CM | POA: Diagnosis not present

## 2019-05-30 NOTE — Progress Notes (Signed)
GYNECOLOGIC ONCOLOGY NEW PATIENT CONSULTATION   Patient Name: Megan Waller  Patient Age: 79 y.o. Date of Service: 05/30/19 Referring Provider: Dr. Bobby Rumpf  Primary Care Provider: Marco Collie, MD Consulting Provider: Jeral Pinch, MD   Assessment/Plan:  79 year old with stage IVB gynecologic malignancy (presumed ovarian) who has had an excellent radiographic and biochemical response after 3 cycles of neoadjuvant chemotherapy.  I reviewed with the patient and her daughter today her excellent response to neoadjuvant chemotherapy, with normalization of her Ca1 25 and complete radiographic response on most recent CT scan.  On exam, I do not palpate her abdominal nodule (initially biopsied for diagnosis) and her pelvic exam is within normal limits.  We reviewed again today the treatment paradigm for ovarian cancer.  Given the burden of disease that she presented with, I had recommended neoadjuvant chemotherapy followed by interval debulking surgery.  I feel that she is an excellent candidate for debulking surgery with several caveats.  She was unaware that she has a large paramedian hernia containing both transverse colon and small bowel loops.  I suspect that this developed subsequent to her surgeries in 2013 or 2014 related to diverticular disease.  She is not symptomatic from the hernia other than feeling a bulge and some bloating on the left side.  However, I think it would be quite difficult to proceed with debulking surgery given the presence of this hernia.  I am recommending that we move forward with a joint surgery with general surgery for hernia reduction and repair and interval debulking.  I recommend total hysterectomy, bilateral salpingo-oophorectomy, omentectomy, and tumor debulking for any visible or palpable lesions.  I would normally attempt this robotically however I think given the presence of her large hernia and prior surgical history, it is more feasible to attempt an open  procedure with the assistance of general surgery for hernia repair.  The patient is scheduled to see one of the general surgeons later this month.  Additionally, given her cardiac history, we will pursue cardiac clearance prior to surgery.  She is scheduled to see her cardiologist on April 28.  Given what I suspect will be a delay of approximately a month to get her scheduled, I will have my office reach out to Dr. Bobby Rumpf to recommend proceeding with a fourth cycle of neoadjuvant chemotherapy with planned surgery approximately 4 weeks after.  The patient and her daughter were amenable to this plan.  We will schedule her for a preoperative visit at the beginning of May after she has seen general surgery and cardiology.  We also discussed the importance of genetic testing and discussed that a referral would be placed to our genetic counselors.  A copy of this note was sent to the patient's referring provider.   45 minutes of total time was spent for this patient encounter, including preparation, face-to-face counseling with the patient and coordination of care, and documentation of the encounter.   Jeral Pinch, MD  Division of Gynecologic Oncology  Department of Obstetrics and Gynecology  [facility]  ___________________________________________  Chief Complaint: Chief Complaint  Patient presents with  . Metastatic adenocarcinoma [facility])    New Patient  . Malignant neoplasm of ovary, unspecified laterality ([place])    History of Present Illness:  Megan Waller is a 79 y.o. y.o. female who is seen in consultation at the request of Dr. Bobby Rumpf for an evaluation of interval debulking surgery in the setting of metastatic ovarian cancer.  Patient started neoadjuvant  chemotherapy with carboplatin and paclitaxel in January.  She was seen for evaluation of cycle 2 on 2/10 which was delayed secondary to hospitalization after cycle 1 for atrial fibrillation and  failure to thrive.  She presented on 2/23 for cycle 2 feeling much better.  Chemotherapy was given with a 25% dose reduction.  Her Ca1 25 was elevated at 180 in mid January and normalized prior to receiving cycle 3.  The patient presents with her daughter today.  Overall she is doing quite well.  She has tolerated chemotherapy significantly better since her first cycle in her hospitalization.  She continues to have some mild nausea related to chemotherapy which is well treated with Zofran.  She has some fatigue that has persisted.  She endorses a good appetite.  She is intermittently constipated and has used MiraLAX several times since starting treatment with good relief.  She denies any urinary symptoms.  She has developed some mild neuropathy in her feet, left more than right.  She denies any vaginal bleeding or discharge.  She had a yeast infection secondary to receiving some antibiotics which has since resolved.  Her medical history is complicated by atrial fibrillation.  She is currently on a baby aspirin and beta-blocker.  She denies having any significant palpitations or irregular heartbeat since her hospitalization several months ago.  She denies any shortness of breath or chest pain with ambulation.  She is able to climb a flight of stairs without difficulty.  The patient lives alone.  Her daughter and brother live in very close proximity.  Her family history is significant for 2 sisters with breast cancer, a niece with breast cancer, a brother with colon cancer, and another brother with metastatic cancer ultimately discovered in his brain that was thought to possibly have originated in his prostate.  Patient is unsure if any of her family members have had genetic testing.  Treatment History: Oncology History Overview Note  Patient presented in December with an abdominal nodule over her left medial abdomen that grew progressively over time.  She was seen by dermatology for this lesion and  underwent a biopsy.  Ca-125: 1/13: 180 2/10: 96.9 3/16: 12.1 4/5: 9.7   Ovarian cancer ([place])  02/14/2019 Initial Biopsy   Left lower abdominal skin biopsy, metastatic adenocarcinoma.  Based on histologic and IHC findings, a gynecologic primary is favored.   02/14/2019 Initial Diagnosis   Ovarian cancer ([place])   03/06/2019 Imaging   CT chest, abdomen, pelvis: Widespread peritoneal carcinomatosis with bilateral adnexal masses, bilateral external iliac adenopathy and a subcutaneous mass in the left periumbilical region, likely a soft tissue metastasis.  No evidence of thoracic metastatic disease.  Enlarging complex cystic and solid left thyroid nodule.  Large recurrent left anterior abdominal wall hernia containing portion of the transverse colon without evidence of incarceration or obstruction.  Cholelithiasis and aortic atherosclerosis.   03/15/2019 -  Chemotherapy   Carboplatin/taxol Cycle 1: 1/20 Cycle 2: 2/23 (delayed secondary to side effects), 25% dose reduction Cycle 3: 3/16    03/17/2019 Imaging   CT chest: No pulmonary embolus noted.  Small pleural effusions with bibasilar atelectasis.  No adenopathy.  Dominant left lobe thyroid mass, stable from prior CT.   05/29/2019 Imaging   CT abdomen and pelvis: Interval reduction in size of bilateral ovarian masses, varies now measuring within normal limits (2 x 1.4 cm on the right and 1.7 x 1.4 cm on the left), essentially complete resolution of previously seen extensive metastatic omental and peritoneal nodularity  about the abdomen and pelvis.  Resolution of bilateral inguinal lymphadenopathy. Parastomal hernia of the left lower quadrant containing nonobstructed loops of transverse colon and mid small bowel.     PAST MEDICAL HISTORY:  Past Medical History:  Diagnosis Date  . Abdominal hernia   . Anxiety   . Diverticulitis   . Glaucoma   . Hypercholesteremia   . Hypertension   . Hyperthyroidism   . Ovarian cancer ([place])   . Skin  cancer    squamous cell     PAST SURGICAL HISTORY:  Past Surgical History:  Procedure Laterality Date  . BREAST LUMPECTOMY    . PARTIAL COLECTOMY    . right hand surgery    . squamous cell skin cancer resections     3    OB/GYN HISTORY:  OB History  Gravida Para Term Preterm AB Living  3 3          SAB TAB Ectopic Multiple Live Births               # Outcome Date GA Lbr Len/2nd Weight Sex Delivery Anes PTL Lv  3 Para           2 Para           1 Para             No LMP recorded.  Age at menarche: 67 Age at menopause: Unsure, denies any postmenopausal bleeding Hx of HRT: Yes, was on Premarin for years Hx of STDs: Denies Last pap: Unsure History of abnormal pap smears: No  MEDICATIONS: Outpatient Encounter Medications as of 05/30/2019  Medication Sig  . aspirin 81 MG EC tablet Take by mouth.  . carvedilol (COREG) 25 MG tablet Take 25 mg by mouth 2 (two) times daily.  Marland Kitchen diltiazem (CARDIZEM CD) 120 MG 24 hr capsule Take 120 mg by mouth daily.  . DULoxetine (CYMBALTA) 20 MG capsule Take 20 mg by mouth 2 (two) times daily.  . furosemide (LASIX) 40 MG tablet Take 40 mg by mouth daily.  . methimazole (TAPAZOLE) 5 MG tablet   . rosuvastatin (CRESTOR) 10 MG tablet Take by mouth.  . Travoprost, BAK Free, (TRAVATAN) 0.004 % SOLN ophthalmic solution    No facility-administered encounter medications on file as of 05/30/2019.    ALLERGIES:  Allergies  Allergen Reactions  . Ezetimibe-Simvastatin Other (See Comments)    Aching Aching      FAMILY HISTORY:  Family History  Problem Relation Age of Onset  . Stroke Mother   . Heart attack Father   . Breast cancer Sister   . Prostate cancer Brother   . Colon cancer Brother   . Breast cancer Sister   . Breast cancer Niece      SOCIAL HISTORY:    Social Connections:   . Frequency of Communication with Friends and Family:   . Frequency of Social Gatherings with Friends and Family:   . Attends Religious Services:   .  Active Member of Clubs or Organizations:   . Attends Archivist Meetings:   Marland Kitchen Marital Status:     REVIEW OF SYSTEMS:  Pertinent positives as per HPI Denies appetite changes, fevers, chills, unexplained weight changes. Denies hearing loss, neck lumps or masses, mouth sores, ringing in ears or voice changes. Denies cough or wheezing.  Denies shortness of breath. Denies chest pain or palpitations. Denies leg swelling. Denies abdominal distention, pain, blood in stools, diarrhea, nausea, vomiting, or early satiety. Denies pain with intercourse,  dysuria, frequency, hematuria or incontinence. Denies hot flashes, pelvic pain, vaginal bleeding or vaginal discharge.   Denies joint pain, back pain or muscle pain/cramps. Denies itching, rash, or wounds. Denies dizziness, headaches, numbness or seizures. Denies swollen lymph nodes or glands, denies easy bruising or bleeding. Denies anxiety, depression, confusion, or decreased concentration.  Physical Exam:  Vital Signs for this encounter:  Blood pressure (!) 159/65, pulse 66, temperature 98.7 F (37.1 C), temperature source Temporal, resp. rate 17, height 5\' 2"  (1.575 m), weight 140 lb 12.8 oz (63.9 kg), SpO2 96 %. Body mass index is 25.75 kg/m. General: Alert, oriented, no acute distress.  HEENT: Normocephalic, atraumatic. Sclera anicteric.  Chest: Mildly decreased breath sounds at lung bases, otherwise clear to auscultation bilaterally. No wheezes, rhonchi, or rales. Cardiovascular: Regular rate and rhythm, no murmurs, rubs, or gallops.  Abdomen: Normoactive bowel sounds. Soft, nondistended, nontender to palpation.  Hernia appreciated in left mid to lower abdomen measuring approximately 8 cm.  Well-healed midline laparotomy scar as well as prior ostomy site.  No masses appreciated.  No palpable fluid wave.  Extremities: Grossly normal range of motion. Warm, well perfused. No edema bilaterally.  Skin: No rashes or lesions.   Lymphatics: No cervical, supraclavicular, or inguinal adenopathy.  GU:  Normal external female genitalia. No lesions. No discharge or bleeding.             Bladder/urethra:  No lesions or masses, well supported bladder             Vagina: Moderately atrophic vaginal mucosa.  No lesions or masses.             Cervix: Normal appearing, no lesions.             Uterus: Small, mobile, no parametrial involvement or nodularity.             Adnexa: No masses appreciated.  Rectal: No nodularity.  LABORATORY AND RADIOLOGIC DATA:  Outside medical records were reviewed to synthesize the above history, along with the history and physical obtained during the visit.   No results found for: WBC, HGB, HCT, PLT, GLUCOSE, CHOL, TRIG, HDL, LDLDIRECT, LDLCALC, ALT, AST, NA, K, CL, CREATININE, BUN, CO2, TSH, PSA, INR, GLUF, HGBA1C, MICROALBUR

## 2019-05-30 NOTE — Patient Instructions (Signed)
We will plan to see you in the office after you have met with the general surgeon and cardiology.  Preparing for your Surgery  Plan for surgery with Dr. Jeral Pinch and a general surgeon at [facility] will be scheduled for a total hysterectomy (removal of uterus or cervix), bilateral salpingo-oophorectomy, omentectomy, tumor debulking.  Approach as robotic vs open incision will be determined based on general surgeon evaluation.   Pre-operative Testing -You will receive a phone call from presurgical testing at [facility] to arrange for a pre-operative appointment over the phone, lab appointment, and COVID test.  The COVID test normally happens three days prior to the surgery and then you are asked to self quarantine up until surgery.   -Bring your insurance card, copy of an advanced directive if applicable, medication list  -At that visit, you will be asked to sign a consent for a possible blood transfusion in case a transfusion becomes necessary during surgery.  The need for a blood transfusion is rare but having consent is a necessary part of your care.     -Do not take supplements such as fish oil (omega 3), red yeast rice, tumeric before your surgery.   Day Before Surgery at [facility]. FOLLOW THE INSTRUCTIONS FROM THE GENERAL SURGEON IF DIFFERENT. You will be asked to take in a light diet the day before surgery.  Avoid carbonated beverages.  You will be advised to have nothing to eat or drink after midnight the evening before.    Eat a light diet the day before surgery.  Examples including soups, broths, toast, yogurt, mashed potatoes.  Things to avoid include carbonated beverages (fizzy beverages), raw fruits and raw vegetables, or beans.   If your bowels are filled with gas, your surgeon will have difficulty visualizing your pelvic organs which increases your surgical risks.  Your role in  recovery Your role is to become active as soon as directed by your doctor, while still giving yourself time to heal.  Rest when you feel tired. You will be asked to do the following in order to speed your recovery:  - Cough and breathe deeply. This helps to clear and expand your lungs and can prevent pneumonia after surgery.  - [place]. Do mild physical activity. Walking or moving your legs help your circulation and body functions return to normal. Do not try to get up or walk alone the first time after surgery.   -If you develop swelling on one leg or the other, pain in the back of your leg, redness/warmth in one of your legs, please call the office or go to the Emergency Room to have a doppler to rule out a blood clot. For shortness of breath, chest pain-seek care in the Emergency Room as soon as possible. - Actively manage your pain. Managing your pain lets you move in comfort. We will ask you to rate your pain on a scale of zero to 10. It is your responsibility to tell your doctor or nurse where and how much you hurt so your pain can be treated.  Special Considerations -If you are diabetic, you may be placed on insulin after surgery to have closer control over your blood sugars to promote healing and recovery.  This does not mean that you will be discharged on insulin.  If applicable, your oral antidiabetics will be resumed when you are tolerating a solid diet.  -  Your final pathology results from surgery should be available around one week after surgery and the results will be relayed to you when available.  -FMLA forms can be faxed to [phone] and please allow 5-7 business days for completion.  Pain Management After Surgery -You WILL BE prescribed your pain medication and bowel regimen medications before surgery so that you can have these available when you are discharged from the hospital. The pain medication is for use ONLY AFTER surgery and a new prescription will  not be given.   -Make sure that you have Tylenol and Ibuprofen at home to use on a regular basis after surgery for pain control. We recommend alternating the medications every hour to six hours since they work differently and are processed in the body differently for pain relief.  Bowel Regimen -You WILL BE prescribed Sennakot-S to take nightly to prevent constipation especially if you are taking the narcotic pain medication intermittently.  It is important to prevent constipation and drink adequate amounts of liquids. You can stop taking this medication when you are not taking pain medication and you are back on your normal bowel routine.   Blood Transfusion Information (For the consent to be signed before surgery)  We will be checking your blood type before surgery so in case of emergencies, we will know what type of blood you would need.                                            WHAT IS A BLOOD TRANSFUSION?  A transfusion is the replacement of blood or some of its parts. Blood is made up of multiple cells which provide different functions.  Red blood cells carry oxygen and are used for blood loss replacement.  White blood cells fight against infection.  Platelets control bleeding.  Plasma helps clot blood.  Other blood products are available for specialized needs, such as hemophilia or other clotting disorders. BEFORE THE TRANSFUSION  Who gives blood for transfusions?   You may be able to donate blood to be used at a later date on yourself (autologous donation).  Relatives can be asked to donate blood. This is generally not any safer than if you have received blood from a stranger. The same precautions are taken to ensure safety when a relative's blood is donated.  Healthy volunteers who are fully evaluated to make sure their blood is safe. This is blood bank blood. Transfusion therapy is the safest it has ever been in the practice of medicine. Before blood is taken from a donor,  a complete history is taken to make sure that person has no history of diseases nor engages in risky social behavior (examples are intravenous drug use or sexual activity with multiple partners). The donor's travel history is screened to minimize risk of transmitting infections, such as malaria. The donated blood is tested for signs of infectious diseases, such as HIV and hepatitis. The blood is then tested to be sure it is compatible with you in order to minimize the chance of a transfusion reaction. If you or a relative donates blood, this is often done in anticipation of surgery and is not appropriate for emergency situations. It takes many days to process the donated blood. RISKS AND COMPLICATIONS Although transfusion therapy is very safe and saves many lives, the main dangers of transfusion include:   Getting an infectious disease.  Developing a transfusion reaction. This is an allergic reaction to something in the blood you were given. Every precaution is taken to prevent this. The decision to have a blood transfusion has been considered carefully by your caregiver before blood is given. Blood is not given unless the benefits outweigh the risks.  AFTER SURGERY INSTRUCTIONS  Return to work: 4-6 weeks if applicable  Activity: 1. Be up and out of the bed during the day.  Take a nap if needed.  You may walk up steps but be careful and use the hand rail.  Stair climbing will tire you more than you think, you may need to stop part way and rest.   2. No lifting or straining for 6 weeks over 10 pounds. No pushing, pulling, straining for 6 weeks.  3. No driving for 1 week minimum, may be 2 based on approach of surgery.  Do not drive if you are taking narcotic pain medicine.   4. You can shower as soon as the next day after surgery. Shower daily.  Use soap and water on your incision and pat dry; don't rub.  No tub baths or submerging your body in water until cleared by your surgeon. If you have the  soap that was given to you by pre-surgical testing that was used before surgery, you do not need to use it afterwards because this can irritate your incisions.   5. No sexual activity and nothing in the vagina for 6-8 weeks.  6. You may experience a small amount of clear drainage from your incisions, which is normal.  If the drainage persists, increases, or changes color please call the office.  7. Do not use creams, lotions, or ointments such as neosporin on your incisions after surgery until advised by your surgeon because they can cause removal of the dermabond glue on your incisions.    8. You may experience vaginal spotting after surgery or around the 6-8 week mark from surgery when the stitches at the top of the vagina begin to dissolve.  The spotting is normal but if you experience heavy bleeding, call our office.  9. Take Tylenol or ibuprofen first for pain and only use narcotic pain medication for severe pain not relieved by the Tylenol or Ibuprofen.  Monitor your Tylenol intake to a max of 4,000 mg.  Diet: 1. Low sodium Heart Healthy Diet is recommended.  2. It is safe to use a laxative, such as Miralax or Colace, if you have difficulty moving your bowels. You can take Sennakot at bedtime every evening to keep bowel movements regular and to prevent constipation.    Wound Care: 1. Keep clean and dry.  Shower daily.  Reasons to call the Doctor:  Fever - Oral temperature greater than 100.4 degrees Fahrenheit  Foul-smelling vaginal discharge  Difficulty urinating  Nausea and vomiting  Increased pain at the site of the incision that is unrelieved with pain medicine.  Difficulty breathing with or without chest pain  New calf pain especially if only on one side  Sudden, continuing increased vaginal bleeding with or without clots.   Contacts: For questions or concerns you should contact:  Dr. Jeral Pinch at [phone]  Joylene John, NP at [phone]  After  Hours: call [phone] and have the GYN Oncologist paged/contacted

## 2019-05-30 NOTE — Telephone Encounter (Signed)
Called and left a message for Dr. Bobby Rumpf to see if patient can be scheduled for cycle 4 of chemotherapy this week.  Patient has an appointment with Dr. Bobby Rumpf this morning.

## 2019-05-31 ENCOUNTER — Other Ambulatory Visit: Payer: Self-pay | Admitting: Gynecologic Oncology

## 2019-05-31 ENCOUNTER — Telehealth: Payer: Self-pay | Admitting: *Deleted

## 2019-05-31 NOTE — Progress Notes (Signed)
Received records from [facility] in [place]  where patient had her surgeries in 2014.  Patient was initially seen in January 2014.  She was taken to the operating room on 03/07/2012 in the setting of acute sigmoid diverticulitis with perforation.  She underwent diagnostic laparoscopy, sigmoid colon resection with colostomy, partial small intestine resection with primary anastomosis.  This was performed by Dr. Melynda Ripple.  The patient had presented with severe left lower quadrant abdominal pain and a CT scan showed perforated sigmoid colon with abscess formation as well as thickened adherent small intestinal loops.  At the time of her surgery, the patient had a small 2 cm abscess in the upper pelvis centered around a portion of the mid sigmoid colon with perforation in the colon noted.  Loop of small intestine was adherent to this area with perforation secondary to the abscess and acute on chronic infection and inflammation.  On 09/16/2012, the patient underwent extensive laparoscopic lysis of adhesion, laparoscopic reversal of colostomy and partial small bowel resection with primary anastomosis.  Findings at the time of the surgery were 3 loops of small intestine strongly adherent to the left lower pelvis with thinning of the antimesenteric surface and a loop of small intestine adherent to the staple line of the rectal stump.  A small parastomal hernia was noted measuring 2 x 2-1/2 cm.  Some omental adhesions as well as small intestine were adherent to the anterior abdominal midline incision.  Pathology revealed small bowel with chronic inflammation, serosal fibrosis and serosal neovascularization.  No neoplasm or malignancy identified.  Benign colonic mucosa and skin.

## 2019-05-31 NOTE — Telephone Encounter (Signed)
Patient's daughter called back and scheduled a genetics appt for 5/3

## 2019-05-31 NOTE — Telephone Encounter (Signed)
Fax a request to  hospital for surgery notes and pathology from 2013-2014

## 2019-06-01 ENCOUNTER — Telehealth: Payer: Self-pay | Admitting: Oncology

## 2019-06-01 ENCOUNTER — Telehealth: Payer: Self-pay | Admitting: Gynecologic Oncology

## 2019-06-01 DIAGNOSIS — Z5111 Encounter for antineoplastic chemotherapy: Secondary | ICD-10-CM | POA: Diagnosis not present

## 2019-06-01 DIAGNOSIS — C569 Malignant neoplasm of unspecified ovary: Secondary | ICD-10-CM | POA: Diagnosis not present

## 2019-06-01 NOTE — Telephone Encounter (Signed)
Amy, RN from Dr. Jaclyn Shaggy office called back.  Discussed the plan for 6 cycles of chemotherapy before surgery.  She said Megan Waller is receiving cycle 4 of chemo today.

## 2019-06-01 NOTE — Telephone Encounter (Signed)
Called and left a message for Dr. Jaclyn Shaggy office with Dr. Charisse March recommendations for 6 cycles of chemotherapy before surgery.  Requested a return call to confirm.

## 2019-06-01 NOTE — Telephone Encounter (Signed)
Called the patient after reading through her operative reports from 2014 which we received yesterday to discuss my recommendation to delay surgery until she has completed 6 cycles of neoadjuvant chemotherapy.  While not standard of care in the setting of neoadjuvant treatment, I am very concerned given her large ventral hernia as well as multiple surgeries with extensive adhesions noted at the time of her ostomy takedown in 2014 that she is at significant risk of increased morbidity during surgery and of complications postop, both of which could significantly delay her restarting chemotherapy.  We will communicate this to her primary medical oncologist.  Jeral Pinch MD Gynecologic Oncology

## 2019-06-02 ENCOUNTER — Telehealth: Payer: Self-pay | Admitting: Gynecologic Oncology

## 2019-06-02 DIAGNOSIS — C569 Malignant neoplasm of unspecified ovary: Secondary | ICD-10-CM | POA: Diagnosis not present

## 2019-06-02 NOTE — Telephone Encounter (Signed)
Called the patient again to discuss plan to delay surgery until after she finishes 6 cycles of neoadjuvant chemotherapy.  No answer.  Left message requesting callback.  Jeral Pinch MD Gynecologic Oncology

## 2019-06-05 ENCOUNTER — Telehealth: Payer: Self-pay | Admitting: *Deleted

## 2019-06-05 NOTE — Telephone Encounter (Signed)
Called and spoke with the patient's daughter, moved appts from 5/3 to 6/1 per Dr Berline Lopes

## 2019-06-08 ENCOUNTER — Telehealth: Payer: Self-pay | Admitting: Cardiology

## 2019-06-08 ENCOUNTER — Other Ambulatory Visit: Payer: Self-pay

## 2019-06-08 ENCOUNTER — Encounter: Payer: Self-pay | Admitting: *Deleted

## 2019-06-08 ENCOUNTER — Ambulatory Visit (INDEPENDENT_AMBULATORY_CARE_PROVIDER_SITE_OTHER): Payer: Medicare Other | Admitting: Cardiology

## 2019-06-08 VITALS — BP 130/70 | HR 66 | Ht 62.0 in | Wt 139.0 lb

## 2019-06-08 DIAGNOSIS — I10 Essential (primary) hypertension: Secondary | ICD-10-CM | POA: Diagnosis not present

## 2019-06-08 DIAGNOSIS — Z01810 Encounter for preprocedural cardiovascular examination: Secondary | ICD-10-CM | POA: Diagnosis not present

## 2019-06-08 DIAGNOSIS — I480 Paroxysmal atrial fibrillation: Secondary | ICD-10-CM

## 2019-06-08 DIAGNOSIS — E782 Mixed hyperlipidemia: Secondary | ICD-10-CM | POA: Diagnosis not present

## 2019-06-08 NOTE — Telephone Encounter (Signed)
STAT if HR is under 50 or over 120 (normal HR is 60-100 beats per minute)  1) What is your heart rate? Around 100 per patient's daughter  2) Do you have a log of your heart rate readings (document readings)?  [phone]  3) Do you have any other symptoms? No

## 2019-06-08 NOTE — Patient Instructions (Signed)
Medication Instructions:  Your physician recommends that you continue on your current medications as directed. Please refer to the Current Medication list given to you today.  *If you need a refill on your cardiac medications before your next appointment, please call your pharmacy*   Lab Work: NONE  If you have labs (blood work) drawn today and your tests are completely normal, you will receive your results only by: Marland Kitchen MyChart Message (if you have MyChart) OR . A paper copy in the mail If you have any lab test that is abnormal or we need to change your treatment, we will call you to review the results.   Testing/Procedures: NONE    Follow-Up: At [facility], you and your health needs are our priority.  As part of our continuing mission to provide you with exceptional heart care, we have created designated Provider Care Teams.  These Care Teams include your primary Cardiologist (physician) and Advanced Practice Providers (APPs -  Physician Assistants and Nurse Practitioners) who all work together to provide you with the care you need, when you need it.  We recommend signing up for the patient portal called "MyChart".  Sign up information is provided on this After Visit Summary.  MyChart is used to connect with patients for Virtual Visits (Telemedicine).  Patients are able to view lab/test results, encounter notes, upcoming appointments, etc.  Non-urgent messages can be sent to your provider as well.   To learn more about what you can do with MyChart, go to [email or web address].    Your next appointment:   3 month(s)  The format for your next appointment:   In Person  Provider:   Berniece Salines, DO   Other Instructions Thank you for choosing [facility]!

## 2019-06-08 NOTE — Progress Notes (Signed)
Cardiology Office Note:    Date:  06/08/2019   ID:  Megan Waller, DOB Apr 21, 1940, MRN [id number]  PCP:  Marco Collie, MD  Cardiologist:  Jenean Lindau, MD  Electrophysiologist:  None   Referring MD: Marco Collie, MD   Chief Complaint  Patient presents with  . Pre-op Exam    Hernia repair and uterine cancer    History of Present Illness:    Megan Waller is a 79 y.o. female with a hx of stage IV ovarian cancer status post chemotherapy then recurrences of cancer now she has  received 1 of 3 cycles of carboplatin/paclitaxel chemotherapy, atrial fibrillation not currently on anticoagulation takes aspirin daily, hypertension, hyperlipidemia, anxiety presents to be evaluated for preoperative cardivascular evaluation.   Of note the patient was hospitalized in [facility] in January 2021 at that time she was noted to be atrial fibrillation.  She did have an echocardiogram was normal with no evidence of wall motion abnormalities.  Her troponin elevation was thought to be in the setting of her SVT.  Unclear why patient was not started on anticoagulation but she is on aspirin and states that she has been taking aspirin with no bleeding.  The patient is undergoing 3 cycles of chemotherapy for reevaluation for debulking surgery, which may include total hysterectomy bilateral salpingooophorectomy, omentectomy and possibly hernia surgery.  Today patient tells me that she has active at home although post chemo she gets fatigued.  Patient reports that she walks twice a day (in the morning and in the evening) about a mile.  She denies any chest pain, shortness of breath, nausea, vomiting.  She lives by herself that she does all of her household chores including cooking her meals and cleaning her house.  She denies any inquiry of any solicited shortness of breath on exertion or chest pain.  She admits to palpitations at times.    Past Medical History:  Diagnosis Date  .  Abdominal hernia   . Acute on chronic respiratory failure with hypoxia ([place]) 03/25/2019  . Anxiety   . Anxiety   . Atrial fibrillation ([place]) 03/24/2019  . Chest pressure 09/18/2015  . Community acquired bilateral lower lobe pneumonia 03/24/2019  . COPD (chronic obstructive pulmonary disease) ([place]) 03/27/2019  . Diverticulitis   . Essential hypertension 09/18/2015  . GAD (generalized anxiety disorder) 12/01/2015  . Glaucoma   . Hypercholesteremia   . Hypertension   . Hyperthyroidism   . Hypokalemia 09/18/2015  . Ovarian cancer ([place])   . Parkinson disease ([place]) 12/01/2015  . Primary insomnia 12/01/2015  . Skin cancer    squamous cell  . Weakness 09/18/2015    Past Surgical History:  Procedure Laterality Date  . BREAST LUMPECTOMY    . PARTIAL COLECTOMY     had partial colectomy for diverticular disease, required an ostomy followed by stoma revision  . right hand surgery    . squamous cell skin cancer resections     3    Current Medications: Current Meds  Medication Sig  . aspirin 81 MG EC tablet Take by mouth.  . carvedilol (COREG) 25 MG tablet Take 25 mg by mouth 2 (two) times daily.  Marland Kitchen diltiazem (CARDIZEM CD) 120 MG 24 hr capsule Take 120 mg by mouth daily.  . DULoxetine (CYMBALTA) 20 MG capsule Take 40 mg by mouth daily.  . furosemide (LASIX) 40 MG tablet Take 40 mg by mouth daily.  . methimazole (TAPAZOLE) 5 MG tablet Take 2.5 mg by  mouth daily.   . rosuvastatin (CRESTOR) 10 MG tablet Take 10 mg by mouth once a week.   . Travoprost, BAK Free, (TRAVATAN) 0.004 % SOLN ophthalmic solution      Allergies:   Ezetimibe-simvastatin   Social History   Socioeconomic History  . Marital status: Married    Spouse name: Not on file  . Number of children: Not on file  . Years of education: Not on file  . Highest education level: Not on file  Occupational History  . Not on file  Tobacco Use  . Smoking status: Former Research scientist (life sciences)  . Smokeless tobacco: Never Used  . Tobacco comment: Quit  29 years ago  Substance and Sexual Activity  . Alcohol use: Never  . Drug use: Never  . Sexual activity: Not on file  Other Topics Concern  . Not on file  Social History Narrative  . Not on file   Social Determinants of Health   Financial Resource Strain:   . Difficulty of Paying Living Expenses:   Food Insecurity:   . Worried About Charity fundraiser in the Last Year:   . Arboriculturist in the Last Year:   Transportation Needs:   . Film/video editor (Medical):   Marland Kitchen Lack of Transportation (Non-Medical):   Physical Activity:   . Days of Exercise per Week:   . Minutes of Exercise per Session:   Stress:   . Feeling of Stress :   Social Connections:   . Frequency of Communication with Friends and Family:   . Frequency of Social Gatherings with Friends and Family:   . Attends Religious Services:   . Active Member of Clubs or Organizations:   . Attends Archivist Meetings:   Marland Kitchen Marital Status:      Family History: The patient's family history includes Breast cancer in her niece, sister, and sister; Colon cancer in her brother; Heart attack in her father; Prostate cancer in her brother; Stroke in her mother.  ROS:   Review of Systems  Constitution: Negative for decreased appetite, fever and weight gain.  HENT: Negative for congestion, ear discharge, hoarse voice and sore throat.   Eyes: Negative for discharge, redness, vision loss in right eye and visual halos.  Cardiovascular: Negative for chest pain, dyspnea on exertion, leg swelling, orthopnea and palpitations.  Respiratory: Negative for cough, hemoptysis, shortness of breath and snoring.   Endocrine: Negative for heat intolerance and polyphagia.  Hematologic/Lymphatic: Negative for bleeding problem. Does not bruise/bleed easily.  Skin: Negative for flushing, nail changes, rash and suspicious lesions.  Musculoskeletal: Negative for arthritis, joint pain, muscle cramps, myalgias, neck pain and stiffness.    Gastrointestinal: Negative for abdominal pain, bowel incontinence, diarrhea and excessive appetite.  Genitourinary: Negative for decreased libido, genital sores and incomplete emptying.  Neurological: Negative for brief paralysis, focal weakness, headaches and loss of balance.  Psychiatric/Behavioral: Negative for altered mental status, depression and suicidal ideas.  Allergic/Immunologic: Negative for HIV exposure and persistent infections.    EKGs/Labs/Other Studies Reviewed:    The following studies were reviewed today:   EKG:  The ekg ordered today demonstrates sinus rhythm, heart rate 80 bpm,  Pharmacologic stress test performed in 2017 via [facility]: No reversible ischemia or infarction.  No left ventricular wall motion.  Left ventricle ejection fraction 82%.  Normal study.   TTE performed at [facility] 02/2019 FINDINGS Procedure:2D images, m-mode, color and spectral Doppler were obtained and reviewed. ECG rhythm:Sinus rhythm. Study quality:This was  a technically adequate study to  a technically difficult study. Left Ventricle:The left ventricular size is normal.   There is mild concentric left ventricular hypertrophy.   There is normal global left ventricular contractility.   Overall left ventricular systolic function is normal with, an EF between 60 - 65 %.   The diastolic filling pattern is normal for the age of the patient.   No regional wall motion abnormalities were noted. Right Ventricle:The right ventricle is normal in size and function.   The RV was not well visualized. Left Atrium:Left atrium is mildly dilated by volume. Right Atrium:The right atrium is normal in size and function.   The right atrium was not well visualized. ASD/VSD:Interatrial and interventricular septum intact. Aortic Valve:The aortic valve is trileaflet, and appears structurally normal. No aortic stenosis or regurgitation. Mitral Valve:Normal appearing mitral valve.    There is trace mitral  regurgitation. Tricuspid Valve:The tricuspid valve appears structurally normal.   Mild tricuspid regurgitation present.   The right ventricular systolic pressure, as measured by Doppler, is 31 mmhg. Pulmonic Valve:The pulmonic valve is normal.   Trace/mild (physiologic)  pulmonic regurgitation. Aorta:The aortic root, ascending aorta and aortic arch appear normal. XNA:TFTDDU inferior vena cava with normal inspiratory collapse. Pulmonary Veins:The flow patterns, measured by Doppler, appear normal. Pericardium:The pericardium is normal.   There is no pericardial effusion.  CONCLUSIONS ----------- 1. There is mild concentric left ventricular hypertrophy. 2. There is normal global left ventricular contractility. 3. Overall left ventricular systolic function is normal with, an EF between 60 - 65 %. 4. The diastolic filling pattern is normal for the age of the patient. 5. No regional wall motion abnormalities were noted. 6. The right ventricle is normal in size and function. 7. Left atrium is mildly dilated by volume. 8. The pericardium is normal.  CT done at [place] 05/29/19 1. Interval reduction in size of bilateral ovarian masses, ovaries now measuring within normal limits, essentially complete resolution of previously seen extensive metastatic omental and peritoneal nodularity about the abdomen and pelvis, and resolution of bilateral inguinal lymphadenopathy. Findings are consistent with treatment response, without CT evidence of residual disease in the abdomen or pelvis. 2.  Cholelithiasis. 3. Parastomal hernia of the left lower quadrant containing nonobstructed loops of transverse colon and mid small bowel. 4.  Aortic Atherosclerosis (ICD10-I70.0).    Recent Labs: No results found for requested labs within last 8760 hours.  Recent Lipid Panel No results found for: CHOL, TRIG, HDL, CHOLHDL, VLDL, LDLCALC, LDLDIRECT  Physical Exam:    VS:  BP 130/70 (BP  Location: Left Arm, Patient Position: Sitting, Cuff Size: Normal)   Pulse 66   Ht 5\' 2"  (1.575 m)   Wt 139 lb (63 kg)   SpO2 96%   BMI 25.42 kg/m     Wt Readings from Last 3 Encounters:  06/08/19 139 lb (63 kg)  05/30/19 140 lb 12.8 oz (63.9 kg)     GEN: Well nourished, well developed in no acute distress HEENT: Normal NECK: No JVD; No carotid bruits LYMPHATICS: No lymphadenopathy CARDIAC: S1S2 noted,RRR, no murmurs, rubs, gallops RESPIRATORY:  Clear to auscultation without rales, wheezing or rhonchi  ABDOMEN: Soft, non-tender, non-distended, +bowel sounds, no guarding. EXTREMITIES: No edema, No cyanosis, no clubbing MUSCULOSKELETAL:  No deformity  SKIN: Warm and dry NEUROLOGIC:  Alert and oriented x 3, non-focal PSYCHIATRIC:  Normal affect, good insight  ASSESSMENT:    1. Paroxysmal atrial fibrillation (HCC)   2. Essential hypertension   3.  Mixed hyperlipidemia   4. Preoperative cardiovascular examination    PLAN:    The patient is in normal sinus rhythm today.  Rate controlled.  She will continue her carvedilol 25 mg daily as well as her Cardizem 120 mg daily.  She is on aspirin.  It appears that her atrial fibrillation may have been an isolated event- however she is on Aspirin 81 mg daily. If there have repeat episode of afib it will be reasonable to start the patient on anticoagulation. She may need to wear an ambulatory monitor to assess the burden if any of atrial fibrillation.   Hypertension - her blood pressure is acceptable in the office today. Continue with current medication regimen.  Hyperlipidemia-continue patient on her current statin medication.  In terms of her preoperative cardiac examination, the patient does not have any unstable cardiac conditions.  Upon evaluation today, she can achieve 4 METs or greater without anginal symptoms.  According to [facility] and AHA guidelines, she requires no further cardiac workup prior to she noncardiac surgery and should be at  acceptable risk.  Given her frailty if the patient does not undergo her surgery within the next 4-6 weeks she may need to be evaluated again prior to surgery. In addition I did speak with the patient and her daughter in together that if the patient experiences and symptoms concerning for angina prior to her procedure to notify my office.     The patient and her daughter are both in agreement with the above plan. The patient left the office in stable condition.  The patient will follow up in 2 months.   Medication Adjustments/Labs and Tests Ordered: Current medicines are reviewed at length with the patient today.  Concerns regarding medicines are outlined above.  Orders Placed This Encounter  Procedures  . EKG 12-Lead   No orders of the defined types were placed in this encounter.   Patient Instructions  Medication Instructions:  Your physician recommends that you continue on your current medications as directed. Please refer to the Current Medication list given to you today.  *If you need a refill on your cardiac medications before your next appointment, please call your pharmacy*   Lab Work: NONE  If you have labs (blood work) drawn today and your tests are completely normal, you will receive your results only by: Marland Kitchen MyChart Message (if you have MyChart) OR . A paper copy in the mail If you have any lab test that is abnormal or we need to change your treatment, we will call you to review the results.   Testing/Procedures: NONE    Follow-Up: At [facility], you and your health needs are our priority.  As part of our continuing mission to provide you with exceptional heart care, we have created designated Provider Care Teams.  These Care Teams include your primary Cardiologist (physician) and Advanced Practice Providers (APPs -  Physician Assistants and Nurse Practitioners) who all work together to provide you with the care you need, when you need it.  We recommend signing up  for the patient portal called "MyChart".  Sign up information is provided on this After Visit Summary.  MyChart is used to connect with patients for Virtual Visits (Telemedicine).  Patients are able to view lab/test results, encounter notes, upcoming appointments, etc.  Non-urgent messages can be sent to your provider as well.   To learn more about what you can do with MyChart, go to [email or web address].    Your next appointment:   3  month(s)  The format for your next appointment:   In Person  Provider:   Berniece Salines, DO   Other Instructions Thank you for choosing [facility]!       Adopting a Healthy Lifestyle.  Know what a healthy weight is for you (roughly BMI <25) and aim to maintain this   Aim for 7+ servings of fruits and vegetables daily   65-80+ fluid ounces of water or unsweet tea for healthy kidneys   Limit to max 1 drink of alcohol per day; avoid smoking/tobacco   Limit animal fats in diet for cholesterol and heart health - choose grass fed whenever available   Avoid highly processed foods, and foods high in saturated/trans fats   Aim for low stress - take time to unwind and care for your mental health   Aim for 150 min of moderate intensity exercise weekly for heart health, and weights twice weekly for bone health   Aim for 7-9 hours of sleep daily   When it comes to diets, agreement about the perfect plan isnt easy to find, even among the experts. Experts at the [employer] developed an idea known as the Healthy Eating Plate. Just imagine a plate divided into logical, healthy portions.   The emphasis is on diet quality:   Load up on vegetables and fruits - one-half of your plate: Aim for color and variety, and remember that potatoes dont count.   Go for whole grains - one-quarter of your plate: Whole wheat, barley, wheat berries, quinoa, oats, brown rice, and foods made with them. If you want pasta, go with whole wheat  pasta.   Protein power - one-quarter of your plate: Fish, chicken, beans, and nuts are all healthy, versatile protein sources. Limit red meat.   The diet, however, does go beyond the plate, offering a few other suggestions.   Use healthy plant oils, such as olive, canola, soy, corn, sunflower and peanut. Check the labels, and avoid partially hydrogenated oil, which have unhealthy trans fats.   If youre thirsty, drink water. Coffee and tea are good in moderation, but skip sugary drinks and limit milk and dairy products to one or two daily servings.   The type of carbohydrate in the diet is more important than the amount. Some sources of carbohydrates, such as vegetables, fruits, whole grains, and beans-are healthier than others.   Finally, stay active  Signed, Berniece Salines, DO  06/08/2019 10:02 PM    [place]

## 2019-06-08 NOTE — Telephone Encounter (Signed)
Returned call to daughter and left a message without identifying Pt as there is no DPR on file.  Advised daughter that Pt has not been evaluated by [facility].  Advised she call Pt's PCP office with concerns.  Advised would forward this note to [place] scheduling to see if her appointment could be moved up.  Currently not scheduled until May.

## 2019-06-14 ENCOUNTER — Ambulatory Visit: Payer: Self-pay | Admitting: General Surgery

## 2019-06-14 DIAGNOSIS — C569 Malignant neoplasm of unspecified ovary: Secondary | ICD-10-CM | POA: Diagnosis not present

## 2019-06-14 DIAGNOSIS — K432 Incisional hernia without obstruction or gangrene: Secondary | ICD-10-CM | POA: Diagnosis not present

## 2019-06-14 NOTE — H&P (Signed)
History of Present Illness [name] MD; 06/14/2019 11:23 AM) The patient is a 79 year old female who presents with an umbilical hernia. Chief Complaint: Umbilical hernia Patient is a 78 year old female, who comes in with a history of hypertension, hyperlipidemia, and an umbilical hernia. Patient with umbilical hernia at the navel. I did review his CT scan which reveals a small fascial defect. Patient also appears to have rectus diastases. Patient states he has pain after work and some discomfort while working. Patient otherwise is fairly active.  Patient's a previous open inguinal hernias repair with mesh in the 90s. He's had never had an abdomina wall l hernia.   Past Surgical History [facility] Teressa Senter, [place]; 06/14/2019 11:04 AM) Appendectomy  Open Inguinal Hernia Surgery  Bilateral. Vasectomy   Diagnostic Studies History [facility] Teressa Senter, CMA; 06/14/2019 11:04 AM) Colonoscopy  1-5 years ago  Allergies Emmaline Kluver Teressa Senter, CMA; 06/14/2019 11:05 AM) Penicillins  Allergies Reconciled   Social History (Chanel Teressa Senter, CMA; 06/14/2019 11:04 AM) Alcohol use  Occasional alcohol use. Caffeine use  Coffee, Tea. No drug use  Tobacco use  Never smoker.  Family History Antonietta Jewel, [place]; 06/14/2019 11:04 AM) Arthritis  Father, Mother, Sister. Diabetes Mellitus  Mother. Hypertension  Mother. Migraine Headache  Father.  Other Problems (Chanel Teressa Senter, CMA; 06/14/2019 11:04 AM) High blood pressure  Migraine Headache     Review of Systems [name] MD; 06/14/2019 11:21 AM) General Present- Appetite Loss. Not Present- Chills, Fatigue, Fever, Night Sweats, Weight Gain and Weight Loss. Skin Not Present- Change in Wart/Mole, Dryness, Hives, Jaundice, New Lesions, Non-Healing Wounds, Rash and Ulcer. HEENT Present- Seasonal Allergies and Sinus Pain. Not Present- Earache, Hearing Loss, Hoarseness, Nose Bleed, Oral Ulcers, Ringing in the Ears, Sore Throat, Visual Disturbances, Wears  glasses/contact lenses and Yellow Eyes. Respiratory Present- Snoring. Not Present- Bloody sputum, Chronic Cough, Difficulty Breathing and Wheezing. Breast Not Present- Breast Mass, Breast Pain, Nipple Discharge and Skin Changes. Cardiovascular Not Present- Chest Pain, Difficulty Breathing Lying Down, Leg Cramps, Palpitations, Rapid Heart Rate, Shortness of Breath and Swelling of Extremities. Gastrointestinal Present- Abdominal Pain, Chronic diarrhea and Excessive gas. Not Present- Bloating, Bloody Stool, Change in Bowel Habits, Constipation, Difficulty Swallowing, Gets full quickly at meals, Hemorrhoids, Indigestion, Nausea, Rectal Pain and Vomiting. Female Genitourinary Not Present- Blood in Urine, Change in Urinary Stream, Frequency, Impotence, Nocturia, Painful Urination, Urgency and Urine Leakage. Musculoskeletal Not Present- Back Pain, Joint Pain, Joint Stiffness, Muscle Pain, Muscle Weakness and Swelling of Extremities. Neurological Not Present- Decreased Memory, Fainting, Headaches, Numbness, Seizures, Tingling, Tremor, Trouble walking and Weakness. Psychiatric Not Present- Anxiety, Bipolar, Change in Sleep Pattern, Depression, Fearful and Frequent crying. Endocrine Not Present- Cold Intolerance, Excessive Hunger, Hair Changes, Heat Intolerance, Hot flashes and New Diabetes. Hematology Not Present- Blood Thinners, Easy Bruising, Excessive bleeding, Gland problems, HIV and Persistent Infections. All other systems negative  Vitals (Chanel Nolan CMA; 06/14/2019 11:06 AM) 06/14/2019 11:06 AM Weight: 255.25 lb Height: 71in Body Surface Area: 2.34 m Body Mass Index: 35.6 kg/m  Temp.: 98.85F  Pulse: 73 (Regular)  BP: 124/76 (Sitting, Left Arm, Standard)       Physical Exam [name] MD; 06/14/2019 11:23 AM) The physical exam findings are as follows: Note:Constitutional: No acute distress, conversant, appears stated age  Eyes: Anicteric sclerae, moist conjunctiva, no lid  lag  Neck: No thyromegaly, trachea midline, no cervical lymphadenopathy  Lungs: Clear to auscultation biilaterally, normal respiratory effot  Cardiovascular: regular rate & rhythm, no murmurs, no peripheal edema, pedal pulses 2+  GI:  Soft, no masses or hepatosplenomegaly, non-tender to palpation  MSK: Normal gait, no clubbing cyanosis, edema  Skin: No rashes, palpation reveals normal skin turgor  Psychiatric: Appropriate judgment and insight, oriented to person, place, and time  Abdomen Inspection Hernias - Umbilical hernia - Reducible(At approximately 5 to 7:00).    Assessment & Plan [name] MD; 03/04/2109 73:56 AM) UMBILICAL HERNIA WITHOUT OBSTRUCTION AND WITHOUT GANGRENE (K42.9) Impression: Patient is a 79 year old female with a history of hypertension, hyperlipidemia, and a primary umbilical hernia repair.  1. The patient will like to proceed to the operating room for laparoscopic umbilical hernia repair with mesh.  2. I discussed with the patient the signs and symptoms of incarceration and strangulation and the need to proceed to the ER should they occur.  3. I discussed with the patient the risks and benefits of the procedure to include but not limited to: Infection, bleeding, damage to surrounding structures, possible need for further surgery, possible nerve pain, and possible recurrence. The patient was understanding and wishes to proceed.

## 2019-06-16 DIAGNOSIS — H401131 Primary open-angle glaucoma, bilateral, mild stage: Secondary | ICD-10-CM | POA: Diagnosis not present

## 2019-06-21 ENCOUNTER — Ambulatory Visit: Payer: Medicare Other | Admitting: Cardiology

## 2019-06-22 DIAGNOSIS — D649 Anemia, unspecified: Secondary | ICD-10-CM | POA: Diagnosis not present

## 2019-06-22 DIAGNOSIS — C569 Malignant neoplasm of unspecified ovary: Secondary | ICD-10-CM | POA: Diagnosis not present

## 2019-06-22 DIAGNOSIS — R978 Other abnormal tumor markers: Secondary | ICD-10-CM | POA: Diagnosis not present

## 2019-06-23 DIAGNOSIS — C569 Malignant neoplasm of unspecified ovary: Secondary | ICD-10-CM | POA: Diagnosis not present

## 2019-06-23 DIAGNOSIS — F339 Major depressive disorder, recurrent, unspecified: Secondary | ICD-10-CM | POA: Diagnosis not present

## 2019-06-23 DIAGNOSIS — Z5189 Encounter for other specified aftercare: Secondary | ICD-10-CM | POA: Diagnosis not present

## 2019-06-23 DIAGNOSIS — J449 Chronic obstructive pulmonary disease, unspecified: Secondary | ICD-10-CM | POA: Diagnosis not present

## 2019-06-23 DIAGNOSIS — I10 Essential (primary) hypertension: Secondary | ICD-10-CM | POA: Diagnosis not present

## 2019-06-26 ENCOUNTER — Other Ambulatory Visit: Payer: Medicare Other

## 2019-06-26 ENCOUNTER — Ambulatory Visit: Payer: Medicare Other | Admitting: Gynecologic Oncology

## 2019-06-26 ENCOUNTER — Encounter: Payer: Medicare Other | Admitting: Genetic Counselor

## 2019-07-10 DIAGNOSIS — E0590 Thyrotoxicosis, unspecified without thyrotoxic crisis or storm: Secondary | ICD-10-CM | POA: Diagnosis not present

## 2019-07-10 DIAGNOSIS — E785 Hyperlipidemia, unspecified: Secondary | ICD-10-CM | POA: Diagnosis not present

## 2019-07-13 DIAGNOSIS — C569 Malignant neoplasm of unspecified ovary: Secondary | ICD-10-CM | POA: Diagnosis not present

## 2019-07-14 DIAGNOSIS — Z5189 Encounter for other specified aftercare: Secondary | ICD-10-CM | POA: Diagnosis not present

## 2019-07-14 DIAGNOSIS — Z6825 Body mass index (BMI) 25.0-25.9, adult: Secondary | ICD-10-CM | POA: Diagnosis not present

## 2019-07-14 DIAGNOSIS — C569 Malignant neoplasm of unspecified ovary: Secondary | ICD-10-CM | POA: Diagnosis not present

## 2019-07-14 DIAGNOSIS — E876 Hypokalemia: Secondary | ICD-10-CM | POA: Diagnosis not present

## 2019-07-14 DIAGNOSIS — E0590 Thyrotoxicosis, unspecified without thyrotoxic crisis or storm: Secondary | ICD-10-CM | POA: Diagnosis not present

## 2019-07-19 ENCOUNTER — Ambulatory Visit: Payer: Medicare Other | Admitting: Cardiology

## 2019-07-19 ENCOUNTER — Telehealth: Payer: Self-pay | Admitting: Oncology

## 2019-07-19 DIAGNOSIS — Z23 Encounter for immunization: Secondary | ICD-10-CM | POA: Diagnosis not present

## 2019-07-19 NOTE — Telephone Encounter (Addendum)
Called Amy RN with [facility].  Megan Waller's last treatment was 07/14/19 and she is scheduled for a CT on 08/09/19.  Asked if the CT can be moved up and Amy is going to try to reschedule it sooner.

## 2019-07-21 DIAGNOSIS — C562 Malignant neoplasm of left ovary: Secondary | ICD-10-CM | POA: Diagnosis not present

## 2019-07-21 DIAGNOSIS — K8020 Calculus of gallbladder without cholecystitis without obstruction: Secondary | ICD-10-CM | POA: Diagnosis not present

## 2019-07-24 DIAGNOSIS — I10 Essential (primary) hypertension: Secondary | ICD-10-CM | POA: Diagnosis not present

## 2019-07-24 DIAGNOSIS — E0590 Thyrotoxicosis, unspecified without thyrotoxic crisis or storm: Secondary | ICD-10-CM | POA: Diagnosis not present

## 2019-07-24 DIAGNOSIS — F339 Major depressive disorder, recurrent, unspecified: Secondary | ICD-10-CM | POA: Diagnosis not present

## 2019-07-24 DIAGNOSIS — E876 Hypokalemia: Secondary | ICD-10-CM | POA: Diagnosis not present

## 2019-07-25 ENCOUNTER — Other Ambulatory Visit: Payer: Self-pay

## 2019-07-25 ENCOUNTER — Telehealth: Payer: Self-pay | Admitting: Gynecologic Oncology

## 2019-07-25 ENCOUNTER — Other Ambulatory Visit: Payer: Self-pay | Admitting: Genetic Counselor

## 2019-07-25 ENCOUNTER — Inpatient Hospital Stay: Payer: Medicare Other

## 2019-07-25 ENCOUNTER — Inpatient Hospital Stay: Payer: Medicare Other | Attending: Gynecologic Oncology | Admitting: Genetic Counselor

## 2019-07-25 ENCOUNTER — Inpatient Hospital Stay (HOSPITAL_BASED_OUTPATIENT_CLINIC_OR_DEPARTMENT_OTHER): Payer: Medicare Other | Admitting: Gynecologic Oncology

## 2019-07-25 ENCOUNTER — Telehealth: Payer: Self-pay | Admitting: *Deleted

## 2019-07-25 VITALS — BP 158/74 | HR 68 | Resp 16 | Ht 62.0 in | Wt 140.0 lb

## 2019-07-25 DIAGNOSIS — C799 Secondary malignant neoplasm of unspecified site: Secondary | ICD-10-CM

## 2019-07-25 DIAGNOSIS — C569 Malignant neoplasm of unspecified ovary: Secondary | ICD-10-CM

## 2019-07-25 DIAGNOSIS — Z800 Family history of malignant neoplasm of digestive organs: Secondary | ICD-10-CM

## 2019-07-25 DIAGNOSIS — Z808 Family history of malignant neoplasm of other organs or systems: Secondary | ICD-10-CM

## 2019-07-25 DIAGNOSIS — Z8042 Family history of malignant neoplasm of prostate: Secondary | ICD-10-CM

## 2019-07-25 DIAGNOSIS — Z803 Family history of malignant neoplasm of breast: Secondary | ICD-10-CM

## 2019-07-25 MED ORDER — IBUPROFEN 600 MG PO TABS: 600.0000 mg | ORAL_TABLET | Freq: Three times a day (TID) | ORAL | 0 refills | Status: DC | PRN

## 2019-07-25 MED ORDER — OXYCODONE HCL 5 MG PO TABS: 5.0000 mg | ORAL_TABLET | ORAL | 0 refills | Status: DC | PRN

## 2019-07-25 MED ORDER — SENNOSIDES-DOCUSATE SODIUM 8.6-50 MG PO TABS: 2.0000 | ORAL_TABLET | Freq: Every day | ORAL | 0 refills | Status: DC

## 2019-07-25 NOTE — Telephone Encounter (Signed)
Fax request for new cardiology clearance to Dr Harriet Masson office

## 2019-07-25 NOTE — Patient Instructions (Addendum)
Preparing for your Surgery  Plan for surgery on August 15, 2019 with Dr. Jeral Pinch at [place] will be scheduled for a total hysterectomy (removal of uterus or cervix) either robotically or open, bilateral salpingo-oophorectomy, omentectomy, tumor debulking, hernia repair with Dr. Rosendo Gros.  Approach as robotic vs open incision will be determined based on general surgeon evaluation at the time of surgery.   Pre-operative Testing -You will receive a phone call from presurgical testing at [facility] to arrange for a pre-operative appointment over the phone, lab appointment, and COVID test. The COVID test normally happens 3 days prior to the surgery and they ask that you self quarantine after the test up until surgery to decrease chance of exposure.  -Bring your insurance card, copy of an advanced directive if applicable, medication list  -At that visit, you will be asked to sign a consent for a possible blood transfusion in case a transfusion becomes necessary during surgery.  The need for a blood transfusion is rare but having consent is a necessary part of your care.     -You can continue taking your aspirin 81 mg.  -Do not take supplements such as fish oil (omega 3), red yeast rice, turmeric before your surgery.   Day Before Surgery at [place] IF DIFFERENT FROM BELOW.  -You will be asked to take in a light diet the day before surgery. You will be advised you can have clear liquids after midnight and up until 3 hours before your surgery.    Eat a light diet the day before surgery.  Examples including soups, broths, toast, yogurt, mashed potatoes.  AVOID GAS PRODUCING FOODS. Things to avoid include carbonated beverages (fizzy beverages), raw fruits and raw vegetables, or beans.   If your bowels are filled with gas, your surgeon will have difficulty visualizing your pelvic organs which increases your surgical risks.  Your role  in recovery Your role is to become active as soon as directed by your doctor, while still giving yourself time to heal.  Rest when you feel tired. You will be asked to do the following in order to speed your recovery:  - Cough and breathe deeply. This helps to clear and expand your lungs and can prevent pneumonia after surgery.  - [place]. Do mild physical activity. Walking or moving your legs help your circulation and body functions return to normal. Do not try to get up or walk alone the first time after surgery.   -If you develop swelling on one leg or the other, pain in the back of your leg, redness/warmth in one of your legs, please call the office or go to the Emergency Room to have a doppler to rule out a blood clot. For shortness of breath, chest pain-seek care in the Emergency Room as soon as possible. - Actively manage your pain. Managing your pain lets you move in comfort. We will ask you to rate your pain on a scale of zero to 10. It is your responsibility to tell your doctor or nurse where and how much you hurt so your pain can be treated.  Special Considerations -If you are diabetic, you may be placed on insulin after surgery to have closer control over your blood sugars to promote healing and recovery.  This does not mean that you will be discharged on insulin.  If applicable, your oral antidiabetics will be resumed when you are tolerating  a solid diet.  -Your final pathology results from surgery should be available around one week after surgery and the results will be relayed to you when available.  -Dr. Lahoma Crocker is the surgeon that assists your GYN Oncologist with surgery.  If you end up staying the night, the next day after your surgery you will either see Dr. Denman George, Dr. Berline Lopes, or Dr. Lahoma Crocker.  -FMLA forms can be faxed to [phone] and please allow 5-7 business days for completion.  Pain Management After Surgery -You have been  prescribed your pain medication and bowel regimen medications before surgery so that you can have these available when you are discharged from the hospital. The pain medication is for use ONLY AFTER surgery and a new prescription will not be given.   -Make sure that you have Tylenol and Ibuprofen at home to use on a regular basis after surgery for pain control. We recommend alternating the medications every hour to six hours since they work differently and are processed in the body differently for pain relief.  -Review the attached handout on narcotic use and their risks and side effects.   Bowel Regimen -You have been prescribed Sennakot-S to take nightly to prevent constipation especially if you are taking the narcotic pain medication intermittently.  It is important to prevent constipation and drink adequate amounts of liquids. You can stop taking this medication when you are not taking pain medication and you are back on your normal bowel routine.  Risks of Surgery Risks of surgery are low but include bleeding, infection, damage to surrounding structures, re-operation, blood clots, and very rarely death.   Blood Transfusion Information (For the consent to be signed before surgery)  We will be checking your blood type before surgery so in case of emergencies, we will know what type of blood you would need.                                            WHAT IS A BLOOD TRANSFUSION?  A transfusion is the replacement of blood or some of its parts. Blood is made up of multiple cells which provide different functions.  Red blood cells carry oxygen and are used for blood loss replacement.  White blood cells fight against infection.  Platelets control bleeding.  Plasma helps clot blood.  Other blood products are available for specialized needs, such as hemophilia or other clotting disorders. BEFORE THE TRANSFUSION  Who gives blood for transfusions?   You may be able to donate blood to be  used at a later date on yourself (autologous donation).  Relatives can be asked to donate blood. This is generally not any safer than if you have received blood from a stranger. The same precautions are taken to ensure safety when a relative's blood is donated.  Healthy volunteers who are fully evaluated to make sure their blood is safe. This is blood bank blood. Transfusion therapy is the safest it has ever been in the practice of medicine. Before blood is taken from a donor, a complete history is taken to make sure that person has no history of diseases nor engages in risky social behavior (examples are intravenous drug use or sexual activity with multiple partners). The donor's travel history is screened to minimize risk of transmitting infections, such as malaria. The donated blood is tested for signs of infectious diseases, such as  HIV and hepatitis. The blood is then tested to be sure it is compatible with you in order to minimize the chance of a transfusion reaction. If you or a relative donates blood, this is often done in anticipation of surgery and is not appropriate for emergency situations. It takes many days to process the donated blood. RISKS AND COMPLICATIONS Although transfusion therapy is very safe and saves many lives, the main dangers of transfusion include:   Getting an infectious disease.  Developing a transfusion reaction. This is an allergic reaction to something in the blood you were given. Every precaution is taken to prevent this. The decision to have a blood transfusion has been considered carefully by your caregiver before blood is given. Blood is not given unless the benefits outweigh the risks.  AFTER SURGERY INSTRUCTIONS  Return to work: 4-6 weeks if applicable  Activity: 1. Be up and out of the bed during the day.  Take a nap if needed.  You may walk up steps but be careful and use the hand rail.  Stair climbing will tire you more than you think, you may need to  stop part way and rest.   2. No lifting or straining for 6 weeks over 10 pounds. No pushing, pulling, straining for 6 weeks.  3. No driving for 1 week(s) if you were cleared to drive before surgery (if open incision, no driving for around 2 weeks).  Do not drive if you are taking narcotic pain medicine and make sure that your reaction time has returned.   4. You can shower as soon as the next day after surgery. Shower daily.  Use soap and water on your incision and pat dry; don't rub.  No tub baths or submerging your body in water until cleared by your surgeon. If you have the soap that was given to you by pre-surgical testing that was used before surgery, you do not need to use it afterwards because this can irritate your incisions.   5. No sexual activity and nothing in the vagina for 8 weeks.  6. You may experience a small amount of clear drainage from your incisions, which is normal.  If the drainage persists, increases, or changes color please call the office.  7. Do not use creams, lotions, or ointments such as neosporin on your incisions after surgery until advised by your surgeon because they can cause removal of the dermabond glue on your incisions.    8. You may experience vaginal spotting after surgery or around the 6-8 week mark from surgery when the stitches at the top of the vagina begin to dissolve.  The spotting is normal but if you experience heavy bleeding, call our office.  9. Take Tylenol or ibuprofen first for pain and only use narcotic pain medication for severe pain not relieved by the Tylenol or Ibuprofen.  Monitor your Tylenol intake to a max of 4,000 mg in a 24 hour period. You can alternate these medications after surgery.  Diet: 1. Low sodium Heart Healthy Diet is recommended.  2. It is safe to use a laxative, such as Miralax or Colace, if you have difficulty moving your bowels. You have been prescribed Sennakot at bedtime every evening to keep bowel movements regular  and to prevent constipation.    Wound Care: 1. Keep clean and dry.  Shower daily.  Reasons to call the Doctor:  Fever - Oral temperature greater than 100.4 degrees Fahrenheit  Foul-smelling vaginal discharge  Difficulty urinating  Nausea and  vomiting  Increased pain at the site of the incision that is unrelieved with pain medicine.  Difficulty breathing with or without chest pain  New calf pain especially if only on one side  Sudden, continuing increased vaginal bleeding with or without clots.   Contacts: For questions or concerns you should contact:  Dr. Jeral Pinch at [phone]  Joylene John, NP at [phone]  After Hours: call [phone] and have the GYN Oncologist paged/contacted

## 2019-07-25 NOTE — Progress Notes (Signed)
Patient here with her daughter for a pre-operative appointment prior to her scheduled surgery on August 15, 2019. She is scheduled for a total hysterectomy (removal of uterus or cervix) either robotically or open, bilateral salpingo-oophorectomy, omentectomy, tumor debulking, hernia repair with Dr. Rosendo Gros.  Approach as robotic vs open incision will be determined based on general surgeon evaluation at the time of surgery.  The surgery was discussed in detail.  See after visit summary for additional details. Visual aids used to discuss items related to surgery including the incentive spirometer, sequential compression stockings, foley catheter, IV pump, multi-modal pain regimen including tylenol, photo of the surgical robot, female reproductive system to discuss surgery in detail.      Discussed post-op pain management in detail including the aspects of the enhanced recovery pathway.  Advised her that a new prescription would be sent in for oxycodone and it is only to be used for after her upcoming surgery.  We discussed the use of tylenol post-op and to monitor for a maximum of 4,000 mg in a 24 hour period.  Also prescribed sennakot to be used after surgery and to hold if having loose stools but patient states she takes Miralax daily.  Discussed bowel regimen in detail.     Discussed the use of lovenox pre-op, SCDs, and measures to take at home to prevent DVT including frequent mobility.  Reportable signs and symptoms of DVT discussed. Post-operative instructions discussed and expectations for after surgery. Incisional care discussed as well including reportable signs and symptoms including erythema, drainage, wound separation.  Discussed the use of lovenox post-operatively for 2 weeks if done laparoscopically or 4 weeks if open. Instruction manual on lovenox administration reviewed and given to the patient.     15 minutes spent with the patient.  Verbalizing understanding of material discussed. No needs or  concerns voiced at the end of the visit. Advised patient and family to call for any needs.  Advised that her post-operative medications had been prescribed and could be picked up at any time.

## 2019-07-25 NOTE — Telephone Encounter (Signed)
Received CT report from [facility] from 5/27.  CT of the abdomen and pelvis shows stable 11 mm well-circumscribed left adrenal lesion in the body of the left adrenal.  Small cyst in the lower pole of the right kidney.  Signs of prior bowel resection of the small bowel and colon with anastomotic sites.  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  No adenopathy in the retroperitoneum or upper abdomen.  Right active adnexa measures 2 x 1.4 cm compared to 1.9 x 1.4 cm on last imaging.  Left adnexa measures 1.6 x 1.6 cm.  Uterus grossly normal on CT.  No pelvic adenopathy.  Ventral suprapubic hernia containing small bowel.  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  Jeral Pinch MD Gynecologic Oncology

## 2019-07-26 ENCOUNTER — Encounter: Payer: Self-pay | Admitting: Genetic Counselor

## 2019-07-26 DIAGNOSIS — Z808 Family history of malignant neoplasm of other organs or systems: Secondary | ICD-10-CM | POA: Insufficient documentation

## 2019-07-26 DIAGNOSIS — Z803 Family history of malignant neoplasm of breast: Secondary | ICD-10-CM | POA: Insufficient documentation

## 2019-07-26 DIAGNOSIS — Z800 Family history of malignant neoplasm of digestive organs: Secondary | ICD-10-CM | POA: Insufficient documentation

## 2019-07-26 DIAGNOSIS — Z8042 Family history of malignant neoplasm of prostate: Secondary | ICD-10-CM | POA: Insufficient documentation

## 2019-07-26 NOTE — Progress Notes (Signed)
REFERRING PROVIDER: Lafonda Mosses, MD [place],  [place] [address]  PRIMARY PROVIDER:  Marco Collie, MD  PRIMARY REASON FOR VISIT:  1. Malignant neoplasm of ovary, unspecified laterality ([place])   2. Family history of breast cancer   3. Family history of colon cancer   4. Family history of prostate cancer   5. Family history of melanoma      HISTORY OF PRESENT ILLNESS:   Megan Waller, a 79 y.o. female, was seen for a [place] cancer genetics consultation at the request of Dr. Berline Lopes due to a personal and family history of cancer.  Megan Waller presents to clinic today to discuss the possibility of a hereditary predisposition to cancer, genetic testing, and to further clarify her future cancer risks, as well as potential cancer risks for family members.    In December 2020, at the age of 11, Megan Waller was diagnosed with metastatic ovarian cancer. The treatment plan includes chemotherapy and debulking surgery (scheduled for 08/15/2019).   Megan Waller also has a history of three or four squamous cell carcinomas removed from her forearms/hands approximately one year ago.   CANCER HISTORY:  Oncology History Overview Note  Patient presented in December with an abdominal nodule over her left medial abdomen that grew progressively over time.  She was seen by dermatology for this lesion and underwent a biopsy.  Ca-125: 1/13: 180 2/10: 96.9 3/16: 12.1 4/5: 9.7   Ovarian cancer ([place])  02/14/2019 Initial Biopsy   Left lower abdominal skin biopsy, metastatic adenocarcinoma.  Based on histologic and IHC findings, a gynecologic primary is favored.   02/14/2019 Initial Diagnosis   Ovarian cancer ([place])   03/06/2019 Imaging   CT chest, abdomen, pelvis: Widespread peritoneal carcinomatosis with bilateral adnexal masses, bilateral external iliac adenopathy and a subcutaneous mass in the left periumbilical region, likely a soft tissue metastasis.  No evidence of thoracic  metastatic disease.  Enlarging complex cystic and solid left thyroid nodule.  Large recurrent left anterior abdominal wall hernia containing portion of the transverse colon without evidence of incarceration or obstruction.  Cholelithiasis and aortic atherosclerosis.   03/15/2019 -  Chemotherapy   Carboplatin/taxol Cycle 1: 1/20 Cycle 2: 2/23 (delayed secondary to side effects), 25% dose reduction Cycle 3: 3/16  Cycle 5: 4/29; continued Neulasta to prevent severe neutropenia from delaying cycles of tx.     03/17/2019 Imaging   CT chest: No pulmonary embolus noted.  Small pleural effusions with bibasilar atelectasis.  No adenopathy.  Dominant left lobe thyroid mass, stable from prior CT.   05/29/2019 Imaging   CT abdomen and pelvis: Interval reduction in size of bilateral ovarian masses, varies now measuring within normal limits (2 x 1.4 cm on the right and 1.7 x 1.4 cm on the left), essentially complete resolution of previously seen extensive metastatic omental and peritoneal nodularity about the abdomen and pelvis.  Resolution of bilateral inguinal lymphadenopathy. Parastomal hernia of the left lower quadrant containing nonobstructed loops of transverse colon and mid small bowel.      RISK FACTORS:  Menarche was at age 82.  First live birth at age 60.  OCP use for approximately 1-2 months.  Ovaries intact: yes.  Hysterectomy: no.  Menopausal status: postmenopausal.  HRT use: Premarin for approximately 10 years. Colonoscopy: yes; 08/17/2011 - no polyps. Number of breast biopsies: 1 - benign cyst removed while pregnant. Any excessive radiation exposure in the past: no   Past Medical History:  Diagnosis Date  . Abdominal  hernia   . Acute on chronic respiratory failure with hypoxia ([place]) 03/25/2019  . Anxiety   . Anxiety   . Atrial fibrillation ([place]) 03/24/2019  . Chest pressure 09/18/2015  . Community acquired bilateral lower lobe pneumonia 03/24/2019  . COPD (chronic obstructive  pulmonary disease) ([place]) 03/27/2019  . Diverticulitis   . Essential hypertension 09/18/2015  . Family history of breast cancer   . Family history of colon cancer   . Family history of melanoma   . Family history of prostate cancer   . GAD (generalized anxiety disorder) 12/01/2015  . Glaucoma   . Hypercholesteremia   . Hypertension   . Hyperthyroidism   . Hypokalemia 09/18/2015  . Ovarian cancer ([place])   . Parkinson disease ([place]) 12/01/2015  . Primary insomnia 12/01/2015  . Skin cancer    squamous cell  . Weakness 09/18/2015    Past Surgical History:  Procedure Laterality Date  . BREAST LUMPECTOMY    . PARTIAL COLECTOMY     had partial colectomy for diverticular disease, required an ostomy followed by stoma revision  . right hand surgery    . squamous cell skin cancer resections     3    Social History   Socioeconomic History  . Marital status: Married    Spouse name: Not on file  . Number of children: Not on file  . Years of education: Not on file  . Highest education level: Not on file  Occupational History  . Not on file  Tobacco Use  . Smoking status: Former Research scientist (life sciences)  . Smokeless tobacco: Never Used  . Tobacco comment: Quit 29 years ago  Substance and Sexual Activity  . Alcohol use: Never  . Drug use: Never  . Sexual activity: Not on file  Other Topics Concern  . Not on file  Social History Narrative  . Not on file   Social Determinants of Health   Financial Resource Strain:   . Difficulty of Paying Living Expenses:   Food Insecurity:   . Worried About Charity fundraiser in the Last Year:   . Arboriculturist in the Last Year:   Transportation Needs:   . Film/video editor (Medical):   Marland Kitchen Lack of Transportation (Non-Medical):   Physical Activity:   . Days of Exercise per Week:   . Minutes of Exercise per Session:   Stress:   . Feeling of Stress :   Social Connections:   . Frequency of Communication with Friends and Family:   . Frequency of Social  Gatherings with Friends and Family:   . Attends Religious Services:   . Active Member of Clubs or Organizations:   . Attends Archivist Meetings:   Marland Kitchen Marital Status:      FAMILY HISTORY:  We obtained a detailed, 4-generation family history.  Significant diagnoses are listed below: Family History  Problem Relation Age of Onset  . Stroke Mother   . Heart attack Father   . Skin cancer Father        non-melanoma; dx. in his late 66s  . Breast cancer Sister 53  . Prostate cancer Brother 41       metastatic  . Colon cancer Brother 67  . Melanoma Brother   . Breast cancer Sister 13  . Breast cancer Niece 9  . Colon cancer Paternal Aunt        dx. in her 73s or 39s  . Cancer Paternal Uncle  mole on back?, dx. in his 16s  . Breast cancer Other 40       paternal great-aunt (grandmother's sister)  . Melanoma Brother 22  . Colon cancer Paternal Aunt        dx. older than 38  . Throat cancer Paternal Aunt        dx. in her 66s  . Breast cancer Cousin        dx. in her 57s or 40s (maternal first cousin)  . Breast cancer Cousin        dx. in her 41s or 8s (maternal first cousin)  . Breast cancer Cousin        dx. in her 74s (maternal first cousin)   Ms. Schuitema has two sons (ages 27 and 3) and one daughter (age 70). None of her children have had cancer. She has six brothers and five sisters. Two of her sisters had breast cancer diagnosed at the age of 77. One brother was diagnosed with colon cancer at the age of 85 and has also had melanoma. Another brother died from prostate cancer that had metastasized to the brain, and a third brother was diagnosed with melanoma at the age of 42. Ms. Files also has a niece who was diagnosed with breast cancer at the age of 21.   Ms. Junio mother died at the age of 38 and did not have cancer. She had three maternal uncles and four maternal aunts, none of whom had cancer. She has three maternal first cousins who have had breast  cancer - two diagnosed in their 83s or 63s, and one diagnosed in her 18s. Her maternal grandmother died in her late 4s and did not have cancer. Her maternal grandfather died in his 69s and did not have cancer.  Ms. Yerby father died at the age of 32 and had a history of non-melanoma skin cancer diagnosed in his late 13s or early 87s. She notes that her father spent a lot of time in the sun gardening. She had two paternal uncles and four paternal aunts. Two aunts had colon cancer - one diagnosed in her 77s or 4s, and the other diagnosed older than 51. Another aunt had throat cancer diagnosed in her 43s. One uncle had cancer that started in a mole on his back in his 20s. Her paternal grandmother died when she was older than 48 and did not have cancer, and her paternal grandfather died in his 19s from diptheria. She has a paternal great-aunt (grandmother's sister) who had breast cancer diagnosed at the age of 9.   Ms. Klug is unaware of previous family history of genetic testing for hereditary cancer risks. She does not know her ancestry. There is no reported Ashkenazi Jewish ancestry. There is no known consanguinity.  GENETIC COUNSELING ASSESSMENT: Megan Waller is a 79 y.o. female with a personal history of ovarian cancer and a family history of breast cancer, colon cancer, metastatic prostate cancer, and melanoma, which is somewhat suggestive of a hereditary cancer syndrome and predisposition to cancer. We, therefore, discussed and recommended the following at today's visit.   DISCUSSION: We discussed that approximately 15-20% of ovarian cancer is hereditary, with most cases associated with the BRCA genes. There are other genes that can be associated with hereditary ovarian cancer syndromes. These include the Lynch syndrome genes, BRIP1, RAD51C, and RAD51D. We discussed that testing is beneficial for several reasons, including knowing about other cancer risks, identifying whether targeted treatment  options such as PARP inhibitors  would be beneficial, identifying potential screening and risk-reduction options that may be appropriate, and to understand if other family members could be at risk for cancer and allow them to undergo genetic testing.   We reviewed the characteristics, features and inheritance patterns of hereditary cancer syndromes. We also discussed genetic testing, including the appropriate family members to test and the process of testing. We discussed the implications of a negative vs a positive result. We also reviewed tumor testing for homologous recombination deficiency (HRD). We discussed that genetic testing on her blood will test for hereditary genetic changes that could explain her diagnosis of cancer. HRD testing is genetic testing performed on the tumor that can determine somatic genetic changes that could influence her management. HRD testing is performed in tandem with genetic testing, and typically at no additional cost.   We recommend Ms. Fabiano pursue genetic testing for the [facility] TumorNext-HRD + CancerNext gene panel. The TumorNext-HRD+CancerNext test offered by [facility] includes paired germline and tumor analyses of 11 genes associated with homologous recombination repair (ATM, BARD1, BRIP1, CHEK2, MRE11A, NBN, PALB2, RAD51C, RAD51D, BRCA1, BRCA2) plus germline analyses of 26 additional genes associated with hereditary cancer (APC, AXIN2, BMPR1A, CDH1, CDK4, CDKN2A, DICER1, HOXB13, EPCAM, GREM1, MLH1, MSH2, MSH3, MSH6, MUTYH, NF1, NTHL1, PMS2, POLD1, POLE, PTEN, RECQL, SMAD4, SMARCA4, STK11, and TP53).   Based on Ms. Hardiman's personal and family history of cancer, she meets medical criteria for genetic testing. Despite that she meets criteria, she may still have an out of pocket cost.  PLAN:  Despite our recommendation, Ms. Fielder did not wish to pursue genetic testing at today's visit. We understand this decision and remain available to coordinate genetic testing at any time  in the future. We, therefore, recommend Ms. Lavis continue to follow the cancer screening guidelines given by her primary healthcare provider.  Based on Ms. Daher's family history, we recommended her sister who was diagnosed with breast cancer at age 32, as well as her brother who was diagnosed with colon cancer at age 3, have genetic counseling and testing. Ms. Lasalle will let us know if we can be of any assistance in coordinating genetic counseling and/or testing for these family members.   Lastly, we encouraged Ms. Jorstad to remain in contact with cancer genetics so that we can continuously update the family history and inform her of any changes in cancer genetics and testing that may be of benefit for this family.   Ms. Brase questions were answered to her satisfaction today. Our contact information was provided should additional questions or concerns arise. Thank you for the referral and allowing [place] to share in the care of your patient.   Clint Guy, [place], [facility] Licensed, Certified Dispensing [email or web address] Phone: [phone]  The patient was seen for a total of 40 minutes in face-to-face genetic counseling.  This patient was discussed with Drs. Magrinat, Lindi Adie and/or Burr Medico who agrees with the above.    _______________________________________________________________________ For Office Staff:  Number of people involved in session: 2 (accompanied by daughter) Was an Intern/ student involved with case: no

## 2019-07-28 ENCOUNTER — Ambulatory Visit: Payer: Medicare Other | Admitting: Gynecologic Oncology

## 2019-07-31 NOTE — Progress Notes (Addendum)
PCP - Marco Collie, MD  Cardiologist - Jyl Heinz, MD w/ cardiac clearance in 06-08-19 office note per Berniece Salines, DO  PPM/ICD -  Device Orders -  Rep Notified -   Chest x-ray -  EKG - 06-08-19 Stress Test -  ECHO -  Cardiac Cath -   Sleep Study -  CPAP -   Fasting Blood Sugar -  Checks Blood Sugar _____ times a day  Blood Thinner Instructions: Aspirin Instructions: 81 mg (Pt was given no instructions to discontinue med)  ERAS Protcol - PRE-SURGERY Ensure or G2-   COVID TEST-  COVID VACCINATION X 1  Anesthesia review:   Patient denies shortness of breath, fever, cough and chest pain at PAT appointment   All instructions explained to the patient, with a verbal understanding of the material. Patient agrees to go over the instructions while at home for a better understanding. Patient also instructed to self quarantine after being tested for COVID-19. The opportunity to ask questions was provided.

## 2019-07-31 NOTE — Patient Instructions (Addendum)
DUE TO COVID-19 ONLY ONE VISITOR IS ALLOWED TO COME WITH YOU AND STAY IN THE WAITING ROOM ONLY DURING PRE OP AND PROCEDURE DAY OF SURGERY. THE 1 VISITOR MAY VISIT WITH YOU AFTER SURGERY IN YOUR PRIVATE ROOM DURING VISITING HOURS ONLY!  YOU NEED TO HAVE A COVID 19 TEST ON 08-10-20 @ 2:55 PM, THIS TEST MUST BE DONE BEFORE SURGERY, COME  Megan Waller, Megan Waller , [id number].  ([place]) ONCE YOUR COVID TEST IS COMPLETED, PLEASE BEGIN THE QUARANTINE INSTRUCTIONS AS OUTLINED IN YOUR HANDOUT.                Megan Waller Megan Waller  07/31/2019   Your procedure is scheduled on: 08-15-19   Report to [facility] Main  Entrance    Report to Admitting at 8:00 AM    Call this number if you have problems the morning of surgery [phone]    Remember: Do not eat food or drink liquids :After Midnight.     Take these medicines the morning of surgery with A SIP OF WATER: Carvedilol (Coreg), Diltiazem (Cardizem), Duloxetine (Cymbalta), and Tapazole.  BRUSH YOUR TEETH MORNING OF SURGERY AND RINSE YOUR MOUTH OUT, NO CHEWING GUM CANDY OR MINTS.                                You may not have any metal on your body including hair pins and              piercings    Do not wear jewelry, cologne, lotions, powders or deodorant                      Donot wear nail polish on fingernails. Donot shave 48 hours prior to the procedure.    Do not bring valuables to the hospital. [place].  Contacts, dentures or bridgework may not be worn into surgery.  You may bring overnight bag      Special Instructions: N/A              Please read over the following fact sheets you were given: _____________________________________________________________________             [facility] - Preparing for Surgery Before surgery, you can play an important role.  Because skin is not sterile, your skin needs to be as free of germs as possible.   You can reduce the number of germs on your skin by washing with CHG (chlorahexidine gluconate) soap before surgery.  CHG is an antiseptic cleaner which kills germs and bonds with the skin to continue killing germs even after washing. Please DO NOT use if you have an allergy to CHG or antibacterial soaps.  If your skin becomes reddened/irritated stop using the CHG and inform your nurse when you arrive at Short Stay. Do not shave (including legs and underarms) for at least 48 hours prior to the first CHG shower.  You may shave your face/neck. Please follow these instructions carefully:  1.  Shower with CHG Soap the night before surgery and the  morning of Surgery.  2.  If you choose to wash your hair, wash your hair first as usual with your  normal  shampoo.  3.  After you shampoo, rinse your hair and body thoroughly to remove the  shampoo.                           4.  Use CHG as you would any other liquid soap.  You can apply chg directly  to the skin and wash                       Gently with a scrungie or clean washcloth.  5.  Apply the CHG Soap to your body ONLY FROM THE NECK DOWN.   Do not use on face/ open                           Wound or open sores. Avoid contact with eyes, ears mouth and genitals (private parts).                       Wash face,  Genitals (private parts) with your normal soap.             6.  Wash thoroughly, paying special attention to the area where your surgery  will be performed.  7.  Thoroughly rinse your body with warm water from the neck down.  8.  DO NOT shower/wash with your normal soap after using and rinsing off  the CHG Soap.                9.  Pat yourself dry with a clean towel.            10.  Wear clean pajamas.            11.  Place clean sheets on your bed the night of your first shower and do not  sleep with pets. Day of Surgery : Do not apply any lotions/deodorants the morning of surgery.  Please wear clean clothes to the hospital/surgery  center.  FAILURE TO FOLLOW THESE INSTRUCTIONS MAY RESULT IN THE CANCELLATION OF YOUR SURGERY PATIENT SIGNATURE_________________________________  NURSE SIGNATURE__________________________________  ________________________________________________________________________   Megan Waller  An incentive spirometer is a tool that can help keep your lungs clear and active. This tool measures how well you are filling your lungs with each breath. Taking long deep breaths may help reverse or decrease the chance of developing breathing (pulmonary) problems (especially infection) following:  A long period of time when you are unable to move or be active. BEFORE THE PROCEDURE   If the spirometer includes an indicator to show your best effort, your nurse or respiratory therapist will set it to a desired goal.  If possible, sit up straight or lean slightly forward. Try not to slouch.  Hold the incentive spirometer in an upright position. INSTRUCTIONS FOR USE  1. Sit on the edge of your bed if possible, or sit up as far as you can in bed or on a chair. 2. Hold the incentive spirometer in an upright position. 3. Breathe out normally. 4. Place the mouthpiece in your mouth and seal your lips tightly around it. 5. Breathe in slowly and as deeply as possible, raising the piston or the ball toward the top of the column. 6. Hold your breath for 3-5 seconds or for as long as possible. Allow the piston or ball to fall to the bottom of the column. 7. Remove the mouthpiece from your mouth and breathe out normally. 8. Rest for a few seconds and repeat Steps 1 through 7 at least 10 times every  1-2 hours when you are awake. Take your time and take a few normal breaths between deep breaths. 9. The spirometer may include an indicator to show your best effort. Use the indicator as a goal to work toward during each repetition. 10. After each set of 10 deep breaths, practice coughing to be sure your lungs are  clear. If you have an incision (the cut made at the time of surgery), support your incision when coughing by placing a pillow or rolled up towels firmly against it. Once you are able to get out of bed, walk around indoors and cough well. You may stop using the incentive spirometer when instructed by your caregiver.  RISKS AND COMPLICATIONS  Take your time so you do not get dizzy or light-headed.  If you are in pain, you may need to take or ask for pain medication before doing incentive spirometry. It is harder to take a deep breath if you are having pain. AFTER USE  Rest and breathe slowly and easily.  It can be helpful to keep track of a log of your progress. Your caregiver can provide you with a simple table to help with this. If you are using the spirometer at home, follow these instructions: [place] IF:   You are having difficultly using the spirometer.  You have trouble using the spirometer as often as instructed.  Your pain medication is not giving enough relief while using the spirometer.  You develop fever of 100.5 F (38.1 C) or higher. SEEK IMMEDIATE MEDICAL CARE IF:   You cough up bloody sputum that had not been present before.  You develop fever of 102 F (38.9 C) or greater.  You develop worsening pain at or near the incision site. MAKE SURE YOU:   Understand these instructions.  Will watch your condition.  Will get help right away if you are not doing well or get worse. Document Released: 06/22/2006 Document Revised: 05/04/2011 Document Reviewed: 08/23/2006 Exit[employer] Patient Information 2014 [employer], [place].   ________________________________________________________________________  WHAT IS A BLOOD TRANSFUSION? Blood Transfusion Information  A transfusion is the replacement of blood or some of its parts. Blood is made up of multiple cells which provide different functions.  Red blood cells carry oxygen and are used for blood loss  replacement.  White blood cells fight against infection.  Platelets control bleeding.  Plasma helps clot blood.  Other blood products are available for specialized needs, such as hemophilia or other clotting disorders. BEFORE THE TRANSFUSION  Who gives blood for transfusions?   Healthy volunteers who are fully evaluated to make sure their blood is safe. This is blood bank blood. Transfusion therapy is the safest it has ever been in the practice of medicine. Before blood is taken from a donor, a complete history is taken to make sure that person has no history of diseases nor engages in risky social behavior (examples are intravenous drug use or sexual activity with multiple partners). The donor's travel history is screened to minimize risk of transmitting infections, such as malaria. The donated blood is tested for signs of infectious diseases, such as HIV and hepatitis. The blood is then tested to be sure it is compatible with you in order to minimize the chance of a transfusion reaction. If you or a relative donates blood, this is often done in anticipation of surgery and is not appropriate for emergency situations. It takes many days to process the donated blood. RISKS AND COMPLICATIONS Although transfusion therapy is very safe  and saves many lives, the main dangers of transfusion include:   Getting an infectious disease.  Developing a transfusion reaction. This is an allergic reaction to something in the blood you were given. Every precaution is taken to prevent this. The decision to have a blood transfusion has been considered carefully by your caregiver before blood is given. Blood is not given unless the benefits outweigh the risks. AFTER THE TRANSFUSION  Right after receiving a blood transfusion, you will usually feel much better and more energetic. This is especially true if your red blood cells have gotten low (anemic). The transfusion raises the level of the red blood cells which  carry oxygen, and this usually causes an energy increase.  The nurse administering the transfusion will monitor you carefully for complications. HOME CARE INSTRUCTIONS  No special instructions are needed after a transfusion. You may find your energy is better. Speak with your caregiver about any limitations on activity for underlying diseases you may have. SEEK MEDICAL CARE IF:   Your condition is not improving after your transfusion.  You develop redness or irritation at the intravenous (IV) site. SEEK IMMEDIATE MEDICAL CARE IF:  Any of the following symptoms occur over the next 12 hours:  Shaking chills.  You have a temperature by mouth above 102 F (38.9 C), not controlled by medicine.  Chest, back, or muscle pain.  People around you feel you are not acting correctly or are confused.  Shortness of breath or difficulty breathing.  Dizziness and fainting.  You get a rash or develop hives.  You have a decrease in urine output.  Your urine turns a dark color or changes to pink, red, or brown. Any of the following symptoms occur over the next 10 days:  You have a temperature by mouth above 102 F (38.9 C), not controlled by medicine.  Shortness of breath.  Weakness after normal activity.  The white part of the eye turns yellow (jaundice).  You have a decrease in the amount of urine or are urinating less often.  Your urine turns a dark color or changes to pink, red, or brown. Document Released: 02/07/2000 Document Revised: 05/04/2011 Document Reviewed: 09/26/2007 [facility] Patient Information 2014 [place], [place].  _______________________________________________________________________

## 2019-08-01 ENCOUNTER — Encounter (HOSPITAL_COMMUNITY): Payer: Self-pay

## 2019-08-01 ENCOUNTER — Other Ambulatory Visit: Payer: Self-pay

## 2019-08-01 ENCOUNTER — Ambulatory Visit (INDEPENDENT_AMBULATORY_CARE_PROVIDER_SITE_OTHER): Payer: Medicare Other | Admitting: Cardiology

## 2019-08-01 ENCOUNTER — Encounter: Payer: Self-pay | Admitting: Cardiology

## 2019-08-01 ENCOUNTER — Encounter (HOSPITAL_COMMUNITY)
Admission: RE | Admit: 2019-08-01 | Discharge: 2019-08-01 | Disposition: A | Discharge status: Home / Self Care | Payer: Medicare Other | Source: Ambulatory Visit | Attending: Gynecologic Oncology | Admitting: Gynecologic Oncology

## 2019-08-01 VITALS — BP 150/66 | HR 65 | Ht 62.0 in | Wt 142.2 lb

## 2019-08-01 DIAGNOSIS — Z01810 Encounter for preprocedural cardiovascular examination: Secondary | ICD-10-CM

## 2019-08-01 DIAGNOSIS — I480 Paroxysmal atrial fibrillation: Secondary | ICD-10-CM | POA: Diagnosis not present

## 2019-08-01 DIAGNOSIS — I10 Essential (primary) hypertension: Secondary | ICD-10-CM

## 2019-08-01 DIAGNOSIS — E782 Mixed hyperlipidemia: Secondary | ICD-10-CM

## 2019-08-01 HISTORY — DX: Other complications of anesthesia, initial encounter: T88.59XA

## 2019-08-01 NOTE — Patient Instructions (Signed)
Medication Instructions:  Your physician recommends that you continue on your current medications as directed. Please refer to the Current Medication list given to you today.  *If you need a refill on your cardiac medications before your next appointment, please call your pharmacy*   Lab Work: None.  If you have labs (blood work) drawn today and your tests are completely normal, you will receive your results only by: Marland Kitchen MyChart Message (if you have MyChart) OR . A paper copy in the mail If you have any lab test that is abnormal or we need to change your treatment, we will call you to review the results.   Testing/Procedures: None.    Follow-Up: At [facility], you and your health needs are our priority.  As part of our continuing mission to provide you with exceptional heart care, we have created designated Provider Care Teams.  These Care Teams include your primary Cardiologist (physician) and Advanced Practice Providers (APPs -  Physician Assistants and Nurse Practitioners) who all work together to provide you with the care you need, when you need it.  We recommend signing up for the patient portal called "MyChart".  Sign up information is provided on this After Visit Summary.  MyChart is used to connect with patients for Virtual Visits (Telemedicine).  Patients are able to view lab/test results, encounter notes, upcoming appointments, etc.  Non-urgent messages can be sent to your provider as well.   To learn more about what you can do with MyChart, go to [email or web address].    Your next appointment:   2 month(s)  The format for your next appointment:   In Person  Provider:   Berniece Salines, DO   Other Instructions

## 2019-08-01 NOTE — Progress Notes (Signed)
Cardiology Office Note:    Date:  08/01/2019   ID:  Megan Waller, DOB 21-Mar-1940, MRN [id number]  PCP:  Marco Collie, MD  Cardiologist:  Jenean Lindau, MD  Electrophysiologist:  None   Referring MD: Marco Collie, MD   " I am doing alright"  History of Present Illness:    Megan Waller is a 78 y.o. female with a hx of stage IV ovarian cancer status post chemotherapy then recurrences of cancer now she has  received 1 of 3 cycles of carboplatin/paclitaxel chemotherapy, atrial fibrillation not currently on anticoagulation takes aspirin daily, hypertension, hyperlipidemia, anxiety.   I last saw the patient on 06/08/2019 at time she she was planning surgery.  At that time she had not interested labs and therefore went home for her surgery.  She has completed 3 cycles of chemotherapy and is now ready for surgical procedure.  She is scheduled for a total hysterectomy (removal of uterus or cervix) either robotically or open, bilateral salpingo-oophorectomy, omentectomy, tumor debulking, hernia repair with Dr. Rosendo Gros on June 22,2021.  Complaints at this time.  Denies any chest pain, shortness of breath, nausea, vomiting, dizziness.  The patient lives herself and reports that she does all her cooking cleaning.  She is here in the office today with her daughter  Past Medical History:  Diagnosis Date  . Abdominal hernia   . Acute on chronic respiratory failure with hypoxia ([place]) 03/25/2019  . Anxiety   . Anxiety   . Atrial fibrillation ([place]) 03/24/2019  . Chest pressure 09/18/2015  . Community acquired bilateral lower lobe pneumonia 03/24/2019  . Complication of anesthesia   . COPD (chronic obstructive pulmonary disease) ([place]) 03/27/2019  . Diverticulitis   . Essential hypertension 09/18/2015  . Family history of breast cancer   . Family history of colon cancer   . Family history of melanoma   . Family history of prostate cancer   . GAD (generalized anxiety disorder)  12/01/2015  . Glaucoma   . Hypercholesteremia   . Hypertension   . Hyperthyroidism   . Hypokalemia 09/18/2015  . Ovarian cancer ([place])   . Parkinson disease ([place]) 12/01/2015  . Primary insomnia 12/01/2015  . Skin cancer    squamous cell  . Weakness 09/18/2015    Past Surgical History:  Procedure Laterality Date  . BREAST LUMPECTOMY    . PARTIAL COLECTOMY     had partial colectomy for diverticular disease, required an ostomy followed by stoma revision  . right hand surgery    . squamous cell skin cancer resections     3    Current Medications: Current Meds  Medication Sig  . aspirin 81 MG EC tablet Take by mouth.  . carvedilol (COREG) 25 MG tablet Take 25 mg by mouth 2 (two) times daily.  Marland Kitchen diltiazem (CARDIZEM CD) 120 MG 24 hr capsule Take 120 mg by mouth daily.  . DULoxetine (CYMBALTA) 20 MG capsule Take 40 mg by mouth daily.  . furosemide (LASIX) 40 MG tablet Take 40 mg by mouth daily.  Marland Kitchen ibuprofen (ADVIL) 600 MG tablet Take 1 tablet (600 mg total) by mouth every 8 (eight) hours as needed for moderate pain. For AFTER surgery only  . methimazole (TAPAZOLE) 5 MG tablet Take 5 mg by mouth daily.   Marland Kitchen oxyCODONE (OXY IR/ROXICODONE) 5 MG immediate release tablet Take 1 tablet (5 mg total) by mouth every 4 (four) hours as needed for severe pain. For AFTER surgery, do not take and  drive  . potassium chloride SA (KLOR-CON M20) 20 MEQ tablet Take 20 mEq by mouth daily.  . rosuvastatin (CRESTOR) 10 MG tablet Take 10 mg by mouth every Monday.   . senna-docusate (SENOKOT-S) 8.6-50 MG tablet Take 2 tablets by mouth at bedtime. For AFTER surgery, do not take if having diarrhea  . Travoprost, BAK Free, (TRAVATAN) 0.004 % SOLN ophthalmic solution Place 1 drop into both eyes at bedtime.      Allergies:   Ezetimibe-simvastatin   Social History   Socioeconomic History  . Marital status: Married    Spouse name: Not on file  . Number of children: Not on file  . Years of education: Not on file    . Highest education level: Not on file  Occupational History  . Not on file  Tobacco Use  . Smoking status: Former Research scientist (life sciences)  . Smokeless tobacco: Never Used  . Tobacco comment: Quit 29 years ago  Substance and Sexual Activity  . Alcohol use: Never  . Drug use: Never  . Sexual activity: Not on file  Other Topics Concern  . Not on file  Social History Narrative  . Not on file   Social Determinants of Health   Financial Resource Strain:   . Difficulty of Paying Living Expenses:   Food Insecurity:   . Worried About Charity fundraiser in the Last Year:   . Arboriculturist in the Last Year:   Transportation Needs:   . Film/video editor (Medical):   Marland Kitchen Lack of Transportation (Non-Medical):   Physical Activity:   . Days of Exercise per Week:   . Minutes of Exercise per Session:   Stress:   . Feeling of Stress :   Social Connections:   . Frequency of Communication with Friends and Family:   . Frequency of Social Gatherings with Friends and Family:   . Attends Religious Services:   . Active Member of Clubs or Organizations:   . Attends Archivist Meetings:   Marland Kitchen Marital Status:      Family History: The patient's family history includes Breast cancer in her cousin, cousin, and cousin; Breast cancer (age of onset: 28) in her sister and sister; Breast cancer (age of onset: 60) in her niece; Breast cancer (age of onset: 25) in an other family member; Cancer in her paternal uncle; Colon cancer in her paternal aunt and paternal aunt; Colon cancer (age of onset: 26) in her brother; Heart attack in her father; Melanoma in her brother; Melanoma (age of onset: 65) in her brother; Prostate cancer (age of onset: 94) in her brother; Skin cancer in her father; Stroke in her mother; Throat cancer in her paternal aunt.  ROS:   Review of Systems  Constitution: Negative for decreased appetite, fever and weight gain.  HENT: Negative for congestion, ear discharge, hoarse voice and sore  throat.   Eyes: Negative for discharge, redness, vision loss in right eye and visual halos.  Cardiovascular: Negative for chest pain, dyspnea on exertion, leg swelling, orthopnea and palpitations.  Respiratory: Negative for cough, hemoptysis, shortness of breath and snoring.   Endocrine: Negative for heat intolerance and polyphagia.  Hematologic/Lymphatic: Negative for bleeding problem. Does not bruise/bleed easily.  Skin: Negative for flushing, nail changes, rash and suspicious lesions.  Musculoskeletal: Negative for arthritis, joint pain, muscle cramps, myalgias, neck pain and stiffness.  Gastrointestinal: Negative for abdominal pain, bowel incontinence, diarrhea and excessive appetite.  Genitourinary: Negative for decreased libido, genital sores and incomplete  emptying.  Neurological: Negative for brief paralysis, focal weakness, headaches and loss of balance.  Psychiatric/Behavioral: Negative for altered mental status, depression and suicidal ideas.  Allergic/Immunologic: Negative for HIV exposure and persistent infections.    EKGs/Labs/Other Studies Reviewed:    The following studies were reviewed today:   EKG:  The ekg ordered today demonstrates sinus rhythm, heart rate 65 bpm compared to prior EKG no significant change.  Pharmacologic stress test performed in 2017 via [facility]: No reversible ischemia or infarction.  No left ventricular wall motion.  Left ventricle ejection fraction 82%.  Normal study.   TTE performed at [facility] 02/2019 FINDINGS Procedure:2D images, m-mode, color and spectral Doppler were obtained and reviewed. ECG rhythm:Sinus rhythm. Study quality:This was a technically adequate study to  a technically difficult study. Left Ventricle:The left ventricular size is normal.   There is mild concentric left ventricular hypertrophy.   There is normal global left ventricular contractility.   Overall left ventricular systolic function is normal with, an  EF between 60 - 65 %.   The diastolic filling pattern is normal for the age of the patient.   No regional wall motion abnormalities were noted. Right Ventricle:The right ventricle is normal in size and function.   The RV was not well visualized. Left Atrium:Left atrium is mildly dilated by volume. Right Atrium:The right atrium is normal in size and function.   The right atrium was not well visualized. ASD/VSD:Interatrial and interventricular septum intact. Aortic Valve:The aortic valve is trileaflet, and appears structurally normal. No aortic stenosis or regurgitation. Mitral Valve:Normal appearing mitral valve.    There is trace mitral regurgitation. Tricuspid Valve:The tricuspid valve appears structurally normal.   Mild tricuspid regurgitation present.   The right ventricular systolic pressure, as measured by Doppler, is 31 mmhg. Pulmonic Valve:The pulmonic valve is normal.   Trace/mild (physiologic)  pulmonic regurgitation. Aorta:The aortic root, ascending aorta and aortic arch appear normal. YYT:KPTWSF inferior vena cava with normal inspiratory collapse. Pulmonary Veins:The flow patterns, measured by Doppler, appear normal. Pericardium:The pericardium is normal.   There is no pericardial effusion.  CONCLUSIONS ----------- 1. There is mild concentric left ventricular hypertrophy. 2. There is normal global left ventricular contractility. 3. Overall left ventricular systolic function is normal with, an EF between 60 - 65 %. 4. The diastolic filling pattern is normal for the age of the patient. 5. No regional wall motion abnormalities were noted. 6. The right ventricle is normal in size and function. 7. Left atrium is mildly dilated by volume. 8. The pericardium is normal.  CT done at [facility] 05/29/19 1. Interval reduction in size of bilateral ovarian masses, ovaries now measuring within normal limits, essentially complete resolution of previously seen extensive  metastatic omental and peritoneal nodularity about the abdomen and pelvis, and resolution of bilateral inguinal lymphadenopathy. Findings are consistent with treatment response, without CT evidence of residual disease in the abdomen or pelvis. 2.  Cholelithiasis. 3. Parastomal hernia of the left lower quadrant containing nonobstructed loops of transverse colon and mid small bowel. 4.  Aortic Atherosclerosis (ICD10-I70.0).   Recent Labs: No results found for requested labs within last 8760 hours.  Recent Lipid Panel No results found for: CHOL, TRIG, HDL, CHOLHDL, VLDL, LDLCALC, LDLDIRECT  Physical Exam:    VS:  BP (!) 150/66 (BP Location: Left Arm, Patient Position: Sitting, Cuff Size: Normal)   Pulse 65   Ht 5\' 2"  (1.575 m)   Wt 142 lb 3.2 oz (64.5 kg)  SpO2 94% Comment: at rest  BMI 26.01 kg/m     Wt Readings from Last 3 Encounters:  08/01/19 142 lb 3.2 oz (64.5 kg)  08/01/19 139 lb (63 kg)  07/25/19 140 lb (63.5 kg)     GEN: Well nourished, well developed in no acute distress HEENT: Normal NECK: No JVD; No carotid bruits LYMPHATICS: No lymphadenopathy CARDIAC: S1S2 noted,RRR, no murmurs, rubs, gallops RESPIRATORY:  Clear to auscultation without rales, wheezing or rhonchi  ABDOMEN: Soft, non-tender, non-distended, +bowel sounds, no guarding. EXTREMITIES: No edema, No cyanosis, no clubbing MUSCULOSKELETAL:  No deformity  SKIN: Warm and dry NEUROLOGIC:  Alert and oriented x 3, non-focal PSYCHIATRIC:  Normal affect, good insight  ASSESSMENT:    1. Paroxysmal atrial fibrillation (HCC)   2. Essential hypertension   3. Mixed hyperlipidemia   4. Preoperative cardiovascular examination    PLAN:     She is in sinus rhythm today.  Pain is controlled.  We will continue patient on carvedilol 25 mg twice a day, Cardizem 120 mg daily.  She is not ready to start anticoagulation given her upcoming surgery.  For now keep the patient aspirin 81 mg daily.  Okay to stop the  aspirin 7 days prior to her surgical procedure.  Hypertension-we will continue the patient her current antihypertensive regimen.  Hyperlipidemia-continue patient on current statin dose.  LDL 131, total cholesterol 179, triglyceride 92, HDL 53.  We will discuss postoperatively the need to increase her statin this past  In terms of her preoperative cardiac examination, the patient does not have any unstable cardiac conditions.  Upon evaluation today, she can achieve 4 METs or greater without anginal symptoms.  According to [facility] and AHA guidelines, she requires no further cardiac workup prior to she noncardiac surgery and should be at acceptable risk.  Given her frailty if the patient does not undergo her surgery within the next 4-6 weeks she may need to be evaluated again prior to surgery. In addition I did speak with the patient and her daughter in together that if the patient experiences and symptoms concerning for angina prior to her procedure to notify my office.     The patient is in agreement with the above plan. The patient left the office in stable condition.  The patient will follow up in 2 months or sooner if needed.  Medication Adjustments/Labs and Tests Ordered: Current medicines are reviewed at length with the patient today.  Concerns regarding medicines are outlined above.  Orders Placed This Encounter  Procedures  . EKG 12-Lead   No orders of the defined types were placed in this encounter.   Patient Instructions  Medication Instructions:  Your physician recommends that you continue on your current medications as directed. Please refer to the Current Medication list given to you today.  *If you need a refill on your cardiac medications before your next appointment, please call your pharmacy*   Lab Work: None.  If you have labs (blood work) drawn today and your tests are completely normal, you will receive your results only by: Marland Kitchen MyChart Message (if you have MyChart)  OR . A paper copy in the mail If you have any lab test that is abnormal or we need to change your treatment, we will call you to review the results.   Testing/Procedures: None.    Follow-Up: At [facility], you and your health needs are our priority.  As part of our continuing mission to provide you with exceptional heart care, we have  created designated Provider Care Teams.  These Care Teams include your primary Cardiologist (physician) and Advanced Practice Providers (APPs -  Physician Assistants and Nurse Practitioners) who all work together to provide you with the care you need, when you need it.  We recommend signing up for the patient portal called "MyChart".  Sign up information is provided on this After Visit Summary.  MyChart is used to connect with patients for Virtual Visits (Telemedicine).  Patients are able to view lab/test results, encounter notes, upcoming appointments, etc.  Non-urgent messages can be sent to your provider as well.   To learn more about what you can do with MyChart, go to [email or web address].    Your next appointment:   2 month(s)  The format for your next appointment:   In Person  Provider:   Berniece Salines, DO   Other Instructions      Adopting a Healthy Lifestyle.  Know what a healthy weight is for you (roughly BMI <25) and aim to maintain this   Aim for 7+ servings of fruits and vegetables daily   65-80+ fluid ounces of water or unsweet tea for healthy kidneys   Limit to max 1 drink of alcohol per day; avoid smoking/tobacco   Limit animal fats in diet for cholesterol and heart health - choose grass fed whenever available   Avoid highly processed foods, and foods high in saturated/trans fats   Aim for low stress - take time to unwind and care for your mental health   Aim for 150 min of moderate intensity exercise weekly for heart health, and weights twice weekly for bone health   Aim for 7-9 hours of sleep daily   When it  comes to diets, agreement about the perfect plan isnt easy to find, even among the experts. Experts at the [place] developed an idea known as the Healthy Eating Plate. Just imagine a plate divided into logical, healthy portions.   The emphasis is on diet quality:   Load up on vegetables and fruits - one-half of your plate: Aim for color and variety, and remember that potatoes dont count.   Go for whole grains - one-quarter of your plate: Whole wheat, barley, wheat berries, quinoa, oats, brown rice, and foods made with them. If you want pasta, go with whole wheat pasta.   Protein power - one-quarter of your plate: Fish, chicken, beans, and nuts are all healthy, versatile protein sources. Limit red meat.   The diet, however, does go beyond the plate, offering a few other suggestions.   Use healthy plant oils, such as olive, canola, soy, corn, sunflower and peanut. Check the labels, and avoid partially hydrogenated oil, which have unhealthy trans fats.   If youre thirsty, drink water. Coffee and tea are good in moderation, but skip sugary drinks and limit milk and dairy products to one or two daily servings.   The type of carbohydrate in the diet is more important than the amount. Some sources of carbohydrates, such as vegetables, fruits, whole grains, and beans-are healthier than others.   Finally, stay active  Signed, Berniece Salines, DO  08/01/2019 1:44 PM    [facility]

## 2019-08-10 DIAGNOSIS — C569 Malignant neoplasm of unspecified ovary: Secondary | ICD-10-CM

## 2019-08-11 ENCOUNTER — Encounter (HOSPITAL_COMMUNITY)
Admission: RE | Admit: 2019-08-11 | Discharge: 2019-08-11 | Disposition: A | Discharge status: Home / Self Care | Payer: Medicare Other | Source: Ambulatory Visit | Attending: Gynecologic Oncology | Admitting: Gynecologic Oncology

## 2019-08-11 ENCOUNTER — Other Ambulatory Visit (HOSPITAL_COMMUNITY)
Admission: RE | Admit: 2019-08-11 | Discharge: 2019-08-11 | Disposition: A | Discharge status: Home / Self Care | Payer: Medicare Other | Source: Ambulatory Visit | Attending: Gynecologic Oncology | Admitting: Gynecologic Oncology

## 2019-08-11 ENCOUNTER — Other Ambulatory Visit: Payer: Self-pay

## 2019-08-11 DIAGNOSIS — Z20822 Contact with and (suspected) exposure to covid-19: Secondary | ICD-10-CM | POA: Diagnosis not present

## 2019-08-11 DIAGNOSIS — Z01812 Encounter for preprocedural laboratory examination: Secondary | ICD-10-CM | POA: Diagnosis present

## 2019-08-11 LAB — URINALYSIS, ROUTINE W REFLEX MICROSCOPIC
Bacteria, UA: NONE SEEN
Bilirubin Urine: NEGATIVE
Glucose, UA: NEGATIVE mg/dL
Hgb urine dipstick: NEGATIVE
Ketones, ur: NEGATIVE mg/dL
Nitrite: NEGATIVE
Protein, ur: NEGATIVE mg/dL
Specific Gravity, Urine: 1.023 (ref 1.005–1.030)
pH: 5 (ref 5.0–8.0)

## 2019-08-11 LAB — CBC WITH DIFFERENTIAL/PLATELET
Abs Immature Granulocytes: 0.02 10*3/uL (ref 0.00–0.07)
Basophils Absolute: 0.1 10*3/uL (ref 0.0–0.1)
Basophils Relative: 1 %
Eosinophils Absolute: 0.5 10*3/uL (ref 0.0–0.5)
Eosinophils Relative: 8 %
HCT: 36.1 % (ref 36.0–46.0)
Hemoglobin: 11.4 g/dL — ABNORMAL LOW (ref 12.0–15.0)
Immature Granulocytes: 0 %
Lymphocytes Relative: 37 %
Lymphs Abs: 2.3 10*3/uL (ref 0.7–4.0)
MCH: 29.9 pg (ref 26.0–34.0)
MCHC: 31.6 g/dL (ref 30.0–36.0)
MCV: 94.8 fL (ref 80.0–100.0)
Monocytes Absolute: 0.5 10*3/uL (ref 0.1–1.0)
Monocytes Relative: 8 %
Neutro Abs: 2.9 10*3/uL (ref 1.7–7.7)
Neutrophils Relative %: 46 %
Platelets: 136 10*3/uL — ABNORMAL LOW (ref 150–400)
RBC: 3.81 MIL/uL — ABNORMAL LOW (ref 3.87–5.11)
RDW: 14.1 % (ref 11.5–15.5)
WBC: 6.3 10*3/uL (ref 4.0–10.5)
nRBC: 0 % (ref 0.0–0.2)

## 2019-08-11 LAB — COMPREHENSIVE METABOLIC PANEL
ALT: 12 U/L (ref 0–44)
AST: 14 U/L — ABNORMAL LOW (ref 15–41)
Albumin: 3.7 g/dL (ref 3.5–5.0)
Alkaline Phosphatase: 73 U/L (ref 38–126)
Anion gap: 12 (ref 5–15)
BUN: 13 mg/dL (ref 8–23)
CO2: 29 mmol/L (ref 22–32)
Calcium: 8.9 mg/dL (ref 8.9–10.3)
Chloride: 103 mmol/L (ref 98–111)
Creatinine, Ser: 0.68 mg/dL (ref 0.44–1.00)
GFR calc Af Amer: >60 mL/min (ref >60)
GFR calc non Af Amer: >60 mL/min (ref >60)
Glucose, Bld: 141 mg/dL — ABNORMAL HIGH (ref 70–99)
Potassium: 3.4 mmol/L — ABNORMAL LOW (ref 3.5–5.1)
Sodium: 144 mmol/L (ref 135–145)
Total Bilirubin: 0.7 mg/dL (ref 0.3–1.2)
Total Protein: 6.8 g/dL (ref 6.5–8.1)

## 2019-08-11 LAB — ABO/RH: ABO/RH(D): O POS

## 2019-08-12 LAB — SARS CORONAVIRUS 2 (TAT 6-24 HRS): SARS Coronavirus 2: NEGATIVE

## 2019-08-14 ENCOUNTER — Telehealth: Payer: Self-pay

## 2019-08-14 NOTE — Telephone Encounter (Signed)
Spoke with daughter [name]. She said that she and her mother understood the written  pre-op instruction. No questions or concerns noted at this time.

## 2019-08-15 ENCOUNTER — Inpatient Hospital Stay (HOSPITAL_COMMUNITY): Payer: Medicare Other | Admitting: Registered Nurse

## 2019-08-15 ENCOUNTER — Encounter (HOSPITAL_COMMUNITY)
Admission: RE | Disposition: A | Discharge status: Home / Self Care | Payer: Self-pay | Source: Home / Self Care | Attending: Gynecologic Oncology

## 2019-08-15 ENCOUNTER — Other Ambulatory Visit: Payer: Self-pay

## 2019-08-15 ENCOUNTER — Encounter (HOSPITAL_COMMUNITY): Payer: Self-pay | Admitting: Gynecologic Oncology

## 2019-08-15 ENCOUNTER — Inpatient Hospital Stay (HOSPITAL_COMMUNITY)
Admission: RE | Admit: 2019-08-15 | Discharge: 2019-08-17 | DRG: 738 | Disposition: A | Discharge status: Home / Self Care | Payer: Medicare Other | Attending: Gynecologic Oncology | Admitting: Gynecologic Oncology

## 2019-08-15 DIAGNOSIS — I10 Essential (primary) hypertension: Secondary | ICD-10-CM | POA: Diagnosis present

## 2019-08-15 DIAGNOSIS — Z87891 Personal history of nicotine dependence: Secondary | ICD-10-CM

## 2019-08-15 DIAGNOSIS — C561 Malignant neoplasm of right ovary: Secondary | ICD-10-CM | POA: Diagnosis not present

## 2019-08-15 DIAGNOSIS — Z888 Allergy status to other drugs, medicaments and biological substances status: Secondary | ICD-10-CM | POA: Diagnosis not present

## 2019-08-15 DIAGNOSIS — E785 Hyperlipidemia, unspecified: Secondary | ICD-10-CM | POA: Diagnosis present

## 2019-08-15 DIAGNOSIS — E0590 Thyrotoxicosis, unspecified without thyrotoxic crisis or storm: Secondary | ICD-10-CM | POA: Diagnosis present

## 2019-08-15 DIAGNOSIS — C569 Malignant neoplasm of unspecified ovary: Principal | ICD-10-CM | POA: Diagnosis present

## 2019-08-15 DIAGNOSIS — Z8249 Family history of ischemic heart disease and other diseases of the circulatory system: Secondary | ICD-10-CM

## 2019-08-15 DIAGNOSIS — J449 Chronic obstructive pulmonary disease, unspecified: Secondary | ICD-10-CM | POA: Diagnosis present

## 2019-08-15 DIAGNOSIS — Z933 Colostomy status: Secondary | ICD-10-CM | POA: Diagnosis not present

## 2019-08-15 DIAGNOSIS — Z7982 Long term (current) use of aspirin: Secondary | ICD-10-CM

## 2019-08-15 DIAGNOSIS — H409 Unspecified glaucoma: Secondary | ICD-10-CM | POA: Diagnosis present

## 2019-08-15 DIAGNOSIS — K660 Peritoneal adhesions (postprocedural) (postinfection): Secondary | ICD-10-CM | POA: Diagnosis present

## 2019-08-15 DIAGNOSIS — Z85828 Personal history of other malignant neoplasm of skin: Secondary | ICD-10-CM | POA: Diagnosis not present

## 2019-08-15 DIAGNOSIS — N888 Other specified noninflammatory disorders of cervix uteri: Secondary | ICD-10-CM | POA: Diagnosis not present

## 2019-08-15 DIAGNOSIS — Z803 Family history of malignant neoplasm of breast: Secondary | ICD-10-CM | POA: Diagnosis not present

## 2019-08-15 DIAGNOSIS — F411 Generalized anxiety disorder: Secondary | ICD-10-CM | POA: Diagnosis present

## 2019-08-15 DIAGNOSIS — I4891 Unspecified atrial fibrillation: Secondary | ICD-10-CM | POA: Diagnosis present

## 2019-08-15 DIAGNOSIS — Z79899 Other long term (current) drug therapy: Secondary | ICD-10-CM

## 2019-08-15 DIAGNOSIS — D72829 Elevated white blood cell count, unspecified: Secondary | ICD-10-CM | POA: Diagnosis not present

## 2019-08-15 DIAGNOSIS — G20 Parkinson's disease: Secondary | ICD-10-CM | POA: Diagnosis present

## 2019-08-15 DIAGNOSIS — K432 Incisional hernia without obstruction or gangrene: Secondary | ICD-10-CM | POA: Diagnosis present

## 2019-08-15 DIAGNOSIS — D259 Leiomyoma of uterus, unspecified: Secondary | ICD-10-CM | POA: Diagnosis not present

## 2019-08-15 DIAGNOSIS — C562 Malignant neoplasm of left ovary: Secondary | ICD-10-CM | POA: Diagnosis not present

## 2019-08-15 DIAGNOSIS — C786 Secondary malignant neoplasm of retroperitoneum and peritoneum: Secondary | ICD-10-CM | POA: Diagnosis not present

## 2019-08-15 DIAGNOSIS — N840 Polyp of corpus uteri: Secondary | ICD-10-CM | POA: Diagnosis not present

## 2019-08-15 HISTORY — PX: LAPAROTOMY: SHX154

## 2019-08-15 HISTORY — PX: INCISIONAL HERNIA REPAIR: SHX193

## 2019-08-15 HISTORY — PX: LYSIS OF ADHESION: SHX5961

## 2019-08-15 LAB — TYPE AND SCREEN
ABO/RH(D): O POS
Antibody Screen: NEGATIVE

## 2019-08-15 SURGERY — XI ROBOTIC ASSISTED TOTAL HYSTERECTOMY BILATERAL SALPINGO OOPHORECTOMY WITH OMENTECTOMY AND DEBULKING
Anesthesia: General

## 2019-08-15 MED ORDER — ROSUVASTATIN CALCIUM 10 MG PO TABS: 10.0000 mg | ORAL_TABLET | ORAL | Status: DC

## 2019-08-15 MED ORDER — CHLORHEXIDINE GLUCONATE 0.12 % MT SOLN
15.0000 mL | Freq: Once | OROMUCOSAL | Status: AC
  Administered 2019-08-15: 15 mL via OROMUCOSAL

## 2019-08-15 MED ORDER — CHLORHEXIDINE GLUCONATE CLOTH 2 % EX PADS: 6.0000 | MEDICATED_PAD | Freq: Once | CUTANEOUS | Status: DC

## 2019-08-15 MED ORDER — ORAL CARE MOUTH RINSE: 15.0000 mL | Freq: Once | OROMUCOSAL | Status: AC

## 2019-08-15 MED ORDER — FENTANYL CITRATE (PF) 250 MCG/5ML IJ SOLN
INTRAMUSCULAR | Status: DC | PRN
  Administered 2019-08-15: 100 ug via INTRAVENOUS
  Administered 2019-08-15 (×3): 50 ug via INTRAVENOUS

## 2019-08-15 MED ORDER — FENTANYL CITRATE (PF) 250 MCG/5ML IJ SOLN
INTRAMUSCULAR | Status: AC
  Filled 2019-08-15: qty 5

## 2019-08-15 MED ORDER — ACETAMINOPHEN 500 MG PO TABS
1000.0000 mg | ORAL_TABLET | ORAL | Status: AC
  Administered 2019-08-15: 1000 mg via ORAL
  Filled 2019-08-15: qty 2

## 2019-08-15 MED ORDER — LIDOCAINE 2% (20 MG/ML) 5 ML SYRINGE
INTRAMUSCULAR | Status: AC
  Filled 2019-08-15: qty 5

## 2019-08-15 MED ORDER — SODIUM CHLORIDE (PF) 0.9 % IJ SOLN
INTRAMUSCULAR | Status: AC
  Filled 2019-08-15: qty 20

## 2019-08-15 MED ORDER — CEFAZOLIN SODIUM-DEXTROSE 2-4 GM/100ML-% IV SOLN
INTRAVENOUS | Status: AC
  Filled 2019-08-15: qty 100

## 2019-08-15 MED ORDER — PHENYLEPHRINE 40 MCG/ML (10ML) SYRINGE FOR IV PUSH (FOR BLOOD PRESSURE SUPPORT)
PREFILLED_SYRINGE | INTRAVENOUS | Status: AC
  Filled 2019-08-15: qty 10

## 2019-08-15 MED ORDER — ACETAMINOPHEN 500 MG PO TABS
1000.0000 mg | ORAL_TABLET | Freq: Two times a day (BID) | ORAL | Status: DC
  Administered 2019-08-15 – 2019-08-17 (×4): 1000 mg via ORAL
  Filled 2019-08-15 (×4): qty 2

## 2019-08-15 MED ORDER — DEXAMETHASONE SODIUM PHOSPHATE 10 MG/ML IJ SOLN
INTRAMUSCULAR | Status: DC | PRN
  Administered 2019-08-15: 10 mg via INTRAVENOUS

## 2019-08-15 MED ORDER — PROPOFOL 10 MG/ML IV BOLUS
INTRAVENOUS | Status: DC | PRN
  Administered 2019-08-15: 80 mg via INTRAVENOUS

## 2019-08-15 MED ORDER — CEFAZOLIN SODIUM-DEXTROSE 2-4 GM/100ML-% IV SOLN
2.0000 g | INTRAVENOUS | Status: AC
  Administered 2019-08-15 (×2): 2 g via INTRAVENOUS
  Filled 2019-08-15: qty 100

## 2019-08-15 MED ORDER — LACTATED RINGERS IV SOLN: INTRAVENOUS | Status: DC

## 2019-08-15 MED ORDER — TRAMADOL HCL 50 MG PO TABS: 100.0000 mg | ORAL_TABLET | Freq: Two times a day (BID) | ORAL | Status: DC | PRN

## 2019-08-15 MED ORDER — ROCURONIUM BROMIDE 10 MG/ML (PF) SYRINGE
PREFILLED_SYRINGE | INTRAVENOUS | Status: AC
  Filled 2019-08-15: qty 10

## 2019-08-15 MED ORDER — ALBUMIN HUMAN 5 % IV SOLN
INTRAVENOUS | Status: AC
  Filled 2019-08-15: qty 250

## 2019-08-15 MED ORDER — SENNOSIDES-DOCUSATE SODIUM 8.6-50 MG PO TABS
2.0000 | ORAL_TABLET | Freq: Every day | ORAL | Status: DC
  Administered 2019-08-15 – 2019-08-16 (×2): 2 via ORAL
  Filled 2019-08-15 (×2): qty 2

## 2019-08-15 MED ORDER — BUPIVACAINE HCL 0.25 % IJ SOLN
INTRAMUSCULAR | Status: DC | PRN
  Administered 2019-08-15: 35 mL

## 2019-08-15 MED ORDER — ENOXAPARIN SODIUM 40 MG/0.4ML ~~LOC~~ SOLN
40.0000 mg | SUBCUTANEOUS | Status: DC
  Administered 2019-08-16 – 2019-08-17 (×2): 40 mg via SUBCUTANEOUS
  Filled 2019-08-15 (×2): qty 0.4

## 2019-08-15 MED ORDER — OXYCODONE HCL 5 MG/5ML PO SOLN: 5.0000 mg | Freq: Once | ORAL | Status: DC | PRN

## 2019-08-15 MED ORDER — VISTASEAL 10 ML SINGLE DOSE KIT
10.0000 mL | PACK | Freq: Once | CUTANEOUS | Status: AC
  Administered 2019-08-15: 10 mL via TOPICAL
  Filled 2019-08-15: qty 10

## 2019-08-15 MED ORDER — PHENYLEPHRINE HCL (PRESSORS) 10 MG/ML IV SOLN
INTRAVENOUS | Status: AC
  Filled 2019-08-15: qty 1

## 2019-08-15 MED ORDER — MEPERIDINE HCL 50 MG/ML IJ SOLN: 6.2500 mg | INTRAMUSCULAR | Status: DC | PRN

## 2019-08-15 MED ORDER — PHENYLEPHRINE HCL-NACL 10-0.9 MG/250ML-% IV SOLN
INTRAVENOUS | Status: DC | PRN
  Administered 2019-08-15: 50 ug/min via INTRAVENOUS

## 2019-08-15 MED ORDER — LACTATED RINGERS IR SOLN
Status: DC | PRN
  Administered 2019-08-15: 1000 mL

## 2019-08-15 MED ORDER — IBUPROFEN 400 MG PO TABS
600.0000 mg | ORAL_TABLET | Freq: Four times a day (QID) | ORAL | Status: DC | PRN
  Administered 2019-08-16 – 2019-08-17 (×2): 600 mg via ORAL
  Filled 2019-08-15 (×2): qty 1

## 2019-08-15 MED ORDER — EPHEDRINE 5 MG/ML INJ
INTRAVENOUS | Status: AC
  Filled 2019-08-15: qty 10

## 2019-08-15 MED ORDER — HEPARIN SODIUM (PORCINE) 5000 UNIT/ML IJ SOLN
5000.0000 [IU] | INTRAMUSCULAR | Status: AC
  Administered 2019-08-15: 5000 [IU] via SUBCUTANEOUS
  Filled 2019-08-15: qty 1

## 2019-08-15 MED ORDER — ONDANSETRON HCL 4 MG/2ML IJ SOLN
INTRAMUSCULAR | Status: DC | PRN
  Administered 2019-08-15: 4 mg via INTRAVENOUS

## 2019-08-15 MED ORDER — PROPOFOL 10 MG/ML IV BOLUS
INTRAVENOUS | Status: AC
  Filled 2019-08-15: qty 20

## 2019-08-15 MED ORDER — SUGAMMADEX SODIUM 200 MG/2ML IV SOLN
INTRAVENOUS | Status: DC | PRN
  Administered 2019-08-15: 200 mg via INTRAVENOUS

## 2019-08-15 MED ORDER — ASPIRIN EC 81 MG PO TBEC
81.0000 mg | DELAYED_RELEASE_TABLET | Freq: Every day | ORAL | Status: DC
  Administered 2019-08-16 – 2019-08-17 (×2): 81 mg via ORAL
  Filled 2019-08-15 (×2): qty 1

## 2019-08-15 MED ORDER — PHENYLEPHRINE 40 MCG/ML (10ML) SYRINGE FOR IV PUSH (FOR BLOOD PRESSURE SUPPORT)
PREFILLED_SYRINGE | INTRAVENOUS | Status: DC | PRN
  Administered 2019-08-15 (×3): 80 ug via INTRAVENOUS

## 2019-08-15 MED ORDER — OXYCODONE HCL 5 MG PO TABS: 5.0000 mg | ORAL_TABLET | ORAL | Status: DC | PRN

## 2019-08-15 MED ORDER — DULOXETINE HCL 20 MG PO CPEP
40.0000 mg | ORAL_CAPSULE | Freq: Every day | ORAL | Status: DC
  Administered 2019-08-16 – 2019-08-17 (×2): 40 mg via ORAL
  Filled 2019-08-15 (×2): qty 2

## 2019-08-15 MED ORDER — DEXAMETHASONE SODIUM PHOSPHATE 10 MG/ML IJ SOLN
INTRAMUSCULAR | Status: AC
  Filled 2019-08-15: qty 1

## 2019-08-15 MED ORDER — STERILE WATER FOR IRRIGATION IR SOLN
Status: DC | PRN
  Administered 2019-08-15: 1000 mL

## 2019-08-15 MED ORDER — HYDROMORPHONE HCL 1 MG/ML IJ SOLN
INTRAMUSCULAR | Status: AC
  Filled 2019-08-15: qty 1

## 2019-08-15 MED ORDER — DILTIAZEM HCL ER COATED BEADS 120 MG PO CP24
120.0000 mg | ORAL_CAPSULE | Freq: Every day | ORAL | Status: DC
  Administered 2019-08-16 – 2019-08-17 (×2): 120 mg via ORAL
  Filled 2019-08-15 (×2): qty 1

## 2019-08-15 MED ORDER — KETAMINE HCL 10 MG/ML IJ SOLN
INTRAMUSCULAR | Status: DC | PRN
Start: 2019-08-15 — End: 2019-08-15
  Administered 2019-08-15: 25 mg via INTRAVENOUS

## 2019-08-15 MED ORDER — LIDOCAINE 2% (20 MG/ML) 5 ML SYRINGE
INTRAMUSCULAR | Status: DC | PRN
  Administered 2019-08-15: 20 mg via INTRAVENOUS

## 2019-08-15 MED ORDER — ONDANSETRON HCL 4 MG PO TABS: 4.0000 mg | ORAL_TABLET | Freq: Four times a day (QID) | ORAL | Status: DC | PRN

## 2019-08-15 MED ORDER — ONDANSETRON HCL 4 MG/2ML IJ SOLN
INTRAMUSCULAR | Status: AC
  Filled 2019-08-15: qty 2

## 2019-08-15 MED ORDER — LIDOCAINE 20MG/ML (2%) 15 ML SYRINGE OPTIME
INTRAMUSCULAR | Status: DC | PRN
  Administered 2019-08-15: 1.5 mg/kg/h via INTRAVENOUS

## 2019-08-15 MED ORDER — ROCURONIUM BROMIDE 10 MG/ML (PF) SYRINGE
PREFILLED_SYRINGE | INTRAVENOUS | Status: DC | PRN
  Administered 2019-08-15 (×2): 20 mg via INTRAVENOUS
  Administered 2019-08-15: 60 mg via INTRAVENOUS
  Administered 2019-08-15: 20 mg via INTRAVENOUS

## 2019-08-15 MED ORDER — KCL IN DEXTROSE-NACL 20-5-0.45 MEQ/L-%-% IV SOLN
INTRAVENOUS | Status: DC
  Filled 2019-08-15 (×2): qty 1000

## 2019-08-15 MED ORDER — EPHEDRINE SULFATE-NACL 50-0.9 MG/10ML-% IV SOSY
PREFILLED_SYRINGE | INTRAVENOUS | Status: DC | PRN
  Administered 2019-08-15 (×6): 10 mg via INTRAVENOUS

## 2019-08-15 MED ORDER — HYDROMORPHONE HCL 1 MG/ML IJ SOLN
0.2500 mg | INTRAMUSCULAR | Status: DC | PRN
  Administered 2019-08-15 (×2): 0.25 mg via INTRAVENOUS
  Administered 2019-08-15: 0.5 mg via INTRAVENOUS

## 2019-08-15 MED ORDER — MIDAZOLAM HCL 2 MG/2ML IJ SOLN: 0.5000 mg | Freq: Once | INTRAMUSCULAR | Status: DC | PRN

## 2019-08-15 MED ORDER — METHIMAZOLE 5 MG PO TABS
5.0000 mg | ORAL_TABLET | Freq: Every day | ORAL | Status: DC
  Administered 2019-08-16 – 2019-08-17 (×2): 5 mg via ORAL
  Filled 2019-08-15 (×2): qty 1

## 2019-08-15 MED ORDER — PROMETHAZINE HCL 25 MG/ML IJ SOLN: 6.2500 mg | INTRAMUSCULAR | Status: DC | PRN

## 2019-08-15 MED ORDER — ALBUMIN HUMAN 5 % IV SOLN: INTRAVENOUS | Status: DC | PRN

## 2019-08-15 MED ORDER — BUPIVACAINE LIPOSOME 1.3 % IJ SUSP
20.0000 mL | Freq: Once | INTRAMUSCULAR | Status: AC
  Administered 2019-08-15: 20 mL
  Filled 2019-08-15: qty 20

## 2019-08-15 MED ORDER — KETAMINE HCL 10 MG/ML IJ SOLN
INTRAMUSCULAR | Status: AC
  Filled 2019-08-15: qty 1

## 2019-08-15 MED ORDER — LIDOCAINE HCL 2 % IJ SOLN
INTRAMUSCULAR | Status: AC
  Filled 2019-08-15: qty 20

## 2019-08-15 MED ORDER — CARVEDILOL 25 MG PO TABS
25.0000 mg | ORAL_TABLET | Freq: Two times a day (BID) | ORAL | Status: DC
  Administered 2019-08-16 – 2019-08-17 (×2): 25 mg via ORAL
  Filled 2019-08-15 (×3): qty 1

## 2019-08-15 MED ORDER — OXYCODONE HCL 5 MG PO TABS: 5.0000 mg | ORAL_TABLET | Freq: Once | ORAL | Status: DC | PRN

## 2019-08-15 MED ORDER — BUPIVACAINE HCL 0.25 % IJ SOLN
INTRAMUSCULAR | Status: AC
  Filled 2019-08-15: qty 1

## 2019-08-15 MED ORDER — ONDANSETRON HCL 4 MG/2ML IJ SOLN: 4.0000 mg | Freq: Four times a day (QID) | INTRAMUSCULAR | Status: DC | PRN

## 2019-08-15 MED ORDER — HYDROMORPHONE HCL 1 MG/ML IJ SOLN: 0.2000 mg | INTRAMUSCULAR | Status: DC | PRN

## 2019-08-15 SURGICAL SUPPLY — 119 items
APPLICATOR SURGIFLO ENDO (HEMOSTASIS) IMPLANT
BACTOSHIELD CHG 4% 4OZ (MISCELLANEOUS) ×2
BAG LAPAROSCOPIC 12 15 PORT 16 (BASKET) IMPLANT
BAG RETRIEVAL 12/15 (BASKET)
BAG RETRIEVAL 12/15MM (BASKET)
BINDER ABDOMINAL 12 ML 46-62 (SOFTGOODS) IMPLANT
BLADE CLIPPER SURG (BLADE) IMPLANT
BLADE EXTENDED COATED 6.5IN (ELECTRODE) IMPLANT
BLADE SURG SZ10 CARB STEEL (BLADE) IMPLANT
CHLORAPREP W/TINT 26 (MISCELLANEOUS) ×4 IMPLANT
COVER BACK TABLE 60X90IN (DRAPES) ×4 IMPLANT
COVER TIP SHEARS 8 DVNC (MISCELLANEOUS) ×2 IMPLANT
COVER TIP SHEARS 8MM DA VINCI (MISCELLANEOUS) ×2
COVER WAND RF STERILE (DRAPES) IMPLANT
DECANTER SPIKE VIAL GLASS SM (MISCELLANEOUS) IMPLANT
DERMABOND ADVANCED (GAUZE/BANDAGES/DRESSINGS) ×2
DERMABOND ADVANCED .7 DNX12 (GAUZE/BANDAGES/DRESSINGS) ×2 IMPLANT
DEVICE TROCAR PUNCTURE CLOSURE (ENDOMECHANICALS) ×4 IMPLANT
DRAIN CHANNEL 19F RND (DRAIN) IMPLANT
DRAPE ARM DVNC X/XI (DISPOSABLE) ×8 IMPLANT
DRAPE COLUMN DVNC XI (DISPOSABLE) ×2 IMPLANT
DRAPE DA VINCI XI ARM (DISPOSABLE) ×8
DRAPE DA VINCI XI COLUMN (DISPOSABLE) ×2
DRAPE INCISE IOBAN 66X45 STRL (DRAPES) IMPLANT
DRAPE LAPAROSCOPIC ABDOMINAL (DRAPES) ×4 IMPLANT
DRAPE SHEET LG 3/4 BI-LAMINATE (DRAPES) ×4 IMPLANT
DRAPE SURG IRRIG POUCH 19X23 (DRAPES) ×4 IMPLANT
DRAPE WARM FLUID 44X44 (DRAPES) ×4 IMPLANT
DRSG OPSITE POSTOP 4X6 (GAUZE/BANDAGES/DRESSINGS) IMPLANT
DRSG OPSITE POSTOP 4X8 (GAUZE/BANDAGES/DRESSINGS) ×4 IMPLANT
ELECT PENCIL ROCKER SW 15FT (MISCELLANEOUS) ×4 IMPLANT
ELECT REM PT RETURN 15FT ADLT (MISCELLANEOUS) ×8 IMPLANT
EVACUATOR SILICONE 100CC (DRAIN) IMPLANT
GAUZE SPONGE 4X4 12PLY STRL (GAUZE/BANDAGES/DRESSINGS) ×4 IMPLANT
GLOVE BIO SURGEON STRL SZ 6 (GLOVE) ×16 IMPLANT
GLOVE BIO SURGEON STRL SZ 6.5 (GLOVE) ×6 IMPLANT
GLOVE BIO SURGEON STRL SZ7.5 (GLOVE) ×4 IMPLANT
GLOVE BIO SURGEONS STRL SZ 6.5 (GLOVE) ×2
GOWN STRL REUS W/ TWL LRG LVL3 (GOWN DISPOSABLE) ×8 IMPLANT
GOWN STRL REUS W/TWL LRG LVL3 (GOWN DISPOSABLE) ×20 IMPLANT
GOWN STRL REUS W/TWL XL LVL3 (GOWN DISPOSABLE) ×8 IMPLANT
GRASPER SUT TROCAR 14GX15 (MISCELLANEOUS) IMPLANT
HANDLE SUCTION POOLE (INSTRUMENTS) ×2 IMPLANT
HOLDER FOLEY CATH W/STRAP (MISCELLANEOUS) ×4 IMPLANT
IRRIG SUCT STRYKERFLOW 2 WTIP (MISCELLANEOUS) ×4
IRRIGATION SUCT STRKRFLW 2 WTP (MISCELLANEOUS) ×2 IMPLANT
KIT BASIN (CUSTOM PROCEDURE TRAY) ×4 IMPLANT
KIT PROCEDURE DA VINCI SI (MISCELLANEOUS)
KIT PROCEDURE DVNC SI (MISCELLANEOUS) IMPLANT
KIT TURNOVER KIT A (KITS) IMPLANT
MANIPULATOR UTERINE 4.5 ZUMI (MISCELLANEOUS) ×4 IMPLANT
MARKER SKIN DUAL TIP RULER LAB (MISCELLANEOUS) ×4 IMPLANT
MESH PROLENE PML 12X12 (Mesh General) ×4 IMPLANT
NEEDLE HYPO 21X1.5 SAFETY (NEEDLE) ×4 IMPLANT
NEEDLE HYPO 22GX1.5 SAFETY (NEEDLE) IMPLANT
NEEDLE SPNL 18GX3.5 QUINCKE PK (NEEDLE) IMPLANT
NS IRRIG 1000ML POUR BTL (IV SOLUTION) ×8 IMPLANT
OBTURATOR OPTICAL STANDARD 8MM (TROCAR) ×2
OBTURATOR OPTICAL STND 8 DVNC (TROCAR) ×2
OBTURATOR OPTICALSTD 8 DVNC (TROCAR) ×2 IMPLANT
PACK GENERAL/GYN (CUSTOM PROCEDURE TRAY) ×4 IMPLANT
PACK ROBOT GYN WLCUSTOM (TRAY / TRAY PROCEDURE) ×4 IMPLANT
PAD POSITIONING PINK XL (MISCELLANEOUS) ×4 IMPLANT
PENCIL SMOKE EVACUATOR (MISCELLANEOUS) IMPLANT
PORT ACCESS TROCAR AIRSEAL 12 (TROCAR) ×2 IMPLANT
PORT ACCESS TROCAR AIRSEAL 5M (TROCAR) ×2
POUCH SPECIMEN RETRIEVAL 10MM (ENDOMECHANICALS) ×4 IMPLANT
PROTECTOR NERVE ULNAR (MISCELLANEOUS) ×8 IMPLANT
RETAINER VISCERA MED (MISCELLANEOUS) ×4 IMPLANT
SCISSORS LAP 5X35 DISP (ENDOMECHANICALS) ×4 IMPLANT
SCRUB CHG 4% DYNA-HEX 4OZ (MISCELLANEOUS) ×2 IMPLANT
SEAL CANN UNIV 5-8 DVNC XI (MISCELLANEOUS) ×6 IMPLANT
SEAL XI 5MM-8MM UNIVERSAL (MISCELLANEOUS) ×6
SEALER VESSEL DA VINCI XI (MISCELLANEOUS) ×2
SEALER VESSEL EXT DVNC XI (MISCELLANEOUS) ×2 IMPLANT
SET TRI-LUMEN FLTR TB AIRSEAL (TUBING) ×4 IMPLANT
SLEEVE XCEL OPT CAN 5 100 (ENDOMECHANICALS) ×8 IMPLANT
SPONGE LAP 18X18 RF (DISPOSABLE) ×4 IMPLANT
STAPLER VISISTAT 35W (STAPLE) ×4 IMPLANT
SUCTION POOLE HANDLE (INSTRUMENTS) ×4
SURGIFLO W/THROMBIN 8M KIT (HEMOSTASIS) IMPLANT
SUT ETHILON 3 0 PS 1 (SUTURE) IMPLANT
SUT MNCRL AB 4-0 PS2 18 (SUTURE) ×8 IMPLANT
SUT NOVA NAB GS-21 0 18 T12 DT (SUTURE) ×8 IMPLANT
SUT PDS AB 1 CTX 36 (SUTURE) ×8 IMPLANT
SUT PDS AB 1 TP1 54 (SUTURE) ×4 IMPLANT
SUT PDS AB 1 TP1 96 (SUTURE) ×16 IMPLANT
SUT PDS AB 2-0 CT2 27 (SUTURE) IMPLANT
SUT SILK 2 0 (SUTURE) ×2
SUT SILK 2 0 SH CR/8 (SUTURE) ×4 IMPLANT
SUT SILK 2-0 30XBRD TIE 12 (SUTURE) ×2 IMPLANT
SUT SILK 3 0 (SUTURE)
SUT SILK 3 0 12 30 (SUTURE) ×4 IMPLANT
SUT SILK 3 0 SH CR/8 (SUTURE) ×4 IMPLANT
SUT SILK 3-0 18XBRD TIE 12 (SUTURE) IMPLANT
SUT VIC AB 0 CT1 27 (SUTURE) ×4
SUT VIC AB 0 CT1 27XBRD ANTBC (SUTURE) ×4 IMPLANT
SUT VIC AB 1 CT1 27 (SUTURE) ×2
SUT VIC AB 1 CT1 27XBRD ANBCTR (SUTURE) ×2 IMPLANT
SUT VIC AB 2-0 CT1 27 (SUTURE)
SUT VIC AB 2-0 CT1 27XBRD (SUTURE) IMPLANT
SUT VIC AB 2-0 CT1 TAPERPNT 27 (SUTURE) IMPLANT
SUT VIC AB 3-0 SH 18 (SUTURE) ×4 IMPLANT
SUT VIC AB 3-0 SH 27 (SUTURE) ×2
SUT VIC AB 3-0 SH 27X BRD (SUTURE) ×2 IMPLANT
SUT VIC AB 3-0 SH 27XBRD (SUTURE) IMPLANT
SUT VIC AB 4-0 PS2 18 (SUTURE) ×8 IMPLANT
SUT VICRYL 4-0 PS2 18IN ABS (SUTURE) ×8 IMPLANT
SYR 10ML LL (SYRINGE) IMPLANT
SYR 20ML LL LF (SYRINGE) ×4 IMPLANT
TOWEL OR 17X26 10 PK STRL BLUE (TOWEL DISPOSABLE) ×8 IMPLANT
TOWEL OR NON WOVEN STRL DISP B (DISPOSABLE) ×4 IMPLANT
TRAP SPECIMEN MUCUS 40CC (MISCELLANEOUS) IMPLANT
TRAY FOLEY MTR SLVR 16FR STAT (SET/KITS/TRAYS/PACK) ×4 IMPLANT
TROCAR XCEL NON-BLD 5MMX100MML (ENDOMECHANICALS) IMPLANT
UNDERPAD 30X36 HEAVY ABSORB (UNDERPADS AND DIAPERS) ×4 IMPLANT
WATER STERILE IRR 1000ML POUR (IV SOLUTION) ×4 IMPLANT
YANKAUER SUCT BULB TIP 10FT TU (MISCELLANEOUS) ×4 IMPLANT
YANKAUER SUCT BULB TIP NO VENT (SUCTIONS) IMPLANT

## 2019-08-15 NOTE — Op Note (Signed)
OPERATIVE NOTE  Pre-operative Diagnosis: Stage IV gyn malignancy s/p 6 cycles of NACT (excellent response, delayed IDS until after 6 cycles for coordination of surgery), abdominal wall hernia  Post-operative Diagnosis: same, ventral hernia at site of prior ostomy  Operation: Robotic-assisted laparoscopic total hysterectomy with bilateral salpingoophorectomy, infracolic omentectomy Lysis of adhesions and hernia reduction (Dr. Rosendo Gros), repair of abdominal wall hernia (Dr. Rosendo Gros)  Surgeon: Jeral Pinch MD  Assistant Surgeon: Lahoma Crocker MD (an MD assistant was necessary for tissue manipulation, management of robotic instrumentation, retraction and positioning due to the complexity of the case and hospital policies).   Anesthesia: GET  Urine Output: see I&O flowsheet  Operative Findings:  On EUA, small mobile uterus. On intra-abdominal entry, normal appearing liver edge, diaphragm, and stomach. Omentum adherent to the anterior abdominal wall along patient's prior midline incision. Transverse colon hernia through prior ostomy site. Otherwise, some adhesions of the sigmoid to the left sidewall. Uterus 6cm and normal appearing. Right adnexa normal in appearance. Left adnexa with adhesions to the sigmoid colon and adherent to the medial aspect of the broad ligament. Left ovary with evidence of treatment effect. Several small (<77mm) nodules on the peritoneum within the cul de sac c/w treated tumor implants (cauterized). Omentum without obvious evidence of tumor burden.   Estimated Blood Loss:  less than 100 mL  For my portion of the surgery    Total IV Fluids: see I&O flowsheet         Specimens: uterus, cervix, bilateral tubes and ovaries, omentum         Complications:  None apparent; patient tolerated the procedure well.         Disposition: PACU - hemodynamically stable.  Procedure Details  The patient was seen in the Holding Room. The risks, benefits, complications, treatment  options, and expected outcomes were discussed with the patient.  The patient concurred with the proposed plan, giving informed consent.  The site of surgery properly noted/marked. The patient was identified as Megan Waller and the procedure verified as a Robotic-assisted hysterectomy with bilateral salpingo oophorectomy, omentectomy.   After induction of anesthesia, the patient was draped and prepped in the usual sterile manner. Patient was placed in supine position after anesthesia and draped and prepped in the usual sterile manner as follows: Her arms were tucked to her side with all appropriate precautions.  The shoulders were stabilized with padded shoulder blocks applied to the acromium processes.  The patient was placed in the semi-lithotomy position in [place].  The perineum and vagina were prepped with CholoraPrep. The patient was draped after the CholoraPrep had been allowed to dry for 3 minutes.  A Time Out was held and the above information confirmed.  The urethra was prepped with Betadine. Foley catheter was placed.  A sterile speculum was placed in the vagina.  The cervix was grasped with a single-tooth tenaculum. The cervix was dilated with Kennon Rounds dilators.  The ZUMI uterine manipulator with a medium colpotomizer ring was placed without difficulty.  A pneum occluder balloon was placed over the manipulator.  OG tube placement was confirmed and to suction.   Next, a 10 mm skin incision was made 1 cm below the subcostal margin in the midclavicular line.  The 5 mm Optiview port and scope was used for direct entry.  Opening pressure was under 10 mm CO2.  The abdomen was insufflated and the findings were noted as above.   At this point and all points during the procedure, the patient's  intra-abdominal pressure did not exceed 15 mmHg.   The procedure was turned over to Dr. Rosendo Gros, with placement of two 5 mm laparoscopic ports on the patient's right to perform lysis of adhesions, freeing the  omentum and reducing the transverse colon hernia.   Next, an 8 mm skin incision was made superior to the umbilicus and a right and left port were placed about 8 cm lateral to the robot port on the right and left side.  A fourth arm was placed on the right.  The 5 mm assist trocar was exchanged for a 10-12 mm port. All ports were placed under direct visualization.  The patient was placed in steep Trendelenburg.  The robot was docked in the normal manner.  The right and left peritoneum were opened parallel to the IP ligament to open the retroperitoneal spaces bilaterally. The round ligaments were transected. The ureter was noted to be on the medial leaf of the broad ligament.  The peritoneum above the ureter was incised and stretched and the infundibulopelvic ligament was skeletonized, cauterized and cut.    The posterior peritoneum was taken down to the level of the KOH ring.  The anterior peritoneum was also taken down.  The bladder flap was created to the level of the KOH ring.  The uterine artery on the right side was skeletonized, cauterized and cut in the normal manner.  A similar procedure was performed on the left.  The colpotomy was made and the uterus, cervix, bilateral ovaries and tubes were amputated and delivered through the vagina.  Pedicles were inspected and excellent hemostasis was achieved.    The colpotomy at the vaginal cuff was closed with Vicryl on a CT1 needle in a running manner.  Irrigation was used and excellent hemostasis was achieved.    Attention was then turned to the omentectomy. The omentum was elevated an a combination of monopolar electrocautery, the vessel sealer, and blunt and sharp dissection was used to dissect the omentum off of the transverse colon and several adherent loops of small bowel. For delivery of the specimen, the decision was made to complete the omentectomy after conversion to laparotomy for Dr. Ramirez's hernia repair. Robotic instruments were removed  under direct visulaization.  The robot was undocked. After the laparotomy had been made, the omentum and transverse colon were delivered through the midline incision. Remaining vasculature pedicle attachments were clamped, transected and suture ligated, freeing the omentum.  The vagina was swabbed with  minimal bleeding noted.     The procedure was then turned over to Dr. Rosendo Gros.  Jeral Pinch, MD

## 2019-08-15 NOTE — Anesthesia Postprocedure Evaluation (Signed)
Anesthesia Post Note  Patient: Megan Waller  Procedure(s) Performed: XI ROBOTIC ASSISTED TOTAL HYSTERECTOMY BILATERAL SALPINGO OOPHORECTOMY WITH OMENTECTOMY AND DEBULKING. (Bilateral ) EXPLORATORY LAPAROTOMY (N/A ) LYSIS OF ADHESION (N/A ) HERNIA REPAIR INCISIONAL WITH MESH (N/A )     Patient location during evaluation: PACU Anesthesia Type: General Level of consciousness: awake and alert and oriented Pain management: pain level controlled Vital Signs Assessment: post-procedure vital signs reviewed and stable Respiratory status: spontaneous breathing, nonlabored ventilation, respiratory function stable and patient connected to nasal cannula oxygen Cardiovascular status: blood pressure returned to baseline and stable Postop Assessment: no apparent nausea or vomiting Anesthetic complications: no   No complications documented.  Last Vitals:  Vitals:   08/15/19 1600 08/15/19 1615  BP: 129/66 (!) 141/75  Pulse: (!) 55 (!) 59  Resp: 14 14  Temp:    SpO2: 99% 99%    Last Pain:  Vitals:   08/15/19 1615  TempSrc:   PainSc: 7                  Erie Sica A.

## 2019-08-15 NOTE — Transfer of Care (Signed)
Immediate Anesthesia Transfer of Care Note  Patient: Megan Waller  Procedure(s) Performed: XI ROBOTIC ASSISTED TOTAL HYSTERECTOMY BILATERAL SALPINGO OOPHORECTOMY WITH OMENTECTOMY AND DEBULKING. (Bilateral ) EXPLORATORY LAPAROTOMY (N/A ) LYSIS OF ADHESION (N/A ) HERNIA REPAIR INCISIONAL WITH MESH (N/A )  Patient Location: PACU  Anesthesia Type:General  Level of Consciousness: sedated  Airway & Oxygen Therapy: Patient Spontanous Breathing and Patient connected to face mask oxygen  Post-op Assessment: Report given to RN and Post -op Vital signs reviewed and stable  Post vital signs: Reviewed and stable  Last Vitals:  Vitals Value Taken Time  BP 132/62 08/15/19 1522  Temp    Pulse 54 08/15/19 1525  Resp 15 08/15/19 1525  SpO2 100 % 08/15/19 1525  Vitals shown include unvalidated device data.  Last Pain:  Vitals:   08/15/19 0843  TempSrc:   PainSc: 0-No pain      Patients Stated Pain Goal: 4 (74/82/70 7867)  Complications: No complications documented.

## 2019-08-15 NOTE — Anesthesia Preprocedure Evaluation (Addendum)
Anesthesia Evaluation  Patient identified by MRN, date of birth, ID band Patient awake    Reviewed: Allergy & Precautions, NPO status , Patient's Chart, lab work & pertinent test results  History of Anesthesia Complications Negative for: history of anesthetic complications  Airway Mallampati: II  TM Distance: >3 FB Neck ROM: Full    Dental  (+) Edentulous Upper, Partial Lower, Dental Advisory Given   Pulmonary COPD, former smoker,  08/11/2019 SARS coronavirus NEG   breath sounds clear to auscultation       Cardiovascular hypertension, Pt. on medications and Pt. on home beta blockers (-) angina+ dysrhythmias (currently NSR, not on anticoag) Atrial Fibrillation  Rhythm:Regular Rate:Normal  02/2019 ECHO: EF 60-65%, valves OK  2017 via Care Everywhere: No reversible ischemia or infarction.  No left ventricular wall motion.  Left ventricle ejection fraction 82%.  Normal study.   Neuro/Psych Anxiety Glaucoma parkinson's    GI/Hepatic negative GI ROS, Neg liver ROS,   Endo/Other  negative endocrine ROS  Renal/GU negative Renal ROS   H/o ovarian cancer    Musculoskeletal   Abdominal   Peds  Hematology negative hematology ROS (+)   Anesthesia Other Findings   Reproductive/Obstetrics                            Anesthesia Physical Anesthesia Plan  ASA: III  Anesthesia Plan: General   Post-op Pain Management:    Induction: Intravenous  PONV Risk Score and Plan: 3 and Dexamethasone, Ondansetron and Treatment may vary due to age or medical condition  Airway Management Planned: Oral ETT  Additional Equipment:   Intra-op Plan:   Post-operative Plan:   Informed Consent: I have reviewed the patients History and Physical, chart, labs and discussed the procedure including the risks, benefits and alternatives for the proposed anesthesia with the patient or authorized representative who has  indicated his/her understanding and acceptance.     Dental advisory given  Plan Discussed with: CRNA and Surgeon  Anesthesia Plan Comments:        Anesthesia Quick Evaluation

## 2019-08-15 NOTE — H&P (Signed)
GYNECOLOGIC ONCOLOGY H&P  History of Present Illness:  Megan Waller is a 79 y.o. y.o. female who presents for IDS and hernia repair after 6 cycles of NACT for metastatic GYN malignancy.  PAST MEDICAL HISTORY:  Past Medical History:  Diagnosis Date  . Abdominal hernia   . Acute on chronic respiratory failure with hypoxia ([place]) 03/25/2019  . Anxiety   . Anxiety   . Atrial fibrillation ([place]) 03/24/2019  . Chest pressure 09/18/2015  . Community acquired bilateral lower lobe pneumonia 03/24/2019  . Complication of anesthesia   . COPD (chronic obstructive pulmonary disease) ([place]) 03/27/2019  . Diverticulitis   . Essential hypertension 09/18/2015  . Family history of breast cancer   . Family history of colon cancer   . Family history of melanoma   . Family history of prostate cancer   . GAD (generalized anxiety disorder) 12/01/2015  . Glaucoma   . Hypercholesteremia   . Hypertension   . Hyperthyroidism   . Hypokalemia 09/18/2015  . Ovarian cancer ([place])   . Parkinson disease ([place]) 12/01/2015  . Primary insomnia 12/01/2015  . Skin cancer    squamous cell  . Weakness 09/18/2015     PAST SURGICAL HISTORY:  Past Surgical History:  Procedure Laterality Date  . BREAST LUMPECTOMY    . PARTIAL COLECTOMY     had partial colectomy for diverticular disease, required an ostomy followed by stoma revision  . right hand surgery    . squamous cell skin cancer resections     3    OB/GYN HISTORY:  OB History  Gravida Para Term Preterm AB Living  3 3          SAB TAB Ectopic Multiple Live Births               # Outcome Date GA Lbr Len/2nd Weight Sex Delivery Anes PTL Lv  3 Para           2 Para           1 Para             No LMP recorded. Patient is postmenopausal.  MEDICATIONS: No current facility-administered medications on file prior to encounter.   Current Outpatient Medications on File Prior to Encounter  Medication Sig Dispense Refill  . aspirin 81 MG EC tablet Take by  mouth.    . carvedilol (COREG) 25 MG tablet Take 25 mg by mouth 2 (two) times daily.    Marland Kitchen diltiazem (CARDIZEM CD) 120 MG 24 hr capsule Take 120 mg by mouth daily.    . DULoxetine (CYMBALTA) 20 MG capsule Take 40 mg by mouth daily.    . furosemide (LASIX) 40 MG tablet Take 40 mg by mouth daily.    . methimazole (TAPAZOLE) 5 MG tablet Take 5 mg by mouth daily.     . rosuvastatin (CRESTOR) 10 MG tablet Take 10 mg by mouth every Monday.     . Travoprost, BAK Free, (TRAVATAN) 0.004 % SOLN ophthalmic solution Place 1 drop into both eyes at bedtime.       ALLERGIES:  Allergies  Allergen Reactions  . Ezetimibe-Simvastatin Other (See Comments)    Aching     FAMILY HISTORY:  Family History  Problem Relation Age of Onset  . Stroke Mother   . Heart attack Father   . Skin cancer Father        non-melanoma; dx. in his late 53s  . Breast cancer Sister 31  . Prostate cancer  Brother 40       metastatic  . Colon cancer Brother 67  . Melanoma Brother   . Breast cancer Sister 72  . Breast cancer Niece 7  . Colon cancer Paternal Aunt        dx. in her 80s or 41s  . Cancer Paternal Uncle        mole on back?, dx. in his 15s  . Breast cancer Other 48       paternal great-aunt (grandmother's sister)  . Melanoma Brother 35  . Colon cancer Paternal Aunt        dx. older than 61  . Throat cancer Paternal Aunt        dx. in her 31s  . Breast cancer Cousin        dx. in her 87s or 46s (maternal first cousin)  . Breast cancer Cousin        dx. in her 2s or 10s (maternal first cousin)  . Breast cancer Cousin        dx. in her 72s (maternal first cousin)     SOCIAL HISTORY:    Social Connections:   . Frequency of Communication with Friends and Family:   . Frequency of Social Gatherings with Friends and Family:   . Attends Religious Services:   . Active Member of Clubs or Organizations:   . Attends Archivist Meetings:   Marland Kitchen Marital Status:     REVIEW OF SYSTEMS:  Pertinent  positives as per HPI Denies appetite changes, fevers, chills, unexplained weight changes. Denies hearing loss, neck lumps or masses, mouth sores, ringing in ears or voice changes. Denies cough or wheezing.  Denies shortness of breath. Denies chest pain or palpitations. Denies leg swelling. Denies abdominal distention, pain, blood in stools, diarrhea, nausea, vomiting, or early satiety. Denies pain with intercourse, dysuria, frequency, hematuria or incontinence. Denies hot flashes, pelvic pain, vaginal bleedingor vaginal discharge.  Denies joint pain, back pain or muscle pain/cramps. Denies itching, rash, or wounds. Denies dizziness, headaches, numbness or seizures. Denies swollen lymph nodes or glands, denies easy bruising or bleeding. Denies anxiety, depression, confusion, or decreased concentration.  Physical Exam:  Vital Signs for this encounter:  Blood pressure (!) 186/71, pulse 65, temperature 98.4 F (36.9 C), temperature source Oral, resp. rate 16, height 5\' 2"  (1.575 m), weight 142 lb (64.4 kg), SpO2 95 %. Body mass index is 25.97 kg/m. General: Alert, oriented, no acute distress.  HEENT: Normocephalic, atraumatic. Sclera anicteric.  Exam 05/2019 Chest: Mildly decreased breath sounds at lung bases, otherwise clear to auscultation bilaterally. No wheezes, rhonchi, or rales. Cardiovascular: Regular rate and rhythm, no murmurs, rubs, or gallops.  Abdomen: Normoactive bowel sounds. Soft, nondistended, nontender to palpation.  Hernia appreciated in left mid to lower abdomen measuring approximately 8 cm.  Well-healed midline laparotomy scar as well as prior ostomy site.  No masses appreciated.  No palpable fluid wave.  Extremities: Grossly normal range of motion. Warm, well perfused. No edema bilaterally.  Skin: No rashes or lesions.  Lymphatics: No cervical, supraclavicular, or inguinal adenopathy.  GU:      Normal external female genitalia.No lesions. No discharge or  bleeding. Bladder/urethra: No lesions or masses, well supported bladder Vagina: Moderately atrophic vaginal mucosa.  No lesions or masses. Cervix: Normal appearing, no lesions. Uterus: Small, mobile, no parametrial involvement or nodularity. Adnexa: No masses appreciated.             Rectal: No nodularity.   LABORATORY AND RADIOLOGIC  DATA:  Lab Results  Component Value Date   WBC 6.3 08/11/2019   HGB 11.4 (L) 08/11/2019   HCT 36.1 08/11/2019   PLT 136 (L) 08/11/2019   GLUCOSE 141 (H) 08/11/2019   ALT 12 08/11/2019   AST 14 (L) 08/11/2019   NA 144 08/11/2019   K 3.4 (L) 08/11/2019   CL 103 08/11/2019   CREATININE 0.68 08/11/2019   BUN 13 08/11/2019   CO2 29 08/11/2019    ASSESSMENT/PLAN: 79 year old with stage IVB gynecologic malignancy (presumed ovarian) who has had an excellent radiographic and biochemical response after 6 cycles of neoadjuvant chemotherapy.  Plan for IDS and hernia repair, joint case with Dr. Rosendo Gros.  Jeral Pinch MD Gynecologic Oncology

## 2019-08-15 NOTE — Anesthesia Procedure Notes (Signed)
Procedure Name: Intubation Date/Time: 08/15/2019 9:58 AM Performed by: Talbot Grumbling, CRNA Pre-anesthesia Checklist: Patient identified, Emergency Drugs available, Suction available and Patient being monitored Patient Re-evaluated:Patient Re-evaluated prior to induction Oxygen Delivery Method: Circle system utilized Preoxygenation: Pre-oxygenation with 100% oxygen Induction Type: IV induction Ventilation: Mask ventilation without difficulty and Oral airway inserted - appropriate to patient size Laryngoscope Size: Mac and 3 Grade View: Grade I Tube type: Oral Tube size: 7.5 mm Number of attempts: 1 Airway Equipment and Method: Stylet Placement Confirmation: ETT inserted through vocal cords under direct vision,  positive ETCO2 and breath sounds checked- equal and bilateral Secured at: 21 cm Tube secured with: Tape Dental Injury: Teeth and Oropharynx as per pre-operative assessment  Comments: Intubated by Luna Kitchens, Paramedic student

## 2019-08-15 NOTE — Op Note (Signed)
08/15/2019  2:28 PM  PATIENT:  Megan Waller  79 y.o. female  PRE-OPERATIVE DIAGNOSIS:  GYNECOLOGIC MALIGNANCY  POST-OPERATIVE DIAGNOSIS:  GYNECOLOGIC MALIGNANCY  PROCEDURE:  EXPLORATORY LAPAROTOMY (N/A) - REQUESTING RNFA LYSIS OF ADHESION (N/A) HERNIA REPAIR INCISIONAL WITH MESH with bilateral musculocutaneous flap advancements.  (N/A)  SURGEON:  Surgeon(s) and Role:    * [name], MD - Primary  ANESTHESIA:   local and general  EBL:  175 mL   BLOOD ADMINISTERED:none  DRAINS: none   LOCAL MEDICATIONS USED:  OTHER Exparil  SPECIMEN:  No Specimen  DISPOSITION OF SPECIMEN:  N/A  COUNTS:  YES  TOURNIQUET:  * No tourniquets in log *  DICTATION: .Dragon Dictation After the patient was consented he was taken back to the operating room and placed in the supine position with bilateral SCDs in place. The patient was prepped and draped in usual sterile fashion. Antibiotics were confirmed and timeout was called and all facts verified.  Dr. Berline Lopes will dictate her part of the operation under separate cover.  2 5 mm port sites were then placed in the right upper quadrant and right lower quadrant area.  The midline scar tissue was fairly thin and consisted of omentum.  There was some colon within the left lower quadrant hernia site.  All adhesions were taken down with both blunt and sharp dissection.  Care was taken to not cauterize any of the scar tissue near the bowel.  Once this was freed up Dr. Berline Lopes proceeded with her portion of the case.  After portion of the procedure was completed we proceeded with a laparotomy.  At this time I proceeded to excise the skin scar. This was discarded. I proceeded to use electrocautery to maintain hemostasis and dissection took place in the most superior portion of the wound down to the subcutaneous tissues fat to the anterior fascia. This was incised. The fascia was elevated and 2 Kocher clamps.  The hernia sac and the abdominal  cavity were entered bluntly.   At this time I proceeded to retract is rectus muscles medially.  At this time the posterior fascia of the rectus muscle was then incised.  Using blunt dissection the belly was dissected away from the posterior rectus fascia.  This was done inferiorly and superiorly.  Working towards the left lower quadrant the ostomy site hernia was easily seen.  This was incised.  This left a defect of approximately 4 x 4 cm.  This was reapproximated using a #1 Vicryl in a running fashion x1.  At the midline inferior and superior portions of the linea alba this was taken down from the anterior abdominal wall.  This created a left retrorectus musculocutaneous flap.   At this time the posterior fascia of the right rectus muscle was then incised.  Using blunt dissection the belly was dissected away from the posterior rectus fascia.  This was done inferiorly and superiorly.  Superiorly the preperitoneal fat just superior to the falciform ligament was incised.  This also created a right-sided musculocutaneous flap.   The musculocutaneous flaps were advanced medially.  This allowed the posterior fascia to be reapproximated appropriately without undue tension. The posterior fascia was reapproximated using #1 PDS in a standard running fashion x2.  I proceeded to irrigate out the retrorectus space.  The area was measured and seemed to be approximately 15 x 25 cmin size.  A piece of Ethibond Prolene 25 x 25 cm was selected, cut to shape, and placed into the  retrorectus space.  0 Novafil's were used in interrupted fashion bilaterally x2, superiorly x2 and down to Cooper's ligament x2.  This fit well and flat.  Vistaseal glue was then used to fasten the mesh to the midline fascia.  At this time the anterior fascia and midline were reapproximated using #1 PDS in a standard running fashion x2.  The subcutaneous tissue was irrigated out with sterile saline.  The skin was then reapproximated using 3-0  Monocryl in subcuticular fashion, and Dermabond.  The midline wound was then dressed with honeycomb dressing.  The patient tolerated procedure well was taken to the recovery room in stable condition.   PLAN OF CARE: Admit to inpatient   PATIENT DISPOSITION:  PACU - hemodynamically stable.   Delay start of Pharmacological VTE agent (>24hrs) due to surgical blood loss or risk of bleeding: not applicable

## 2019-08-15 NOTE — H&P (Signed)
History of Present Illness  The patient is a 79 year old female who presents with a complaint of incisional hernia. Patient is a 79 year old female with a history of stage IV ovarian cancer, hypertension, A. fib, COPD, who comes in for preoperative consult. Patient has seen Dr. Berline Lopes to evaluate for for hysterectomy, bilateral salpingo-oophorectomy, omentectomy and likely debulking surgery. Patient's had a past surgical history significant for sigmoid diverticulitis with perforation, left colostomy, that was reversed laparoscopically. This is done and [facility] by Dr. Corena Pilgrim. It appears in patient had significant scar tissue at the reversal time. This is approximately 8 years ago.  Patient at this time states she's had some pain to left side. She also has some constipation recently with chemotherapy.  Patient underwent CT scan. This does appear to show a left paramedian incisional hernia at the old colostomy site. There is associated colonic and small bowel within the hernia. This appears to be reducible on exam.   Past Surgical History  Resection of Small Bowel   Diagnostic Studies History Colonoscopy  >10 years ago Mammogram  >3 years ago Pap Smear  >5 years ago  Allergies No Known Drug Allergies  [06/14/2019]: Allergies Reconciled   Medication History  Carvedilol (25MG  Tablet, Oral) Active. dilTIAZem HCl ER Coated Beads (120MG  Capsule ER 24HR, Oral) Active. Rosuvastatin Calcium (10MG  Tablet, Oral) Active. methIMAzole (5MG  Tablet, Oral) Active. Potassium Chloride (20 MEQ/15ML(10%) Solution, Oral) Active. Travoprost (BAK Free) (0.004% Solution, Ophthalmic) Active. Medications Reconciled  Social History  Caffeine use  Carbonated beverages, Tea. No alcohol use  No drug use  Tobacco use  Former smoker.  Family History  Breast Cancer  Sister. Cancer  Brother. Cerebrovascular Accident  Mother. Colon Cancer  Brother. Diabetes Mellitus   Mother. Heart Disease  Father. Hypertension  Mother. Melanoma  Brother.  Pregnancy / Birth History Age at menarche  85 years. Gravida  4 Maternal age  38-20 Para  3  Other Problems  Anxiety Disorder  Back Pain  Diverticulosis  High blood pressure  Hypercholesterolemia  Ovarian Cancer  Thyroid Disease     Review of Systems Skin Not Present- Change in Wart/Mole, Dryness, Hives, Jaundice, New Lesions, Non-Healing Wounds, Rash and Ulcer. HEENT Present- Hearing Loss, Seasonal Allergies and Wears glasses/contact lenses. Not Present- Earache, Hoarseness, Nose Bleed, Oral Ulcers, Ringing in the Ears, Sinus Pain, Sore Throat, Visual Disturbances and Yellow Eyes. Respiratory Not Present- Bloody sputum, Chronic Cough, Difficulty Breathing, Snoring and Wheezing. Breast Not Present- Breast Mass, Breast Pain, Nipple Discharge and Skin Changes. Cardiovascular Not Present- Chest Pain, Difficulty Breathing Lying Down, Leg Cramps, Palpitations, Rapid Heart Rate, Shortness of Breath and Swelling of Extremities. Gastrointestinal Present- Constipation. Not Present- Abdominal Pain, Bloating, Bloody Stool, Change in Bowel Habits, Chronic diarrhea, Difficulty Swallowing, Excessive gas, Gets full quickly at meals, Hemorrhoids, Indigestion, Nausea, Rectal Pain and Vomiting. Female Genitourinary Not Present- Frequency, Nocturia, Painful Urination, Pelvic Pain and Urgency. Musculoskeletal Present- Back Pain, Joint Pain, Joint Stiffness and Muscle Pain. Not Present- Muscle Weakness and Swelling of Extremities. Neurological Present- Tingling and Weakness. Not Present- Decreased Memory, Fainting, Headaches, Numbness, Seizures, Tremor and Trouble walking. Psychiatric Present- Anxiety. Not Present- Bipolar, Change in Sleep Pattern, Depression, Fearful and Frequent crying. Endocrine Present- Hair Changes and Heat Intolerance. Not Present- Cold Intolerance, Excessive Hunger, Hot flashes and New  Diabetes.  BP (!) 186/71 (BP Location: Right Arm)   Pulse 65   Temp 98.4 F (36.9 C) (Oral)   Resp 16   Ht 5\' 2"  (1.575 m)  Wt 64.4 kg   SpO2 95%   BMI 25.97 kg/m     Physical Exam  The physical exam findings are as follows: Note: Constitutional: No acute distress, conversant, appears stated age  Eyes: Anicteric sclerae, moist conjunctiva, no lid lag  Neck: No thyromegaly, trachea midline, no cervical lymphadenopathy  Lungs: Clear to auscultation biilaterally, normal respiratory effot  Cardiovascular: regular rate & rhythm, no murmurs, no peripheal edema, pedal pulses 2+  GI: Soft, no masses or hepatosplenomegaly, non-tender to palpation  MSK: Normal gait, no clubbing cyanosis, edema  Skin: No rashes, palpation reveals normal skin turgor  Psychiatric: Appropriate judgment and insight, oriented to person, place, and time  Abdomen Inspection Hernias - Incisional - Reducible. Note: Left paramedian incision at the old colostomy site. Patient is a well-healed midline incision. There appears to be no midline incisions.    Assessment & Plan  INCISIONAL HERNIA, WITHOUT OBSTRUCTION OR GANGRENE (K43.2) Impression: Patient is a 79 year old female with an incisional hernia, history of stage IV ovarian cancer currently on chemotherapy. Patient is being scheduled for exploratory laparoscopy, lysis of adhesions and tumor debulking/bilateral oophorectomy and abdominal hysterectomy. Patient has been cleared by her cardiologist proceed with surgery. I agree that patient will likely benefit from exploratory laparotomy, lysis of adhesions and hernia repair concurrently. I did discuss with the patient and her daughter that this potentially could require mesh versus primary repair. I discussed with him the risks and benefits of the procedure to include but not limited to: Infection, bleeding, damage to structures, possible recurrence. Patient was understanding and wished to proceed.  I  reviewed the patient's external notes from the referring physicians as well as consulting physician team. Each of the radiologic studies and lab studies were independently reviewed and interpreted. I discussed the results of the above studies and how they relate to the patient's surgical problems.

## 2019-08-16 ENCOUNTER — Encounter (HOSPITAL_COMMUNITY): Payer: Self-pay | Admitting: Gynecologic Oncology

## 2019-08-16 LAB — CBC
HCT: 29.8 % — ABNORMAL LOW (ref 36.0–46.0)
Hemoglobin: 9.9 g/dL — ABNORMAL LOW (ref 12.0–15.0)
MCH: 30.7 pg (ref 26.0–34.0)
MCHC: 33.2 g/dL (ref 30.0–36.0)
MCV: 92.3 fL (ref 80.0–100.0)
Platelets: 140 10*3/uL — ABNORMAL LOW (ref 150–400)
RBC: 3.23 MIL/uL — ABNORMAL LOW (ref 3.87–5.11)
RDW: 13.9 % (ref 11.5–15.5)
WBC: 15 10*3/uL — ABNORMAL HIGH (ref 4.0–10.5)
nRBC: 0 % (ref 0.0–0.2)

## 2019-08-16 LAB — BASIC METABOLIC PANEL
Anion gap: 10 (ref 5–15)
BUN: 15 mg/dL (ref 8–23)
CO2: 26 mmol/L (ref 22–32)
Calcium: 8.2 mg/dL — ABNORMAL LOW (ref 8.9–10.3)
Chloride: 103 mmol/L (ref 98–111)
Creatinine, Ser: 0.72 mg/dL (ref 0.44–1.00)
GFR calc Af Amer: >60 mL/min (ref >60)
GFR calc non Af Amer: >60 mL/min (ref >60)
Glucose, Bld: 173 mg/dL — ABNORMAL HIGH (ref 70–99)
Potassium: 3.8 mmol/L (ref 3.5–5.1)
Sodium: 139 mmol/L (ref 135–145)

## 2019-08-16 NOTE — Progress Notes (Signed)
1 Day Post-Op Procedure(s) (LRB): XI ROBOTIC ASSISTED TOTAL HYSTERECTOMY BILATERAL SALPINGO OOPHORECTOMY WITH OMENTECTOMY AND DEBULKING. (Bilateral) EXPLORATORY LAPAROTOMY (N/A) LYSIS OF ADHESION (N/A) HERNIA REPAIR INCISIONAL WITH MESH (N/A)  Subjective: Patient reports pain well controlled overnight with Tylenol though has increased pain when she gets up.  Had difficulty emptying her bladder last night and had one in and out catheterization.  She has emptied her bladder multiple times since then without difficulty.  She denies any flatus.  Has tolerated some liquids without nausea or emesis.  Has walked only to the bathroom.  Objective: Vital signs in last 24 hours: Temp:  [97.4 F (36.3 C)-98.6 F (37 C)] 98.6 F (37 C) (06/23 1000) Pulse Rate:  [54-70] 70 (06/23 1000) Resp:  [9-20] 18 (06/23 1000) BP: (123-156)/(55-80) 138/64 (06/23 1000) SpO2:  [98 %-100 %] 100 % (06/23 1000) FiO2 (%):  [3 %] 3 % (06/22 1945) Last BM Date: 08/14/19  Intake/Output from previous day: 06/22 0701 - 06/23 0700 In: 3697.9 [P.O.:50; I.V.:3197.9; IV Piggyback:450] Out: 1075 [Urine:900; Blood:175]  Physical Examination: General: Alert, oriented, no acute distress. HEENT: Normocephalic, atraumatic, sclera anicteric. Chest: Clear to auscultation bilaterally.  No wheezes or rhonchi.  On 4 L of oxygen by nasal cannula when I entered the room.  I took this off and after speaking with the patient for 15 minutes, her oxygen saturation was 94% on room air. Cardiovascular: Regular rate and rhythm, no murmurs. Abdomen: Obese, soft, mildly tender to palpation around the incision.  Incision with honeycomb dressing that is bloodstained but otherwise clean, dry and intact.  Hypoactive bowel sounds.  Abdominal binder in place. Extremities: Grossly normal range of motion.  Warm, well perfused.  No edema bilaterally.  Labs: CBC    Component Value Date/Time   WBC 15.0 (H) 08/16/2019 0527   RBC 3.23 (L) 08/16/2019  0527   HGB 9.9 (L) 08/16/2019 0527   HCT 29.8 (L) 08/16/2019 0527   PLT 140 (L) 08/16/2019 0527   MCV 92.3 08/16/2019 0527   MCH 30.7 08/16/2019 0527   MCHC 33.2 08/16/2019 0527   RDW 13.9 08/16/2019 0527   LYMPHSABS 2.3 08/11/2019 1343   MONOABS 0.5 08/11/2019 1343   EOSABS 0.5 08/11/2019 1343   BASOSABS 0.1 08/11/2019 1343   CMP Latest Ref Rng & Units 08/16/2019 08/11/2019  Glucose 70 - 99 mg/dL 173(H) 141(H)  BUN 8 - 23 mg/dL 15 13  Creatinine 0.44 - 1.00 mg/dL 0.72 0.68  Sodium 135 - 145 mmol/L 139 144  Potassium 3.5 - 5.1 mmol/L 3.8 3.4(L)  Chloride 98 - 111 mmol/L 103 103  CO2 22 - 32 mmol/L 26 29  Calcium 8.9 - 10.3 mg/dL 8.2(L) 8.9  Total Protein 6.5 - 8.1 g/dL - 6.8  Total Bilirubin 0.3 - 1.2 mg/dL - 0.7  Alkaline Phos 38 - 126 U/L - 73  AST 15 - 41 U/L - 14(L)  ALT 0 - 44 U/L - 12   Assessment:  79 y.o. s/p Procedure(s): XI ROBOTIC ASSISTED TOTAL HYSTERECTOMY BILATERAL SALPINGO OOPHORECTOMY WITH OMENTECTOMY AND DEBULKING. EXPLORATORY LAPAROTOMY LYSIS OF ADHESION HERNIA REPAIR INCISIONAL WITH MESH: stable    Pain:  Pain is well controlled on Tylenol.  Patient has avoided taking narcotic medications.  We discussed goal of pain controlled sufficiently for her to be able to get up and walking.   Heme: Acute on chronic anemia in the setting of surgery with associated blood loss.  Patient is asymptomatic.  We will continue to follow.  ID: Leukocytosis  in the setting of recent surgery as well as Decadron.  No systemic signs or symptoms concerning for infection.  CV: Patient has a history of atrial fibrillation, currently on carvedilol and diltiazem for rate control.  She is also on Lasix daily - holding lasix today in the immediate post-op setting. On ppx lovenox. Hold her statin post-op for HLD.  GI/FEN:  Tolerating clears, will adv to a regular diet as tolerated. Hep lock IV and dc IVFs. Awaiting return of bowel function.  Endo: Hyperthyroidism - continue methimazole  dosing.   Psych: Continue duloxetine for history of GAD.  Prophylaxis: with daily lovenox.  Plan: Advance diet Encourage ambulation Discontinue IV fluids Dispo:  Will get PT consult today The patient is to be discharged to home, likely 6/24 or 6/25 pending progress.   LOS: 1 day    Lafonda Mosses 08/16/2019, 10:34 AM

## 2019-08-16 NOTE — Progress Notes (Signed)
1 Day Post-Op   Subjective/Chief Complaint: Pt with min pain this AM    Objective: Vital signs in last 24 hours: Temp:  [97.4 F (36.3 C)-98.5 F (36.9 C)] 98.5 F (36.9 C) (06/23 0556) Pulse Rate:  [54-65] 64 (06/23 0556) Resp:  [9-20] 20 (06/23 0556) BP: (123-186)/(55-80) 145/55 (06/23 0556) SpO2:  [95 %-100 %] 100 % (06/23 0556) FiO2 (%):  [3 %] 3 % (06/22 1945) Weight:  [64.4 kg] 64.4 kg (06/22 0843)    Intake/Output from previous day: 06/22 0701 - 06/23 0700 In: 3697.9 [P.O.:50; I.V.:3197.9; IV Piggyback:450] Out: 1075 [Urine:900; Blood:175] Intake/Output this shift: Total I/O In: 1247.9 [P.O.:50; I.V.:1197.9] Out: 400 [Urine:400]  PE:  Constitutional: No acute distress, conversant, appears states age. GI: Soft, no masses or hepatosplenomegaly, approp tender to palpation, inc c/d/i    Lab Results:  Recent Labs    08/16/19 0527  WBC 15.0*  HGB 9.9*  HCT 29.8*  PLT 140*   BMET Recent Labs    08/16/19 0527  NA 139  K 3.8  CL 103  CO2 26  GLUCOSE 173*  BUN 15  CREATININE 0.72  CALCIUM 8.2*   Assessment/Plan: s/p Procedure(s) with comments: XI ROBOTIC ASSISTED TOTAL HYSTERECTOMY BILATERAL SALPINGO OOPHORECTOMY WITH OMENTECTOMY AND DEBULKING. (Bilateral) EXPLORATORY LAPAROTOMY (N/A) - REQUESTING RNFA LYSIS OF ADHESION (N/A) HERNIA REPAIR INCISIONAL WITH MESH (N/A) Advance diet as tol from my standpoint Mobilize today in hall Likely home Thur/Fri if pain controlled, ambulating well, tol PO   LOS: 1 day    Megan Waller 08/16/2019

## 2019-08-16 NOTE — Evaluation (Signed)
Physical Therapy Evaluation Patient Details Name: Megan Waller MRN: [id number] DOB: September 17, 1940 Today's Date: 08/16/2019   History of Present Illness  79 year old female with a history of stage IV ovarian cancer, hypertension, A. fib, COPD; pt is s/p Robotic-assisted laparoscopic total hysterectomy with bilateral salpingoophorectomy, infracolic omentectomyLysis of adhesions and hernia reduction, repair of abdominal wall hernia on 08/15/19.  Clinical Impression  Pt admitted with above diagnosis. Pt most limited with bed mobility, requiring mod A to get into/out of bed. Pt with good steadiness upon standing and ambulating in hallway with RW. Minimal increase in pain with mobility and chronic numbness/tingling in feet from "pinched nerves and chemo". Pt's daughter present, able to stay with pt 24/7 upon d/c and reports confidence in assisting at home after observing eval; pt has all equipment needed. Pt currently with functional limitations due to the deficits listed below (see PT Problem List). Pt will benefit from skilled PT to increase their independence and safety with mobility to allow discharge to the venue listed below.       Follow Up Recommendations Home health PT    Equipment Recommendations  None recommended by PT    Recommendations for Other Services       Precautions / Restrictions Precautions Precautions: None Restrictions Weight Bearing Restrictions: No      Mobility  Bed Mobility Overal bed mobility: Needs Assistance Bed Mobility: Supine to Sit;Sit to Supine  Supine to sit: Mod assist Sit to supine: Mod assist   General bed mobility comments: educated pt and daughter on log roll to protect abdominal incisions and reduce strain, mod A to upright trunk through log roll to sit at EOB, mod A to lift BLE up into bed  Transfers Overall transfer level: Needs assistance Equipment used: Rolling walker (2 wheeled) Transfers: Sit to/from Stand Sit to Stand:  Supervision  General transfer comment: able to rise from EOB without physical assist, good steadiness  Ambulation/Gait Ambulation/Gait assistance: Supervision Gait Distance (Feet): 120 Feet Assistive device: Rolling walker (2 wheeled) Gait Pattern/deviations: Step-through pattern;Decreased stride length;Narrow base of support Gait velocity: slightly decreased   General Gait Details: ambulates in hallway with RW, good steadiness, maintains narrow BOS with stiff trunk posture, no loss of balance noted, limited 2* fatigue with amb  Stairs            Wheelchair Mobility    Modified Rankin (Stroke Patients Only)       Balance Overall balance assessment: Needs assistance Sitting-balance support: Feet supported;Bilateral upper extremity supported Sitting balance-Leahy Scale: Good Sitting balance - Comments: seated EOB   Standing balance support: During functional activity;Bilateral upper extremity supported Standing balance-Leahy Scale: Fair Standing balance comment: with RW                    Pertinent Vitals/Pain Pain Assessment: 0-10 Pain Score: 2  Pain Location: incision sites with mobility Pain Descriptors / Indicators: Aching;Sore Pain Intervention(s): Limited activity within patient's tolerance;Monitored during session    [place] expects to be discharged to:: Private residence Living Arrangements: Alone Available Help at Discharge: Family;Available 24 hours/day Type of Home: House Home Access: Ramped entrance     Home Layout: One level Home Equipment: Walker - 2 wheels;Bedside commode;Shower seat;Hand held shower head Additional Comments: Pt's daughter lives 2 doors down, available to live with pt while recovering and assist with mobility.    Prior Function Level of Independence: Independent         Comments: Pt reports ind with ADLs,  community ambulation without AD, drives limited distances.     Hand Dominance         Extremity/Trunk Assessment   Upper Extremity Assessment Upper Extremity Assessment: Defer to OT evaluation    Lower Extremity Assessment Lower Extremity Assessment: Overall WFL for tasks assessed (AROM WNL, strength grossly 4/5)    Cervical / Trunk Assessment Cervical / Trunk Assessment: Normal  Communication   Communication: No difficulties  Cognition Arousal/Alertness: Awake/alert Behavior During Therapy: WFL for tasks assessed/performed;Flat affect Overall Cognitive Status: Within Functional Limits for tasks assessed         General Comments      Exercises     Assessment/Plan    PT Assessment Patient needs continued PT services  PT Problem List Decreased activity tolerance;Decreased balance;Decreased mobility;Decreased skin integrity;Pain;Impaired sensation       PT Treatment Interventions DME instruction;Gait training;Functional mobility training;Therapeutic activities;Therapeutic exercise;Balance training;Patient/family education    PT Goals (Current goals can be found in the Care Plan section)  Acute Rehab PT Goals Patient Stated Goal: return home with daughter to assist PT Goal Formulation: With patient/family Time For Goal Achievement: 08/23/19 Potential to Achieve Goals: Good    Frequency Min 3X/week   Barriers to discharge        Co-evaluation               AM-PAC PT "6 Clicks" Mobility  Outcome Measure Help needed turning from your back to your side while in a flat bed without using bedrails?: A Little Help needed moving from lying on your back to sitting on the side of a flat bed without using bedrails?: A Lot Help needed moving to and from a bed to a chair (including a wheelchair)?: None Help needed standing up from a chair using your arms (e.g., wheelchair or bedside chair)?: None Help needed to walk in hospital room?: None Help needed climbing 3-5 steps with a railing? : None 6 Click Score: 21    End of Session   Activity Tolerance:  Patient tolerated treatment well;Patient limited by fatigue Patient left: in bed;with call bell/phone within reach;with family/visitor present Nurse Communication: Mobility status PT Visit Diagnosis: Other abnormalities of gait and mobility (R26.89);Pain Pain - part of body:  (abdomen)    Time: 8264-1583 PT Time Calculation (min) (ACUTE ONLY): 22 min   Charges:   PT Evaluation $PT Eval Low Complexity: 1 Low PT Treatments $Therapeutic Activity: 8-22 mins         Tori Glenis Musolf PT, DPT 08/16/19, 5:02 PM

## 2019-08-17 LAB — BASIC METABOLIC PANEL
Anion gap: 10 (ref 5–15)
BUN: 13 mg/dL (ref 8–23)
CO2: 25 mmol/L (ref 22–32)
Calcium: 8.3 mg/dL — ABNORMAL LOW (ref 8.9–10.3)
Chloride: 107 mmol/L (ref 98–111)
Creatinine, Ser: 0.69 mg/dL (ref 0.44–1.00)
GFR calc Af Amer: >60 mL/min (ref >60)
GFR calc non Af Amer: >60 mL/min (ref >60)
Glucose, Bld: 113 mg/dL — ABNORMAL HIGH (ref 70–99)
Potassium: 3.5 mmol/L (ref 3.5–5.1)
Sodium: 142 mmol/L (ref 135–145)

## 2019-08-17 LAB — CBC
HCT: 28.7 % — ABNORMAL LOW (ref 36.0–46.0)
Hemoglobin: 9 g/dL — ABNORMAL LOW (ref 12.0–15.0)
MCH: 30 pg (ref 26.0–34.0)
MCHC: 31.4 g/dL (ref 30.0–36.0)
MCV: 95.7 fL (ref 80.0–100.0)
Platelets: 120 10*3/uL — ABNORMAL LOW (ref 150–400)
RBC: 3 MIL/uL — ABNORMAL LOW (ref 3.87–5.11)
RDW: 14.2 % (ref 11.5–15.5)
WBC: 11.1 10*3/uL — ABNORMAL HIGH (ref 4.0–10.5)
nRBC: 0 % (ref 0.0–0.2)

## 2019-08-17 MED ORDER — APIXABAN 2.5 MG PO TABS: 2.5000 mg | ORAL_TABLET | Freq: Two times a day (BID) | ORAL | 0 refills | Status: DC

## 2019-08-17 NOTE — Progress Notes (Signed)
2 Days Post-Op   Subjective/Chief Complaint: Pt doing well Ambulated with min discomfort tol PO  Objective: Vital signs in last 24 hours: Temp:  [98 F (36.7 C)-98.6 F (37 C)] 98 F (36.7 C) (06/24 0559) Pulse Rate:  [64-70] 64 (06/24 0559) Resp:  [14-18] 15 (06/24 0559) BP: (118-139)/(50-64) 139/59 (06/24 0559) SpO2:  [92 %-100 %] 92 % (06/24 0559) Last BM Date: 08/14/19  Intake/Output from previous day: 06/23 0701 - 06/24 0700 In: 120 [P.O.:120] Out: 1400 [Urine:1400] Intake/Output this shift: No intake/output data recorded.  GI: soft, non-tender; bowel sounds normal; no masses,  no organomegaly and inc c/d/i  Lab Results:  Recent Labs    08/16/19 0527 08/17/19 0519  WBC 15.0* 11.1*  HGB 9.9* 9.0*  HCT 29.8* 28.7*  PLT 140* 120*   BMET Recent Labs    08/16/19 0527 08/17/19 0519  NA 139 142  K 3.8 3.5  CL 103 107  CO2 26 25  GLUCOSE 173* 113*  BUN 15 13  CREATININE 0.72 0.69  CALCIUM 8.2* 8.3*   Assessment/Plan: s/p Procedure(s) with comments: XI ROBOTIC ASSISTED TOTAL HYSTERECTOMY BILATERAL SALPINGO OOPHORECTOMY WITH OMENTECTOMY AND DEBULKING. (Bilateral) EXPLORATORY LAPAROTOMY (N/A) - REQUESTING RNFA LYSIS OF ADHESION (N/A) HERNIA REPAIR INCISIONAL WITH MESH (N/A) OK for DC from my standpoint D/w pt lifting restrictions Will f/u with me in 2-3 weeks.   LOS: 2 days    [name] 08/17/2019

## 2019-08-17 NOTE — Evaluation (Signed)
Occupational Therapy Evaluation Patient Details Name: Megan Waller MRN: [id number] DOB: 03-23-1940 Today's Date: 08/17/2019    History of Present Illness 79 year old female with a history of stage IV ovarian cancer, hypertension, A. fib, COPD; pt is s/p Robotic-assisted laparoscopic total hysterectomy with bilateral salpingoophorectomy, infracolic omentectomyLysis of adhesions and hernia reduction, repair of abdominal wall hernia on 08/15/19.   Clinical Impression   Megan Waller is a 79 year old woman with the above diagnosis who presents with generalized weakness, decreased activity and complaints of pain at surgery sites resulting in decreased ability to perform independent ADLs and functional mobility. Patient will benefit from skilled OT services to improve deficits in order for patient to improve functional abilities and return home at discharge. Patient and daughter report not needing OT services at home. Will see while in hospital to maximize functional abilities.    Follow Up Recommendations   (Family report no needs at discharge.)    Equipment Recommendations  None recommended by OT    Recommendations for Other Services       Precautions / Restrictions        Mobility Bed Mobility Overal bed mobility: Needs Assistance Bed Mobility: Supine to Sit     Supine to sit: Mod assist     General bed mobility comments: mod assist for trunk lift off and scooting to edge of bed.  Transfers Overall transfer level: Needs assistance Equipment used: Rolling walker (2 wheeled) Transfers: Sit to/from Stand Sit to Stand: Min assist;Mod assist         General transfer comment: min assist to stand from elevated bed, mod assist to stand from [facility].    Balance Overall balance assessment: Needs assistance Sitting-balance support: Feet supported;Bilateral upper extremity supported Sitting balance-Leahy Scale: Good     Standing balance support: During functional  activity;Bilateral upper extremity supported Standing balance-Leahy Scale: Fair                             ADL either performed or assessed with clinical judgement   ADL Overall ADL's : Needs assistance/impaired Eating/Feeding: Independent   Grooming: Set up;Sitting   Upper Body Bathing: Set up;Sitting   Lower Body Bathing: Set up;Minimal assistance   Upper Body Dressing : Set up;Sitting   Lower Body Dressing: Sit to/from stand;Moderate assistance;Set up   Toilet Transfer: Moderate assistance;Stand-pivot;BSC Toilet Transfer Details (indicate cue type and reason): mod assist to stand from [place] and Hygiene: Minimal assistance;Sit to/from stand Toileting - Clothing Manipulation Details (indicate cue type and reason): MIn assist for managing hospital gown - patient able to wipe herself.     Functional mobility during ADLs: Min guard General ADL Comments: min guard with RW with ambulation. Assistance and verbal cues for sit to stand - secondary to abdominal pain.     Vision   Vision Assessment?: No apparent visual deficits     Perception     Praxis      Pertinent Vitals/Pain Pain Assessment: Faces Faces Pain Scale: Hurts a little bit Pain Location: incision sites with mobility Pain Descriptors / Indicators: Aching;Sore Pain Intervention(s): Monitored during session;Limited activity within patient's tolerance     Hand Dominance     Extremity/Trunk Assessment     Lower Extremity Assessment Lower Extremity Assessment: Defer to PT evaluation   Cervical / Trunk Assessment Cervical / Trunk Assessment: Normal   Communication Communication Communication: No difficulties   Cognition Arousal/Alertness: Awake/alert Behavior During  Therapy: WFL for tasks assessed/performed;Flat affect Overall Cognitive Status: Within Functional Limits for tasks assessed                                     General Comments        Exercises     Shoulder Instructions      Home Living Family/patient expects to be discharged to:: Private residence Living Arrangements: Alone Available Help at Discharge: Family;Available 24 hours/day Type of Home: House Home Access: Ramped entrance     Home Layout: One level     Bathroom Shower/Tub: Occupational psychologist: Handicapped height     Home Equipment: Environmental consultant - 2 wheels;Bedside commode;Shower seat;Hand held shower head   Additional Comments: Pt's daughter lives 2 doors down, available to live with pt while recovering and assist with mobility.      Prior Functioning/Environment Level of Independence: Independent        Comments: Pt reports ind with ADLs, community ambulation without AD, drives limited distances.        OT Problem List: Decreased strength;Decreased activity tolerance;Impaired balance (sitting and/or standing);Decreased knowledge of use of DME or AE;Pain      OT Treatment/Interventions: Self-care/ADL training;Therapeutic activities;DME and/or AE instruction;Patient/family education;Balance training    OT Goals(Current goals can be found in the care plan section) Acute Rehab OT Goals Patient Stated Goal: return home with daughter to assist OT Goal Formulation: With patient/family Time For Goal Achievement: 08/31/19 Potential to Achieve Goals: Good  OT Frequency: Min 2X/week   Barriers to D/C:            Co-evaluation              AM-PAC OT "6 Clicks" Daily Activity     Outcome Measure Help from another person eating meals?: None Help from another person taking care of personal grooming?: A Little Help from another person toileting, which includes using toliet, bedpan, or urinal?: A Little Help from another person bathing (including washing, rinsing, drying)?: A Lot Help from another person to put on and taking off regular upper body clothing?: A Little Help from another person to put on and taking off regular  lower body clothing?: A Lot 6 Click Score: 17   End of Session Equipment Utilized During Treatment: Rolling walker Nurse Communication: Mobility status  Activity Tolerance: Patient tolerated treatment well Patient left: in chair;with call bell/phone within reach;with family/visitor present  OT Visit Diagnosis: Muscle weakness (generalized) (M62.81);Unsteadiness on feet (R26.81);Pain Pain - part of body:  (abdomen)                Time: 1583-0940 OT Time Calculation (min): 20 min Charges:  OT General Charges $OT Visit: 1 Visit OT Evaluation $OT Eval Low Complexity: 1 Low  Jeffie Widdowson, OTR/L [place]  Office [phone] Pager: [phone]   Lenward Chancellor 08/17/2019, 12:34 PM

## 2019-08-17 NOTE — Progress Notes (Signed)
D/C instructions given to patient and daughter. Patient or daughter had no questions NT or writer will wheel patient out once she is dressed

## 2019-08-17 NOTE — Discharge Instructions (Signed)
08/15/2019  Activity: 1. Be up and out of the bed during the day.  Take a nap if needed.  You may walk up steps but be careful and use the hand rail.  Stair climbing will tire you more than you think, you may need to stop part way and rest.   2. No lifting or straining for 6 weeks.  3. No driving for 1-2 weeks.  Do Not drive if you are taking narcotic pain medicine and until you feel that you can brake safely.  4. Shower daily.  Use soap and water on your incision and pat dry; don't rub.   5. No sexual activity and nothing in the vagina for 8 weeks.  Medications:  - Take ibuprofen and tylenol first line for pain control. Take these regularly (every 6 hours) to decrease the build up of pain.  - If necessary, for severe pain not relieved by ibuprofen, take oxycodone.  - While taking percocet you should take sennakot every night to reduce the likelihood of constipation. If this causes diarrhea, stop its use.  Diet: 1. Low sodium Heart Healthy Diet is recommended.  2. It is safe to use a laxative if you have difficulty moving your bowels.   Wound Care: 1. Keep clean and dry.  Shower daily.  Reasons to call the Doctor:   Fever - Oral temperature greater than 100.4 degrees Fahrenheit  Foul-smelling vaginal discharge  Difficulty urinating  Nausea and vomiting  Increased pain at the site of the incision that is unrelieved with pain medicine.  Difficulty breathing with or without chest pain  New calf pain especially if only on one side  Sudden, continuing increased vaginal bleeding with or without clots.   Follow-up: 1. See Jeral Pinch in 3 weeks.  Contacts: For questions or concerns you should contact:  Dr. Jeral Pinch at [phone] After hours and on week-ends call [phone] and ask to speak to the physician on call for Gynecologic Oncology   [facility] _______[facility]  HERNIA REPAIR: POST OP INSTRUCTIONS  Always review your discharge  instruction sheet given to you by the facility where your surgery was performed. IF YOU HAVE DISABILITY OR FAMILY LEAVE FORMS, YOU MUST BRING THEM TO THE OFFICE FOR PROCESSING.   DO NOT GIVE THEM TO YOUR DOCTOR.  1. A  prescription for pain medication may be given to you upon discharge.  Take your pain medication as prescribed, if needed.  If narcotic pain medicine is not needed, then you may take acetaminophen (Tylenol) or ibuprofen (Advil) as needed. 2. Take your usually prescribed medications unless otherwise directed. If you need a refill on your pain medication, please contact your pharmacy.  They will contact our office to request authorization. Prescriptions will not be filled after 5 pm or on week-ends. 3. You should follow a light diet the first 24 hours after arrival home, such as soup and crackers, etc.  Be sure to include lots of fluids daily.  Resume your normal diet the day after surgery. 4.Most patients will experience some swelling and bruising around the umbilicus or in the groin and scrotum.  Ice packs and reclining will help.  Swelling and bruising can take several days to resolve.  6. It is common to experience some constipation if taking pain medication after surgery.  Increasing fluid intake and taking a stool softener (such as Colace) will usually help or prevent this problem from occurring.  A mild laxative (Milk of Magnesia or Miralax) should  be taken according to package directions if there are no bowel movements after 48 hours. 7. Unless discharge instructions indicate otherwise, you may remove your bandages 24-48 hours after surgery, and you may shower at that time.  You may have steri-strips (small skin tapes) in place directly over the incision.  These strips should be left on the skin for 7-10 days.  If your surgeon used skin glue on the incision, you may shower in 24 hours.  The glue will flake off over the next 2-3 weeks.  Any sutures or staples will be removed at the  office during your follow-up visit. 8. ACTIVITIES:  You may resume regular (light) daily activities beginning the next day--such as daily self-care, walking, climbing stairs--gradually increasing activities as tolerated.  You may have sexual intercourse when it is comfortable.  Refrain from any heavy lifting or straining until approved by your doctor.  a.You may drive when you are no longer taking prescription pain medication, you can comfortably wear a seatbelt, and you can safely maneuver your car and apply brakes. b.RETURN TO WORK:   _____________________________________________  9.You should see your doctor in the office for a follow-up appointment approximately 2-3 weeks after your surgery.  Make sure that you call for this appointment within a day or two after you arrive home to insure a convenient appointment time. 10.OTHER INSTRUCTIONS: _________________________    _____________________________________  WHEN TO CALL YOUR DOCTOR: 1. Fever over 101.0 2. Inability to urinate 3. Nausea and/or vomiting 4. Extreme swelling or bruising 5. Continued bleeding from incision. 6. Increased pain, redness, or drainage from the incision  The clinic staff is available to answer your questions during regular business hours.  Please don't hesitate to call and ask to speak to one of the nurses for clinical concerns.  If you have a medical emergency, go to the nearest emergency room or call 911.  A surgeon from [facility] is always on call at the hospital   [address] [place], [place], [place]  [address] ?  [address], [place], [place]   [address] [phone] ? [phone] ? FAX [phone] Web site: [email or web address]

## 2019-08-17 NOTE — Discharge Summary (Signed)
Physician Discharge Summary  Patient ID: Megan Waller MRN: [id number] DOB/AGE: Jul 03, 1940 79 y.o.  Admit date: 08/15/2019 Discharge date: 08/17/2019  Admission Diagnoses: <principal problem not specified>  Discharge Diagnoses:  Active Problems:   Ovarian cancer [facility])   Discharged Condition: good  Hospital Course:  1/ patient was admitted on 6/22 for post-op care after robotic interval tumor debulking for ovarian cancer and reduction and repair of ventral hernia. 2/ surgery was uncomplicated.  3/ on postoperative day 2 the patient was meeting discharge criteria: tolerating PO, voiding urine, reporting flatus, ambulating, pain well controlled on oral medications.  4/ new medications on discharge include: Eliquis for DVT prophylaxis.  She has a phone visit with me next week and will see myself and Dr. Rosendo Gros for inperson follow-up.  Consults: PT/OT  Significant Diagnostic Studies: CBC    Component Value Date/Time   WBC 11.1 (H) 08/17/2019 0519   RBC 3.00 (L) 08/17/2019 0519   HGB 9.0 (L) 08/17/2019 0519   HCT 28.7 (L) 08/17/2019 0519   PLT 120 (L) 08/17/2019 0519   MCV 95.7 08/17/2019 0519   MCH 30.0 08/17/2019 0519   MCHC 31.4 08/17/2019 0519   RDW 14.2 08/17/2019 0519   LYMPHSABS 2.3 08/11/2019 1343   MONOABS 0.5 08/11/2019 1343   EOSABS 0.5 08/11/2019 1343   BASOSABS 0.1 08/11/2019 1343   CMP Latest Ref Rng & Units 08/17/2019 08/16/2019 08/11/2019  Glucose 70 - 99 mg/dL 113(H) 173(H) 141(H)  BUN 8 - 23 mg/dL 13 15 13   Creatinine 0.44 - 1.00 mg/dL 0.69 0.72 0.68  Sodium 135 - 145 mmol/L 142 139 144  Potassium 3.5 - 5.1 mmol/L 3.5 3.8 3.4(L)  Chloride 98 - 111 mmol/L 107 103 103  CO2 22 - 32 mmol/L 25 26 29   Calcium 8.9 - 10.3 mg/dL 8.3(L) 8.2(L) 8.9  Total Protein 6.5 - 8.1 g/dL - - 6.8  Total Bilirubin 0.3 - 1.2 mg/dL - - 0.7  Alkaline Phos 38 - 126 U/L - - 73  AST 15 - 41 U/L - - 14(L)  ALT 0 - 44 U/L - - 12   Treatments: IV hydration  Discharge  Exam: Blood pressure (!) 139/59, pulse 64, temperature 98 F (36.7 C), temperature source Oral, resp. rate 15, height 5\' 2"  (1.575 m), weight 142 lb (64.4 kg), SpO2 92 %. General: Alert, oriented, no acute distress. HEENT:Normocephalic, atraumatic, sclera anicteric. Chest: Clear to auscultation bilaterally.No wheezes or rhonchi. Cardiovascular: Regular rate and rhythm, no murmurs. Abdomen: Obese, soft,mildlytenderto palpation around the incision. Incision with honeycomb dressing that is bloodstained but otherwise clean, dry and intact. + bowel sounds. Abdominal binder in place. Extremities: Grossly normal range of motion. Warm, well perfused. No edema bilaterally.  Disposition: Discharge disposition: 01-Home or Self Care       Discharge Instructions    Call MD for:   Complete by: As directed    Call MD for:  difficulty breathing, headache or visual disturbances   Complete by: As directed    Call MD for:  extreme fatigue   Complete by: As directed    Call MD for:  hives   Complete by: As directed    Call MD for:  persistant dizziness or light-headedness   Complete by: As directed    Call MD for:  persistant nausea and vomiting   Complete by: As directed    Call MD for:  redness, tenderness, or signs of infection (pain, swelling, redness, odor or green/yellow discharge around incision site)  Complete by: As directed    Call MD for:  severe uncontrolled pain   Complete by: As directed    Call MD for:  temperature >100.4   Complete by: As directed    Diet - low sodium heart healthy   Complete by: As directed    Discharge wound care:   Complete by: As directed    See dc instructions   Increase activity slowly   Complete by: As directed      Allergies as of 08/17/2019      Reactions   Ezetimibe-simvastatin Other (See Comments)   Aching      Medication List    TAKE these medications   apixaban 2.5 MG Tabs tablet Commonly known as: Eliquis Take 1 tablet (2.5  mg total) by mouth 2 (two) times daily.   aspirin 81 MG EC tablet Take by mouth.   carvedilol 25 MG tablet Commonly known as: COREG Take 25 mg by mouth 2 (two) times daily.   diltiazem 120 MG 24 hr capsule Commonly known as: CARDIZEM CD Take 120 mg by mouth daily.   DULoxetine 20 MG capsule Commonly known as: CYMBALTA Take 40 mg by mouth daily.   furosemide 40 MG tablet Commonly known as: LASIX Take 40 mg by mouth daily.   ibuprofen 600 MG tablet Commonly known as: ADVIL Take 1 tablet (600 mg total) by mouth every 8 (eight) hours as needed for moderate pain. For AFTER surgery only   Klor-Con M20 20 MEQ tablet Generic drug: potassium chloride SA Take 20 mEq by mouth daily.   methimazole 5 MG tablet Commonly known as: TAPAZOLE Take 5 mg by mouth daily.   oxyCODONE 5 MG immediate release tablet Commonly known as: Oxy IR/ROXICODONE Take 1 tablet (5 mg total) by mouth every 4 (four) hours as needed for severe pain. For AFTER surgery, do not take and drive   rosuvastatin 10 MG tablet Commonly known as: CRESTOR Take 10 mg by mouth every Monday.   senna-docusate 8.6-50 MG tablet Commonly known as: Senokot-S Take 2 tablets by mouth at bedtime. For AFTER surgery, do not take if having diarrhea   Travoprost (BAK Free) 0.004 % Soln ophthalmic solution Commonly known as: TRAVATAN Place 1 drop into both eyes at bedtime.            Discharge Care Instructions  (From admission, onward)         Start     Ordered   08/17/19 0000  Discharge wound care:       Comments: See dc instructions   08/17/19 1134          Follow-up Information    Lafonda Mosses, MD In 1 week.   Specialty: Gynecologic Oncology Why: 1 week for pathology call, 3 week visit in the office Contact information: [place] [address] [phone]        [name], MD. Schedule an appointment as soon as possible for a visit in 2 week(s).   Specialty: General  Surgery Contact information: [place]  [place] [address] [phone]               Signed: Lafonda Mosses 08/17/2019, 11:35 AM

## 2019-08-18 ENCOUNTER — Telehealth: Payer: Self-pay | Admitting: Genetic Counselor

## 2019-08-18 ENCOUNTER — Telehealth: Payer: Self-pay

## 2019-08-18 NOTE — Telephone Encounter (Signed)
[name] states that she is eating, drinking, and urinating well.  Passing gas.Told her to increase the senokot-s to 2 tabs bid with 8 oz of water.  If she has not had a BM by 2 pm tomorrow she needs to add 1 capful of Miralax bid until she has a BM. Then adjust medication as needed. Afebrile. Incisions are D&I. Honeycomb dressing removed today per Dr. Rosendo Gros. Pain controled with current medications. She is aware of her post-op appointments as well as the office number [phone] to call if she has any questions or concerns.

## 2019-08-18 NOTE — Telephone Encounter (Signed)
Called Megan Waller's daughter, Megan Waller, per request of Megan Waller to coordinate genetic testing. Reviewed recommendations and details of genetic testing. Set up an appointment for [name] to have her blood drawn after her appointment with Megan Waller on 09/15/19.   Her blood and tumor samples will be sent to [facility] [place] for analysis of the TumorNext-HRD+CancerNext panel. Results should be available within approximately three-four weeks' time after the laboratory receives her blood sample, at which point they will be disclosed by telephone to Megan Waller, as will any additional recommendations warranted by these results.

## 2019-08-21 ENCOUNTER — Telehealth: Payer: Self-pay | Admitting: Gynecologic Oncology

## 2019-08-22 ENCOUNTER — Encounter: Payer: Self-pay | Admitting: Gynecologic Oncology

## 2019-08-22 ENCOUNTER — Inpatient Hospital Stay (HOSPITAL_BASED_OUTPATIENT_CLINIC_OR_DEPARTMENT_OTHER): Payer: Medicare Other | Admitting: Gynecologic Oncology

## 2019-08-22 ENCOUNTER — Other Ambulatory Visit: Payer: Self-pay

## 2019-08-22 DIAGNOSIS — C562 Malignant neoplasm of left ovary: Secondary | ICD-10-CM | POA: Diagnosis not present

## 2019-08-22 DIAGNOSIS — C561 Malignant neoplasm of right ovary: Secondary | ICD-10-CM

## 2019-08-22 DIAGNOSIS — C563 Malignant neoplasm of bilateral ovaries: Secondary | ICD-10-CM

## 2019-08-22 NOTE — Progress Notes (Signed)
Gynecologic Oncology Telehealth Consult Note: Gyn-Onc  I connected with Megan Waller on 08/22/19 at  3:15 PM EDT by telephone and verified that I am speaking with the correct person using two identifiers.  I discussed the limitations, risks, security and privacy concerns of performing an evaluation and management service by telemedicine and the availability of in-person appointments. I also discussed with the patient that there may be a patient responsible charge related to this service. The patient expressed understanding and agreed to proceed.  Other persons participating in the visit and their role in the encounter: Megan Waller, patient's daughter.  Patient's location: home Provider's location: [facility]  Reason for Visit: follow-up after surgery, treatment discussion  Treatment History: Oncology History Overview Note  Patient presented in December with an abdominal nodule over her left medial abdomen that grew progressively over time.  She was seen by dermatology for this lesion and underwent a biopsy.  Ca-125: 1/13: 180 2/10: 96.9 3/16: 12.1 4/5: 9.7   Ovarian cancer ([place])  02/14/2019 Initial Biopsy   Left lower abdominal skin biopsy, metastatic adenocarcinoma.  Based on histologic and IHC findings, a gynecologic primary is favored.   02/14/2019 Initial Diagnosis   Ovarian cancer ([place])   03/06/2019 Imaging   CT chest, abdomen, pelvis: Widespread peritoneal carcinomatosis with bilateral adnexal masses, bilateral external iliac adenopathy and a subcutaneous mass in the left periumbilical region, likely a soft tissue metastasis.  No evidence of thoracic metastatic disease.  Enlarging complex cystic and solid left thyroid nodule.  Large recurrent left anterior abdominal wall hernia containing portion of the transverse colon without evidence of incarceration or obstruction.  Cholelithiasis and aortic atherosclerosis.   03/15/2019 -  Chemotherapy   Carboplatin/taxol Cycle 1:  1/20 Cycle 2: 2/23 (delayed secondary to side effects), 25% dose reduction Cycle 3: 3/16  Cycle 5: 4/29; continued Neulasta to prevent severe neutropenia from delaying cycles of tx.     03/17/2019 Imaging   CT chest: No pulmonary embolus noted.  Small pleural effusions with bibasilar atelectasis.  No adenopathy.  Dominant left lobe thyroid mass, stable from prior CT.   05/29/2019 Imaging   CT abdomen and pelvis: Interval reduction in size of bilateral ovarian masses, varies now measuring within normal limits (2 x 1.4 cm on the right and 1.7 x 1.4 cm on the left), essentially complete resolution of previously seen extensive metastatic omental and peritoneal nodularity about the abdomen and pelvis.  Resolution of bilateral inguinal lymphadenopathy. Parastomal hernia of the left lower quadrant containing nonobstructed loops of transverse colon and mid small bowel.   07/20/2019 Imaging   CT of the abdomen and pelvis shows stable 11 mm well-circumscribed left adrenal lesion in the body of the left adrenal.  Small cyst in the lower pole of the right kidney.  Signs of prior bowel resection of the small bowel and colon with anastomotic sites.  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  No adenopathy in the retroperitoneum or upper abdomen.  Right active adnexa measures 2 x 1.4 cm compared to 1.9 x 1.4 cm on last imaging.  Left adnexa measures 1.6 x 1.6 cm.  Uterus grossly normal on CT.  No pelvic adenopathy.  Ventral suprapubic hernia containing small bowel.  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   08/15/2019 Surgery   Robotic-assisted laparoscopic total hysterectomy with bilateral  salpingoophorectomy, infracolic omentectomy Lysis of adhesions and hernia reduction (Dr. Rosendo Gros), repair of abdominal wall hernia (Dr. Rosendo Gros)  On  EUA, small mobile uterus. On intra-abdominal entry, normal appearing liver edge, diaphragm, and stomach. Omentum adherent to the anterior abdominal wall along patient's prior midline incision. Transverse colon hernia through prior ostomy site. Otherwise, some adhesions of the sigmoid to the left sidewall. Uterus 6cm and normal appearing. Right adnexa normal in appearance. Left adnexa with adhesions to the sigmoid colon and adherent to the medial aspect of the broad ligament. Left ovary with evidence of treatment effect. Several small (<28m) nodules on the peritoneum within the cul de sac c/w treated tumor implants (cauterized). Omentum without obvious evidence of tumor burden.   08/15/2019 Pathology Results   A. UTERUS, BILATERAL TUBES AND OVARIES, HYSTERECTOMY AND  BILATERALSALPINGO- OOPHORECTOMY:   Ovaries, bilateral:  -  Residual carcinoma status post neoadjuvant therapy involving  bilateral ovaries  -  See oncology table and comment below   Fallopian tubes, bilateral:  -  Benign fallopian tubes  -  No malignancy identified   Uterus:  -  Benign endometrial polyp  -  Leiomyoma (1.1 cm)  -  Serosa with focal calcifications  -  No hyperplasia or malignancy identified   Cervix:  -  Benign cervix with nabothian cysts  -  No dysplasia or malignancy identified   B. OMENTUM, RESECTION:  -  Microscopic focus of metastatic adenocarcinoma (< 166m   ONCOLOGY TABLE:   OVARY or FALLOPIAN TUBE or PRIMARY PERITONEUM:   Procedure: Hysterectomy, bilateral salpingo-oophorectomy and omentectomy  Specimen Integrity: Intact  Tumor Site: Ovary  Ovarian Surface Involvement: Present  Fallopian Tube Surface Involvement: Not identified  Tumor Size: 0.8 cm  Histologic Type: Carcinoma, subtype cannot be determined; See comment  Histologic Grade: High-grade; see comment  Implants: Not identified  Other Tissue/ Organ Involvement: Not identified  Largest Extrapelvic Peritoneal Focus: < 1 mm   Peritoneal/Ascitic Fluid: Not applicable  Treatment Effect: Marked response with no or minimal residual cancer  (CRS 3)  Regional Lymph Nodes: No lymph nodes submitted or found  Pathologic Stage Classification (pTNM, AJCC 8th Edition): ypT3a, ypNX  Representative Tumor Block: A9  Comment(s): This case is being staged as if it were a high-grade serous  carcinoma.  Given that the morphologic and histologic assessment is  being rendered status post neoadjuvant therapy, the diagnostic features  of serous carcinoma are not readily apparent.  In fact, there are  scattered well-formed glandular elements within the foci of tumor. There  are other areas with high-grade nuclei and scattered psammomatous  calcifications.  Residual tumor is present in both the right and left  ovaries with surface involvement.  The tumor foci in the right ovary  measures greater than 2 mm in maximum size.  However, the focus of  metastatic carcinoma in the omentum shows only a single focus which  measures less than 1 mm.  Based on this assessment, CRS 3 is assigned to  this case.      Interval History: Overall, the patient has done well since being discharged. Her pain is improving daily. She is able to be mobile more easily on her own now without assistance. She is tolerating a PO diet without N/V. She's had return of bowel function. Denies any urinary symptoms. She endorses minimal vaginal spotting, denies any significant bleeding or discharge. Incisions seem to be healing well.   Past Medical/Surgical History: Past Medical History:  Diagnosis Date   Abdominal hernia  Acute on chronic respiratory failure with hypoxia (HCC) 03/25/2019   Anxiety    Anxiety    Atrial fibrillation ([place]) 03/24/2019   Chest pressure 09/18/2015   Community acquired bilateral lower lobe pneumonia 6/64/4034   Complication of anesthesia    COPD (chronic obstructive pulmonary disease) ([place]) 03/27/2019   Diverticulitis     Essential hypertension 09/18/2015   Family history of breast cancer    Family history of colon cancer    Family history of melanoma    Family history of prostate cancer    GAD (generalized anxiety disorder) 12/01/2015   Glaucoma    Hypercholesteremia    Hypertension    Hyperthyroidism    Hypokalemia 09/18/2015   Ovarian cancer ([place])    Parkinson disease ([place]) 12/01/2015   Primary insomnia 12/01/2015   Skin cancer    squamous cell   Weakness 09/18/2015    Past Surgical History:  Procedure Laterality Date   BREAST LUMPECTOMY     INCISIONAL HERNIA REPAIR N/A 08/15/2019   Procedure: HERNIA REPAIR INCISIONAL WITH MESH;  Surgeon: [name], MD;  Location: [facility];  Service: General;  Laterality: N/A;   LAPAROTOMY N/A 08/15/2019   Procedure: EXPLORATORY LAPAROTOMY;  Surgeon: [name], MD;  Location: [facility];  Service: General;  Laterality: N/A;  REQUESTING RNFA   LYSIS OF ADHESION N/A 08/15/2019   Procedure: LYSIS OF ADHESION;  Surgeon: [name], MD;  Location: [facility];  Service: General;  Laterality: N/A;   PARTIAL COLECTOMY     had partial colectomy for diverticular disease, required an ostomy followed by stoma revision   right hand surgery     squamous cell skin cancer resections     3    Family History  Problem Relation Age of Onset   Stroke Mother    Heart attack Father    Skin cancer Father        non-melanoma; dx. in his late 54s   Breast cancer Sister 30   Prostate cancer Brother 65       metastatic   Colon cancer Brother 63   Melanoma Brother    Breast cancer Sister 72   Breast cancer Niece 91   Colon cancer Paternal Aunt        dx. in her 35s or 33s   Cancer Paternal Uncle        mole on back?, dx. in his 17s   Breast cancer Other 80       paternal great-aunt (grandmother's sister)   Melanoma Brother 74   Colon cancer Paternal Aunt        dx. older than 4   Throat cancer Paternal Aunt        dx. in her 4s    Breast cancer Cousin        dx. in her 20s or 79s (maternal first cousin)   Breast cancer Cousin        dx. in her 54s or 61s (maternal first cousin)   Breast cancer Cousin        dx. in her 88s (maternal first cousin)    Social History   Socioeconomic History   Marital status: Married    Spouse name: Not on file   Number of children: Not on file   Years of education: Not on file   Highest education level: Not on file  Occupational History   Not on file  Tobacco Use   Smoking status: Former Smoker   Smokeless tobacco: Never Used  Tobacco comment: Quit 29 years ago  Vaping Use   Vaping Use: Never used  Substance and Sexual Activity   Alcohol use: Never   Drug use: Never   Sexual activity: Not on file  Other Topics Concern   Not on file  Social History Narrative   Not on file   Social Determinants of Health   Financial Resource Strain:    Difficulty of Paying Living Expenses:   Food Insecurity:    Worried About Charity fundraiser in the Last Year:    Arboriculturist in the Last Year:   Transportation Needs:    Film/video editor (Medical):    Lack of Transportation (Non-Medical):   Physical Activity:    Days of Exercise per Week:    Minutes of Exercise per Session:   Stress:    Feeling of Stress :   Social Connections:    Frequency of Communication with Friends and Family:    Frequency of Social Gatherings with Friends and Family:    Attends Religious Services:    Active Member of Clubs or Organizations:    Attends Archivist Meetings:    Marital Status:     Current Medications:  Current Outpatient Medications:    apixaban (ELIQUIS) 2.5 MG TABS tablet, Take 1 tablet (2.5 mg total) by mouth 2 (two) times daily., Disp: 24 tablet, Rfl: 0   aspirin 81 MG EC tablet, Take by mouth., Disp: , Rfl:    carvedilol (COREG) 25 MG tablet, Take 25 mg by mouth 2 (two) times daily., Disp: , Rfl:    diltiazem (CARDIZEM  CD) 120 MG 24 hr capsule, Take 120 mg by mouth daily., Disp: , Rfl:    DULoxetine (CYMBALTA) 20 MG capsule, Take 40 mg by mouth daily., Disp: , Rfl:    furosemide (LASIX) 40 MG tablet, Take 40 mg by mouth daily., Disp: , Rfl:    ibuprofen (ADVIL) 600 MG tablet, Take 1 tablet (600 mg total) by mouth every 8 (eight) hours as needed for moderate pain. For AFTER surgery only, Disp: 15 tablet, Rfl: 0   methimazole (TAPAZOLE) 5 MG tablet, Take 5 mg by mouth daily. , Disp: , Rfl:    oxyCODONE (OXY IR/ROXICODONE) 5 MG immediate release tablet, Take 1 tablet (5 mg total) by mouth every 4 (four) hours as needed for severe pain. For AFTER surgery, do not take and drive, Disp: 10 tablet, Rfl: 0   potassium chloride SA (KLOR-CON M20) 20 MEQ tablet, Take 20 mEq by mouth daily., Disp: , Rfl:    rosuvastatin (CRESTOR) 10 MG tablet, Take 10 mg by mouth every Monday. , Disp: , Rfl:    senna-docusate (SENOKOT-S) 8.6-50 MG tablet, Take 2 tablets by mouth at bedtime. For AFTER surgery, do not take if having diarrhea, Disp: 30 tablet, Rfl: 0   Travoprost, BAK Free, (TRAVATAN) 0.004 % SOLN ophthalmic solution, Place 1 drop into both eyes at bedtime. , Disp: , Rfl:   Review of Symptoms: Review is negative except as above in Interval History.  Physical Exam: There were no vitals taken for this visit. Deferred given limitations of phone visit  Laboratory & Radiologic Studies: A. UTERUS, BILATERAL TUBES AND OVARIES, HYSTERECTOMY AND  BILATERALSALPINGO- OOPHORECTOMY:   Ovaries, bilateral:  - Residual carcinoma status post neoadjuvant therapy involving  bilateral ovaries  - See oncology table and comment below   Fallopian tubes, bilateral:  - Benign fallopian tubes  - No malignancy identified   Uterus:  -  Benign endometrial polyp  - Leiomyoma (1.1 cm)  - Serosa with focal calcifications  - No hyperplasia or malignancy identified   Cervix:  - Benign cervix with nabothian cysts  - No  dysplasia or malignancy identified   B. OMENTUM, RESECTION:  - Microscopic focus of metastatic adenocarcinoma (< 23m)   ONCOLOGY TABLE:   OVARY or FALLOPIAN TUBE or PRIMARY PERITONEUM:   Procedure: Hysterectomy, bilateral salpingo-oophorectomy and omentectomy  Specimen Integrity: Intact  Tumor Site: Ovary  Ovarian Surface Involvement: Present  Fallopian Tube Surface Involvement: Not identified  Tumor Size: 0.8 cm  Histologic Type: Carcinoma, subtype cannot be determined; See comment  Histologic Grade: High-grade; see comment  Implants: Not identified  Other Tissue/ Organ Involvement: Not identified  Largest Extrapelvic Peritoneal Focus: < 1 mm  Peritoneal/Ascitic Fluid: Not applicable  Treatment Effect: Marked response with no or minimal residual cancer  (CRS 3)  Regional Lymph Nodes: No lymph nodes submitted or found  Pathologic Stage Classification (pTNM, AJCC 8th Edition): ypT3a, ypNX  Representative Tumor Block: A9  Comment(s): This case is being staged as if it were a high-grade serous  carcinoma. Given that the morphologic and histologic assessment is  being rendered status post neoadjuvant therapy, the diagnostic features  of serous carcinoma are not readily apparent. In fact, there are  scattered well-formed glandular elements within the foci of tumor. There  are other areas with high-grade nuclei and scattered psammomatous  calcifications. Residual tumor is present in both the right and left  ovaries with surface involvement. The tumor foci in the right ovary  measures greater than 2 mm in maximum size. However, the focus of  metastatic carcinoma in the omentum shows only a single focus which  measures less than 1 mm. Based on this assessment, CRS 3 is assigned to  this case.   Assessment & Plan: BKately Waller a 79y.o. 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  Patient is continuing to do well at home and meeting milestones. We discussed expectations and continued restrictions. I reviewed pathology with the patient and her daughter - overall excellent pathologic evidence of response to chemotherapy. The patient is scheduled to have blood drawn for genetic testing when she comes for her visit with me in July. I reviewed my discussion with Dr. LBobby Rumpfyesterday - I would recommend at least consideration of PARPi maintenance therapy (with niraparib if no BRCA mutation found) given results of PRIMA. All quesitons answered. Patient and daughter aware of follow-up visit with me.  I discussed the assessment and treatment plan with the patient. The patient was provided with an opportunity to ask questions and all were answered. The patient agreed with the plan and demonstrated an understanding of the instructions.   The patient was advised to call back or see an in-person evaluation if the symptoms worsen or if the condition fails to improve as anticipated.   18 minutes of total time was spent for this patient encounter, including preparation, face-to-face counseling with the patient and coordination of care, and documentation of the encounter.   KJeral Pinch MD  Division of Gynecologic Oncology  Department of Obstetrics and Gynecology  [facility]

## 2019-09-15 ENCOUNTER — Encounter: Payer: Self-pay | Admitting: Gynecologic Oncology

## 2019-09-15 ENCOUNTER — Inpatient Hospital Stay: Payer: Medicare Other

## 2019-09-15 ENCOUNTER — Other Ambulatory Visit: Payer: Self-pay

## 2019-09-15 ENCOUNTER — Inpatient Hospital Stay: Payer: Medicare Other | Attending: Gynecologic Oncology | Admitting: Gynecologic Oncology

## 2019-09-15 VITALS — BP 126/56 | HR 61 | Temp 99.1°F | Resp 16 | Ht 62.0 in | Wt 142.2 lb

## 2019-09-15 DIAGNOSIS — Z90722 Acquired absence of ovaries, bilateral: Secondary | ICD-10-CM | POA: Insufficient documentation

## 2019-09-15 DIAGNOSIS — Z803 Family history of malignant neoplasm of breast: Secondary | ICD-10-CM | POA: Diagnosis not present

## 2019-09-15 DIAGNOSIS — C786 Secondary malignant neoplasm of retroperitoneum and peritoneum: Secondary | ICD-10-CM

## 2019-09-15 DIAGNOSIS — Z808 Family history of malignant neoplasm of other organs or systems: Secondary | ICD-10-CM | POA: Diagnosis not present

## 2019-09-15 DIAGNOSIS — Z90710 Acquired absence of both cervix and uterus: Secondary | ICD-10-CM | POA: Diagnosis not present

## 2019-09-15 DIAGNOSIS — C569 Malignant neoplasm of unspecified ovary: Secondary | ICD-10-CM | POA: Insufficient documentation

## 2019-09-15 DIAGNOSIS — C563 Malignant neoplasm of bilateral ovaries: Secondary | ICD-10-CM

## 2019-09-15 DIAGNOSIS — C561 Malignant neoplasm of right ovary: Secondary | ICD-10-CM

## 2019-09-15 DIAGNOSIS — Z9221 Personal history of antineoplastic chemotherapy: Secondary | ICD-10-CM | POA: Diagnosis not present

## 2019-09-15 LAB — GENETIC SCREENING ORDER

## 2019-09-15 NOTE — Patient Instructions (Signed)
You are healing well from my portion of the surgery.  Keep your follow-up with Dr. Rosendo Gros next month.  I will see you in December.  If you develop any new symptoms such as abdominal pain, bloating, decreased appetite, weight loss, or anything else concerning, please reach out to Dr. Bobby Rumpf or myself.  We will contact you when we have the results of your genetic testing back.

## 2019-09-15 NOTE — Progress Notes (Signed)
Gynecologic Oncology Return Clinic Visit  09/15/19  Reason for Visit: post-op follow-up and treatment counseling  Treatment History: Oncology History Overview Note  Patient presented in December with an abdominal nodule over her left medial abdomen that grew progressively over time.  She was seen by dermatology for this lesion and underwent a biopsy.  Ca-125: 1/13: 180 2/10: 96.9 3/16: 12.1 4/5: 9.7   Ovarian cancer ([place])  02/14/2019 Initial Biopsy   Left lower abdominal skin biopsy, metastatic adenocarcinoma.  Based on histologic and IHC findings, a gynecologic primary is favored.   02/14/2019 Initial Diagnosis   Ovarian cancer ([place])   03/06/2019 Imaging   CT chest, abdomen, pelvis: Widespread peritoneal carcinomatosis with bilateral adnexal masses, bilateral external iliac adenopathy and a subcutaneous mass in the left periumbilical region, likely a soft tissue metastasis.  No evidence of thoracic metastatic disease.  Enlarging complex cystic and solid left thyroid nodule.  Large recurrent left anterior abdominal wall hernia containing portion of the transverse colon without evidence of incarceration or obstruction.  Cholelithiasis and aortic atherosclerosis.   03/15/2019 -  Chemotherapy   Carboplatin/taxol Cycle 1: 1/20 Cycle 2: 2/23 (delayed secondary to side effects), 25% dose reduction Cycle 3: 3/16  Cycle 5: 4/29; continued Neulasta to prevent severe neutropenia from delaying cycles of tx.     03/17/2019 Imaging   CT chest: No pulmonary embolus noted.  Small pleural effusions with bibasilar atelectasis.  No adenopathy.  Dominant left lobe thyroid mass, stable from prior CT.   05/29/2019 Imaging   CT abdomen and pelvis: Interval reduction in size of bilateral ovarian masses, varies now measuring within normal limits (2 x 1.4 cm on the right and 1.7 x 1.4 cm on the left), essentially complete resolution of previously seen extensive metastatic omental and peritoneal nodularity  about the abdomen and pelvis.  Resolution of bilateral inguinal lymphadenopathy. Parastomal hernia of the left lower quadrant containing nonobstructed loops of transverse colon and mid small bowel.   07/20/2019 Imaging   CT of the abdomen and pelvis shows stable 11 mm well-circumscribed left adrenal lesion in the body of the left adrenal.  Small cyst in the lower pole of the right kidney.  Signs of prior bowel resection of the small bowel and colon with anastomotic sites.  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  No adenopathy in the retroperitoneum or upper abdomen.  Right active adnexa measures 2 x 1.4 cm compared to 1.9 x 1.4 cm on last imaging.  Left adnexa measures 1.6 x 1.6 cm.  Uterus grossly normal on CT.  No pelvic adenopathy.  Ventral suprapubic hernia containing small bowel.  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   08/15/2019 Surgery   Robotic-assisted laparoscopic total hysterectomy with bilateral salpingoophorectomy, infracolic omentectomy Lysis of adhesions and hernia reduction (Dr. Rosendo Gros), repair of abdominal wall hernia (Dr. Rosendo Gros)  On EUA, small mobile uterus. On intra-abdominal entry, normal appearing liver edge, diaphragm, and stomach. Omentum adherent to the anterior abdominal wall along patient's prior midline incision. Transverse colon hernia through prior ostomy site. Otherwise, some adhesions of the sigmoid to the left sidewall. Uterus 6cm and normal appearing. Right adnexa normal in appearance. Left adnexa with adhesions to the sigmoid colon and adherent to the medial aspect of the broad ligament. Left ovary with evidence of treatment effect. Several small (<22m) nodules on  the peritoneum within the cul de sac c/w treated tumor implants (cauterized). Omentum without obvious evidence of tumor  burden.   08/15/2019 Pathology Results   A. UTERUS, BILATERAL TUBES AND OVARIES, HYSTERECTOMY AND  BILATERALSALPINGO- OOPHORECTOMY:   Ovaries, bilateral:  -  Residual carcinoma status post neoadjuvant therapy involving  bilateral ovaries  -  See oncology table and comment below   Fallopian tubes, bilateral:  -  Benign fallopian tubes  -  No malignancy identified   Uterus:  -  Benign endometrial polyp  -  Leiomyoma (1.1 cm)  -  Serosa with focal calcifications  -  No hyperplasia or malignancy identified   Cervix:  -  Benign cervix with nabothian cysts  -  No dysplasia or malignancy identified   B. OMENTUM, RESECTION:  -  Microscopic focus of metastatic adenocarcinoma (< 92m)   ONCOLOGY TABLE:   OVARY or FALLOPIAN TUBE or PRIMARY PERITONEUM:   Procedure: Hysterectomy, bilateral salpingo-oophorectomy and omentectomy  Specimen Integrity: Intact  Tumor Site: Ovary  Ovarian Surface Involvement: Present  Fallopian Tube Surface Involvement: Not identified  Tumor Size: 0.8 cm  Histologic Type: Carcinoma, subtype cannot be determined; See comment  Histologic Grade: High-grade; see comment  Implants: Not identified  Other Tissue/ Organ Involvement: Not identified  Largest Extrapelvic Peritoneal Focus: < 1 mm  Peritoneal/Ascitic Fluid: Not applicable  Treatment Effect: Marked response with no or minimal residual cancer  (CRS 3)  Regional Lymph Nodes: No lymph nodes submitted or found  Pathologic Stage Classification (pTNM, AJCC 8th Edition): ypT3a, ypNX  Representative Tumor Block: A9  Comment(s): This case is being staged as if it were a high-grade serous  carcinoma.  Given that the morphologic and histologic assessment is  being rendered status post neoadjuvant therapy, the diagnostic features  of serous carcinoma are not readily apparent.  In fact, there are  scattered well-formed glandular elements within the foci of tumor. There  are other areas with high-grade nuclei  and scattered psammomatous  calcifications.  Residual tumor is present in both the right and left  ovaries with surface involvement.  The tumor foci in the right ovary  measures greater than 2 mm in maximum size.  However, the focus of  metastatic carcinoma in the omentum shows only a single focus which  measures less than 1 mm.  Based on this assessment, CRS 3 is assigned to  this case.      Interval History: Patient reports that overall she is been feeling well since surgery.  She endorses a good appetite without any nausea or emesis.  She reports using MiraLAX as needed for constipation.  She denies any vaginal bleeding or discharge.  She reports what sounds like mostly overflow urinary incontinence that she feels is most related to timing of when she takes her Lasix.  She is unsure whether this is worsened at all since surgery.  She denies any fevers or chills.  She saw Dr. RRosendo Groslast week.  She has a bulge on the left side of her abdomen near her incision but they are watching closely.  Her daughter was somewhat concerned because this was initially warm to the touch, but that has resolved.  She denies any pain around this bulge except when getting up to move.  The patient is scheduled to see Dr. LBobby Rumpfin August.  Past Medical/Surgical History: Past Medical History:  Diagnosis Date  . Abdominal hernia   . [place] 03/25/2019  . Anxiety   .  Anxiety   . Atrial fibrillation ([place]) 03/24/2019  . Chest pressure 09/18/2015  . Community acquired bilateral lower lobe pneumonia 03/24/2019  . Complication of anesthesia   . COPD (chronic obstructive pulmonary disease) ([place]) 03/27/2019  . Diverticulitis   . Essential hypertension 09/18/2015  . Family history of breast cancer   . Family history of colon cancer   . Family history of melanoma   . Family history of prostate cancer   . GAD (generalized anxiety disorder) 12/01/2015  . Glaucoma   .  Hypercholesteremia   . Hypertension   . Hyperthyroidism   . Hypokalemia 09/18/2015  . Ovarian cancer ([place])   . Parkinson disease ([place]) 12/01/2015  . Primary insomnia 12/01/2015  . Skin cancer    squamous cell  . Weakness 09/18/2015    Past Surgical History:  Procedure Laterality Date  . BREAST LUMPECTOMY    . INCISIONAL HERNIA REPAIR N/A 08/15/2019   Procedure: HERNIA REPAIR INCISIONAL WITH MESH;  Surgeon: [name], MD;  Location: [facility];  Service: General;  Laterality: N/A;  . LAPAROTOMY N/A 08/15/2019   Procedure: EXPLORATORY LAPAROTOMY;  Surgeon: [name], MD;  Location: [facility];  Service: General;  Laterality: N/A;  REQUESTING RNFA  . LYSIS OF ADHESION N/A 08/15/2019   Procedure: LYSIS OF ADHESION;  Surgeon: [name], MD;  Location: [facility];  Service: General;  Laterality: N/A;  . PARTIAL COLECTOMY     had partial colectomy for diverticular disease, required an ostomy followed by stoma revision  . right hand surgery    . squamous cell skin cancer resections     3    Family History  Problem Relation Age of Onset  . Stroke Mother   . Heart attack Father   . Skin cancer Father        non-melanoma; dx. in his late 40s  . Breast cancer Sister 36  . Prostate cancer Brother 50       metastatic  . Colon cancer Brother 4  . Melanoma Brother   . Breast cancer Sister 58  . Breast cancer Niece 52  . Colon cancer Paternal Aunt        dx. in her 31s or 60s  . Cancer Paternal Uncle        mole on back?, dx. in his 32s  . Breast cancer Other 15       paternal great-aunt (grandmother's sister)  . Melanoma Brother 39  . Colon cancer Paternal Aunt        dx. older than 58  . Throat cancer Paternal Aunt        dx. in her 21s  . Breast cancer Cousin        dx. in her 9s or 46s (maternal first cousin)  . Breast cancer Cousin        dx. in her 11s or 15s (maternal first cousin)  . Breast cancer Cousin        dx. in her 53s (maternal first cousin)    Social  History   Socioeconomic History  . Marital status: Married    Spouse name: Not on file  . Number of children: Not on file  . Years of education: Not on file  . Highest education level: Not on file  Occupational History  . Not on file  Tobacco Use  . Smoking status: Former Research scientist (life sciences)  . Smokeless tobacco: Never Used  . Tobacco comment: Quit 29 years ago  Vaping Use  . Vaping Use: Never used  Substance and Sexual Activity  . Alcohol use: Never  . Drug use: Never  . Sexual activity: Not on file  Other Topics Concern  . Not on file  Social History Narrative  . Not on file   Social Determinants of Health   Financial Resource Strain:   . Difficulty of Paying Living Expenses:   Food Insecurity:   . Worried About Charity fundraiser in the Last Year:   . Arboriculturist in the Last Year:   Transportation Needs:   . Film/video editor (Medical):   Marland Kitchen Lack of Transportation (Non-Medical):   Physical Activity:   . Days of Exercise per Week:   . Minutes of Exercise per Session:   Stress:   . Feeling of Stress :   Social Connections:   . Frequency of Communication with Friends and Family:   . Frequency of Social Gatherings with Friends and Family:   . Attends Religious Services:   . Active Member of Clubs or Organizations:   . Attends Archivist Meetings:   Marland Kitchen Marital Status:     Current Medications:  Current Outpatient Medications:  .  aspirin 81 MG EC tablet, Take by mouth., Disp: , Rfl:  .  carvedilol (COREG) 25 MG tablet, Take 25 mg by mouth 2 (two) times daily., Disp: , Rfl:  .  diltiazem (CARDIZEM CD) 120 MG 24 hr capsule, Take 120 mg by mouth daily., Disp: , Rfl:  .  DULoxetine (CYMBALTA) 20 MG capsule, Take 40 mg by mouth daily., Disp: , Rfl:  .  furosemide (LASIX) 40 MG tablet, Take 40 mg by mouth daily., Disp: , Rfl:  .  ibuprofen (ADVIL) 600 MG tablet, Take 1 tablet (600 mg total) by mouth every 8 (eight) hours as needed for moderate pain. For AFTER  surgery only, Disp: 15 tablet, Rfl: 0 .  methimazole (TAPAZOLE) 5 MG tablet, Take 5 mg by mouth daily. , Disp: , Rfl:  .  potassium chloride SA (KLOR-CON M20) 20 MEQ tablet, Take 20 mEq by mouth daily., Disp: , Rfl:  .  rosuvastatin (CRESTOR) 10 MG tablet, Take 10 mg by mouth every Monday. , Disp: , Rfl:  .  Travoprost, BAK Free, (TRAVATAN) 0.004 % SOLN ophthalmic solution, Place 1 drop into both eyes at bedtime. , Disp: , Rfl:   Review of Systems: Pertinent positives as per HPI. Denies appetite changes, fevers, chills, fatigue, unexplained weight changes. Denies hearing loss, neck lumps or masses, mouth sores, ringing in ears or voice changes. Denies cough or wheezing.  Denies shortness of breath. Denies chest pain or palpitations. Denies leg swelling. Denies abdominal distention, pain, blood in stools, diarrhea, nausea, vomiting, or early satiety. Denies pain with intercourse, dysuria, frequency, hematuria. Denies hot flashes, pelvic pain, vaginal bleeding or vaginal discharge.   Denies joint pain, back pain or muscle pain/cramps. Denies itching, rash, or wounds. Denies dizziness, headaches, numbness or seizures. Denies swollen lymph nodes or glands, denies easy bruising or bleeding. Denies anxiety, depression, confusion, or decreased concentration.  Physical Exam: BP (!) 126/56 (BP Location: Left Arm, Patient Position: Sitting)   Pulse 61   Temp 99.1 F (37.3 C) (Temporal)   Resp 16   Ht _0  (1.575 m)   Wt 142 lb 3.2 oz (64.5 kg)   SpO2 98%   BMI 26.01 kg/m  General: Alert, oriented, no acute distress. HEENT: Normocephalic, atraumatic, sclera anicteric. Chest: Unlabored breathing on room air. Abdomen: soft, nontender.  Normoactive bowel sounds.  There is approximately an 8 x 10 cm mass to the left of the patient's midline laparotomy incision that is mildly fluctuant and tense, suspected to be a hematoma or seroma.  There is no erythema or warmth to the touch.  Well-healed  laparoscopic incisions.   Extremities: Grossly normal range of motion.  Warm, well perfused.  Trace edema bilaterally. GU: Normal appearing external genitalia without erythema, excoriation, or lesions.  Speculum exam reveals vaginal cuff intact, mucosa moderately atrophic.  Some suture still visible.  No bleeding or discharge.  Bimanual exam reveals cuff intact, no nodularity or fluctuance.  Laboratory & Radiologic Studies: None new  Assessment & Plan: Megan Waller is a 79 y.o. 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  Overall the patient seems to be doing well postoperatively.  She will see Dr. Rosendo Gros again for what is likely to be a seroma or hematoma that developed postoperatively from her hernia repair.  We discussed continued activity restrictions and expectations from my standpoint.  I reviewed again her pathology with the patient and her daughter.  Overall, she had an excellent response to neoadjuvant chemotherapy.Based on data from the PRIMA study, in patients with advanced ovarian cancer who are responsive to platinum therapy, even in the setting of homologous recombination proficiency and no BRCA mutation, there was some progression free survival benefit.  I reached out to the patient's medical oncologist, Dr. Bobby Rumpf, and have recommended consideration of PARP inhibitor maintenance.  I had previously recommended genetic testing.  After counseling the patient again during her hospital stay, the patient agreed to speak with our genetic counselor.  She is planning to get her blood drawn today.  In terms of follow-up, we discussed surveillance visits every 3 months until the patient's is 2 years out from completion of treatment.   At that time, if she remains disease-free, we can transition to visits every 4-6 months.  This note will be sent to the patient's medical oncologist and I am happy to alternate visits with him.  20 minutes of total time was spent for this patient encounter, including preparation, face-to-face counseling with the patient and coordination of care, and documentation of the encounter.  Jeral Pinch, MD  Division of Gynecologic Oncology  Department of Obstetrics and Gynecology  [facility]

## 2019-09-24 DIAGNOSIS — F339 Major depressive disorder, recurrent, unspecified: Secondary | ICD-10-CM | POA: Diagnosis not present

## 2019-09-24 DIAGNOSIS — E0590 Thyrotoxicosis, unspecified without thyrotoxic crisis or storm: Secondary | ICD-10-CM | POA: Diagnosis not present

## 2019-09-24 DIAGNOSIS — I10 Essential (primary) hypertension: Secondary | ICD-10-CM | POA: Diagnosis not present

## 2019-09-24 DIAGNOSIS — E876 Hypokalemia: Secondary | ICD-10-CM | POA: Diagnosis not present

## 2019-09-25 DIAGNOSIS — Z1331 Encounter for screening for depression: Secondary | ICD-10-CM | POA: Diagnosis not present

## 2019-09-25 DIAGNOSIS — F339 Major depressive disorder, recurrent, unspecified: Secondary | ICD-10-CM | POA: Diagnosis not present

## 2019-09-25 DIAGNOSIS — J449 Chronic obstructive pulmonary disease, unspecified: Secondary | ICD-10-CM | POA: Diagnosis not present

## 2019-09-25 DIAGNOSIS — Z0000 Encounter for general adult medical examination without abnormal findings: Secondary | ICD-10-CM | POA: Diagnosis not present

## 2019-09-25 DIAGNOSIS — J9611 Chronic respiratory failure with hypoxia: Secondary | ICD-10-CM | POA: Diagnosis not present

## 2019-09-25 DIAGNOSIS — Z139 Encounter for screening, unspecified: Secondary | ICD-10-CM | POA: Diagnosis not present

## 2019-09-25 DIAGNOSIS — Z136 Encounter for screening for cardiovascular disorders: Secondary | ICD-10-CM | POA: Diagnosis not present

## 2019-09-25 DIAGNOSIS — Z1339 Encounter for screening examination for other mental health and behavioral disorders: Secondary | ICD-10-CM | POA: Diagnosis not present

## 2019-09-25 DIAGNOSIS — C792 Secondary malignant neoplasm of skin: Secondary | ICD-10-CM | POA: Diagnosis not present

## 2019-10-02 ENCOUNTER — Ambulatory Visit (INDEPENDENT_AMBULATORY_CARE_PROVIDER_SITE_OTHER): Payer: Medicare Other | Admitting: Cardiology

## 2019-10-02 ENCOUNTER — Other Ambulatory Visit: Payer: Self-pay

## 2019-10-02 ENCOUNTER — Encounter: Payer: Self-pay | Admitting: Cardiology

## 2019-10-02 VITALS — BP 140/72 | HR 68 | Ht 62.0 in | Wt 138.0 lb

## 2019-10-02 DIAGNOSIS — I10 Essential (primary) hypertension: Secondary | ICD-10-CM

## 2019-10-02 DIAGNOSIS — I480 Paroxysmal atrial fibrillation: Secondary | ICD-10-CM | POA: Insufficient documentation

## 2019-10-02 DIAGNOSIS — E782 Mixed hyperlipidemia: Secondary | ICD-10-CM | POA: Diagnosis not present

## 2019-10-02 NOTE — Patient Instructions (Signed)

## 2019-10-02 NOTE — Progress Notes (Signed)
Cardiology Office Note:    Date:  10/02/2019   ID:  Megan Waller, DOB [birth year], MRN [id number]  PCP:  Marco Collie, MD  Cardiologist:  Berniece Salines, DO  Electrophysiologist:  None   Referring MD: Marco Collie, MD   Chief Complaint  Patient presents with  . Follow-up    History of Present Illness:    Megan Imhoff Powersis a 79 y.o.femalewith a hx ofstage IV ovarian cancer status post chemotherapy then recurrences of cancer now she has received 1 of 3 cycles of carboplatin/paclitaxel chemotherapy,atrial fibrillationnot currently on anticoagulation takes aspirin daily, hypertension, hyperlipidemia, anxiety.   I last saw the patient on 06/08/2019 at time she she was planning surgery.  At that time she had not interested labs and therefore went home for her surgery.  She has completed 3 cycles of chemotherapy and is now ready for surgical procedure.  I saw the patient on August 01, 2019 at that time she was scheduled for atotal hysterectomy (removal of uterus or cervix) either robotically or open, bilateral salpingo-oophorectomy, omentectomy, tumor debulking, hernia repair with Dr. Rosendo Gros on June 22,2021.  She was cleared from a cardiovascular standpoint.  Patient is post surgery and is here today for follow-up visit.  But we have some sad news, she lost her husband on Saturday.  She is coping with this loss.  She offers no other complaints from a cardiovascular standpoint.   Past Medical History:  Diagnosis Date  . Abdominal hernia   . Acute on chronic respiratory failure with hypoxia ([place]) 03/25/2019  . Anxiety   . Anxiety   . Atrial fibrillation ([place]) 03/24/2019  . Chest pressure 09/18/2015  . Community acquired bilateral lower lobe pneumonia 03/24/2019  . Complication of anesthesia   . COPD (chronic obstructive pulmonary disease) ([place]) 03/27/2019  . Diverticulitis   . Essential hypertension 09/18/2015  . Family history of breast cancer   . Family history  of colon cancer   . Family history of melanoma   . Family history of prostate cancer   . GAD (generalized anxiety disorder) 12/01/2015  . Glaucoma   . Hypercholesteremia   . Hypertension   . Hyperthyroidism   . Hypokalemia 09/18/2015  . Ovarian cancer ([place])   . Parkinson disease ([place]) 12/01/2015  . Primary insomnia 12/01/2015  . Skin cancer    squamous cell  . Weakness 09/18/2015    Past Surgical History:  Procedure Laterality Date  . BREAST LUMPECTOMY    . INCISIONAL HERNIA REPAIR N/A 08/15/2019   Procedure: HERNIA REPAIR INCISIONAL WITH MESH;  Surgeon: [name], MD;  Location: [facility];  Service: General;  Laterality: N/A;  . LAPAROTOMY N/A 08/15/2019   Procedure: EXPLORATORY LAPAROTOMY;  Surgeon: [name], MD;  Location: [facility];  Service: General;  Laterality: N/A;  REQUESTING RNFA  . LYSIS OF ADHESION N/A 08/15/2019   Procedure: LYSIS OF ADHESION;  Surgeon: [name], MD;  Location: [facility];  Service: General;  Laterality: N/A;  . PARTIAL COLECTOMY     had partial colectomy for diverticular disease, required an ostomy followed by stoma revision  . right hand surgery    . squamous cell skin cancer resections     3    Current Medications: Current Meds  Medication Sig  . aspirin 81 MG EC tablet Take by mouth.  . carvedilol (COREG) 25 MG tablet Take 25 mg by mouth 2 (two) times daily.  Marland Kitchen diltiazem (CARDIZEM CD) 120 MG 24 hr capsule Take 120 mg by  mouth daily.  . DULoxetine (CYMBALTA) 20 MG capsule Take 40 mg by mouth daily.  . furosemide (LASIX) 40 MG tablet Take 40 mg by mouth daily.  . methimazole (TAPAZOLE) 5 MG tablet Take 5 mg by mouth daily.   . potassium chloride SA (KLOR-CON M20) 20 MEQ tablet Take 20 mEq by mouth daily.  . rosuvastatin (CRESTOR) 10 MG tablet Take 10 mg by mouth every Monday.   . Travoprost, BAK Free, (TRAVATAN) 0.004 % SOLN ophthalmic solution Place 1 drop into both eyes at bedtime.   . [DISCONTINUED] ibuprofen (ADVIL) 600 MG tablet  Take 1 tablet (600 mg total) by mouth every 8 (eight) hours as needed for moderate pain. For AFTER surgery only     Allergies:   Ezetimibe-simvastatin   Social History   Socioeconomic History  . Marital status: Married    Spouse name: Not on file  . Number of children: Not on file  . Years of education: Not on file  . Highest education level: Not on file  Occupational History  . Not on file  Tobacco Use  . Smoking status: Former Research scientist (life sciences)  . Smokeless tobacco: Never Used  . Tobacco comment: Quit 29 years ago  Vaping Use  . Vaping Use: Never used  Substance and Sexual Activity  . Alcohol use: Never  . Drug use: Never  . Sexual activity: Not on file  Other Topics Concern  . Not on file  Social History Narrative  . Not on file   Social Determinants of Health   Financial Resource Strain:   . Difficulty of Paying Living Expenses:   Food Insecurity:   . Worried About Charity fundraiser in the Last Year:   . Arboriculturist in the Last Year:   Transportation Needs:   . Film/video editor (Medical):   Marland Kitchen Lack of Transportation (Non-Medical):   Physical Activity:   . Days of Exercise per Week:   . Minutes of Exercise per Session:   Stress:   . Feeling of Stress :   Social Connections:   . Frequency of Communication with Friends and Family:   . Frequency of Social Gatherings with Friends and Family:   . Attends Religious Services:   . Active Member of Clubs or Organizations:   . Attends Archivist Meetings:   Marland Kitchen Marital Status:      Family History: The patient's family history includes Breast cancer in her cousin, cousin, and cousin; Breast cancer (age of onset: 77) in her sister and sister; Breast cancer (age of onset: 55) in her niece; Breast cancer (age of onset: 65) in an other family member; Cancer in her paternal uncle; Colon cancer in her paternal aunt and paternal aunt; Colon cancer (age of onset: 51) in her brother; Heart attack in her father; Melanoma  in her brother; Melanoma (age of onset: 48) in her brother; Prostate cancer (age of onset: 64) in her brother; Skin cancer in her father; Stroke in her mother; Throat cancer in her paternal aunt.  ROS:   Review of Systems  Constitution: Negative for decreased appetite, fever and weight gain.  HENT: Negative for congestion, ear discharge, hoarse voice and sore throat.   Eyes: Negative for discharge, redness, vision loss in right eye and visual halos.  Cardiovascular: Negative for chest pain, dyspnea on exertion, leg swelling, orthopnea and palpitations.  Respiratory: Negative for cough, hemoptysis, shortness of breath and snoring.   Endocrine: Negative for heat intolerance and polyphagia.  Hematologic/Lymphatic:  Negative for bleeding problem. Does not bruise/bleed easily.  Skin: Negative for flushing, nail changes, rash and suspicious lesions.  Musculoskeletal: Negative for arthritis, joint pain, muscle cramps, myalgias, neck pain and stiffness.  Gastrointestinal: Negative for abdominal pain, bowel incontinence, diarrhea and excessive appetite.  Genitourinary: Negative for decreased libido, genital sores and incomplete emptying.  Neurological: Negative for brief paralysis, focal weakness, headaches and loss of balance.  Psychiatric/Behavioral: Negative for altered mental status, depression and suicidal ideas.  Allergic/Immunologic: Negative for HIV exposure and persistent infections.    EKGs/Labs/Other Studies Reviewed:    The following studies were reviewed today:   EKG: None today  Pharmacologic stress testperformed in 2017 via [facility]where: No reversible ischemia or infarction. No left ventricular wall motion. Left ventricle ejection fraction 82%. Normal study.   [facility] performed at [facility] 1/2021FINDINGS Procedure:2D images, m-mode, color and spectral Doppler were obtained and reviewed. ECG rhythm:Sinus rhythm. Study quality:This was a technically adequate study  to a technically difficult study. Left Ventricle:The left ventricular size is normal. There is mild concentric left ventricular hypertrophy. There is normal global left ventricular contractility. Overall left ventricular systolic function is normal with, an EF between 60 - 65 %. The diastolic filling pattern is normal for the age of the patient. No regional wall motion abnormalities were noted. Right Ventricle:The right ventricle is normal in size and function. The RV was not well visualized. Left Atrium:Left atrium is mildly dilated by volume. Right Atrium:The right atrium is normal in size and function. The right atrium was not well visualized. ASD/VSD:Interatrial and interventricular septum intact. Aortic Valve:The aortic valve is trileaflet, and appears structurally normal. No aortic stenosis or regurgitation. Mitral Valve:Normal appearing mitral valve. There is trace mitral regurgitation. Tricuspid Valve:The tricuspid valve appears structurally normal. Mild tricuspid regurgitation present. The right ventricular systolic pressure, as measured by Doppler, is 31 mmhg. Pulmonic Valve:The pulmonic valve is normal. Trace/mild (physiologic) pulmonic regurgitation. Aorta:The aortic root, ascending aorta and aortic arch appear normal. NLZ:JQBHAL inferior vena cava with normal inspiratory collapse. Pulmonary Veins:The flow patterns, measured by Doppler, appear normal. Pericardium:The pericardium is normal. There is no pericardial effusion.  CONCLUSIONS ----------- 1. There is mild concentric left ventricular hypertrophy. 2. There is normal global left ventricular contractility. 3. Overall left ventricular systolic function is normal with, an EF between 60 - 65 %. 4. The diastolic filling pattern is normal for the age of the patient. 5. No regional wall motion abnormalities were noted. 6. The right ventricle is normal in size and function. 7. Left atrium is mildly dilated  by volume. 8. The pericardium is normal.  [facility] 1. Interval reduction in size of bilateral ovarian masses, ovaries now measuring within normal limits, essentially complete resolution of previously seen extensive metastatic omental and peritoneal nodularity about the abdomen and pelvis, and resolution of bilateral inguinal lymphadenopathy. Findings are consistent with treatment response, without CT evidence of residual disease in the abdomen or pelvis. 2. Cholelithiasis. 3. Parastomal hernia of the left lower quadrant containing nonobstructed loops of transverse colon and mid small bowel. 4. Aortic Atherosclerosis (ICD10-I70.0).    Recent Labs: 08/11/2019: ALT 12 08/17/2019: BUN 13; Creatinine, Ser 0.69; Hemoglobin 9.0; Platelets 120; Potassium 3.5; Sodium 142  Recent Lipid Panel No results found for: CHOL, TRIG, HDL, CHOLHDL, VLDL, LDLCALC, LDLDIRECT  Physical Exam:    VS:  BP 140/72 (BP Location: Left Arm, Patient Position: Sitting, Cuff Size: Normal)   Pulse 68   Ht 5\' 2"  (1.575 m)  Wt 138 lb (62.6 kg)   SpO2 96%   BMI 25.24 kg/m     Wt Readings from Last 3 Encounters:  10/02/19 138 lb (62.6 kg)  09/15/19 142 lb 3.2 oz (64.5 kg)  08/15/19 142 lb (64.4 kg)     GEN: Well nourished, well developed in no acute distress HEENT: Normal NECK: No JVD; No carotid bruits LYMPHATICS: No lymphadenopathy CARDIAC: S1S2 noted,RRR, no murmurs, rubs, gallops RESPIRATORY:  Clear to auscultation without rales, wheezing or rhonchi  ABDOMEN: Soft, non-tender, non-distended, +bowel sounds, no guarding. EXTREMITIES: No edema, No cyanosis, no clubbing MUSCULOSKELETAL:  No deformity  SKIN: Warm and dry NEUROLOGIC:  Alert and oriented x 3, non-focal PSYCHIATRIC:  Normal affect, good insight  ASSESSMENT:    1. Essential hypertension   2. Mixed hyperlipidemia   3. PAF (paroxysmal atrial fibrillation) ([place])    PLAN:     She is doing well.   She did well postoperatively and have not had any problems from a cardiovascular standpoint.  I did express my sympathy to the patient for the loss of her husband.  At this time I will going to continue patient her current medication regimen.  Back on her aspirin since surgery.  She also had taken her Covid vaccine and has been doing very well.  The patient is in agreement with the above plan. The patient left the office in stable condition.  The patient will follow up in 6 months or sooner if needed.   Medication Adjustments/Labs and Tests Ordered: Current medicines are reviewed at length with the patient today.  Concerns regarding medicines are outlined above.  No orders of the defined types were placed in this encounter.  No orders of the defined types were placed in this encounter.   Patient Instructions  Medication Instructions:  Your physician recommends that you continue on your current medications as directed. Please refer to the Current Medication list given to you today.  *If you need a refill on your cardiac medications before your next appointment, please call your pharmacy*   Lab Work: None If you have labs (blood work) drawn today and your tests are completely normal, you will receive your results only by: Marland Kitchen MyChart Message (if you have MyChart) OR . A paper copy in the mail If you have any lab test that is abnormal or we need to change your treatment, we will call you to review the results.   Testing/Procedures: None   Follow-Up: At [facility], you and your health needs are our priority.  As part of our continuing mission to provide you with exceptional heart care, we have created designated Provider Care Teams.  These Care Teams include your primary Cardiologist (physician) and Advanced Practice Providers (APPs -  Physician Assistants and Nurse Practitioners) who all work together to provide you with the care you need, when you need it.  We recommend signing  up for the patient portal called "MyChart".  Sign up information is provided on this After Visit Summary.  MyChart is used to connect with patients for Virtual Visits (Telemedicine).  Patients are able to view lab/test results, encounter notes, upcoming appointments, etc.  Non-urgent messages can be sent to your provider as well.   To learn more about what you can do with MyChart, go to [email or web address].    Your next appointment:   6 month(s)  The format for your next appointment:   In Person  Provider:   Berniece Salines, DO   Other Instructions  Adopting a Healthy Lifestyle.  Know what a healthy weight is for you (roughly BMI <25) and aim to maintain this   Aim for 7+ servings of fruits and vegetables daily   65-80+ fluid ounces of water or unsweet tea for healthy kidneys   Limit to max 1 drink of alcohol per day; avoid smoking/tobacco   Limit animal fats in diet for cholesterol and heart health - choose grass fed whenever available   Avoid highly processed foods, and foods high in saturated/trans fats   Aim for low stress - take time to unwind and care for your mental health   Aim for 150 min of moderate intensity exercise weekly for heart health, and weights twice weekly for bone health   Aim for 7-9 hours of sleep daily   When it comes to diets, agreement about the perfect plan isnt easy to find, even among the experts. Experts at the [employer] developed an idea known as the Healthy Eating Plate. Just imagine a plate divided into logical, healthy portions.   The emphasis is on diet quality:   Load up on vegetables and fruits - one-half of your plate: Aim for color and variety, and remember that potatoes dont count.   Go for whole grains - one-quarter of your plate: Whole wheat, barley, wheat berries, quinoa, oats, brown rice, and foods made with them. If you want pasta, go with whole wheat pasta.   Protein power - one-quarter of your  plate: Fish, chicken, beans, and nuts are all healthy, versatile protein sources. Limit red meat.   The diet, however, does go beyond the plate, offering a few other suggestions.   Use healthy plant oils, such as olive, canola, soy, corn, sunflower and peanut. Check the labels, and avoid partially hydrogenated oil, which have unhealthy trans fats.   If youre thirsty, drink water. Coffee and tea are good in moderation, but skip sugary drinks and limit milk and dairy products to one or two daily servings.   The type of carbohydrate in the diet is more important than the amount. Some sources of carbohydrates, such as vegetables, fruits, whole grains, and beans-are healthier than others.   Finally, stay active  Signed, Berniece Salines, DO  10/02/2019 9:57 AM    [place]

## 2019-10-10 DIAGNOSIS — C569 Malignant neoplasm of unspecified ovary: Secondary | ICD-10-CM | POA: Diagnosis not present

## 2019-10-10 DIAGNOSIS — R971 Elevated cancer antigen 125 [CA 125]: Secondary | ICD-10-CM | POA: Diagnosis not present

## 2019-10-10 DIAGNOSIS — C792 Secondary malignant neoplasm of skin: Secondary | ICD-10-CM | POA: Diagnosis not present

## 2019-10-20 DIAGNOSIS — C569 Malignant neoplasm of unspecified ovary: Secondary | ICD-10-CM | POA: Diagnosis not present

## 2019-10-20 DIAGNOSIS — D508 Other iron deficiency anemias: Secondary | ICD-10-CM | POA: Diagnosis not present

## 2019-10-24 DIAGNOSIS — F339 Major depressive disorder, recurrent, unspecified: Secondary | ICD-10-CM | POA: Diagnosis not present

## 2019-10-24 DIAGNOSIS — E0590 Thyrotoxicosis, unspecified without thyrotoxic crisis or storm: Secondary | ICD-10-CM | POA: Diagnosis not present

## 2019-10-24 DIAGNOSIS — E876 Hypokalemia: Secondary | ICD-10-CM | POA: Diagnosis not present

## 2019-10-24 DIAGNOSIS — I10 Essential (primary) hypertension: Secondary | ICD-10-CM | POA: Diagnosis not present

## 2019-10-25 DIAGNOSIS — D509 Iron deficiency anemia, unspecified: Secondary | ICD-10-CM | POA: Diagnosis not present

## 2019-10-27 ENCOUNTER — Ambulatory Visit: Payer: Self-pay | Admitting: Genetic Counselor

## 2019-10-27 ENCOUNTER — Encounter: Payer: Self-pay | Admitting: Genetic Counselor

## 2019-10-27 ENCOUNTER — Telehealth: Payer: Self-pay | Admitting: Genetic Counselor

## 2019-10-27 ENCOUNTER — Other Ambulatory Visit (HOSPITAL_COMMUNITY): Payer: Self-pay | Admitting: General Surgery

## 2019-10-27 DIAGNOSIS — Z1379 Encounter for other screening for genetic and chromosomal anomalies: Secondary | ICD-10-CM | POA: Insufficient documentation

## 2019-10-27 DIAGNOSIS — S301XXA Contusion of abdominal wall, initial encounter: Secondary | ICD-10-CM

## 2019-10-27 NOTE — Progress Notes (Signed)
HPI:  Ms. Sistare was previously seen in the [facility] due to a personal and family history of cancer and concerns regarding a hereditary predisposition to cancer. Please refer to our prior cancer genetics clinic note for more information regarding our discussion, assessment and recommendations, at the time. Ms. Fasching recent genetic test results were disclosed to her, as were recommendations warranted by these results. These results and recommendations are discussed in more detail below.  CANCER HISTORY:  Oncology History Overview Note  Patient presented in December with an abdominal nodule over her left medial abdomen that grew progressively over time.  She was seen by dermatology for this lesion and underwent a biopsy.  Ca-125: 1/13: 180 2/10: 96.9 3/16: 12.1 4/5: 9.7   Ovarian cancer ([place])  02/14/2019 Initial Biopsy   Left lower abdominal skin biopsy, metastatic adenocarcinoma.  Based on histologic and IHC findings, a gynecologic primary is favored.   02/14/2019 Initial Diagnosis   Ovarian cancer ([place])   03/06/2019 Imaging   CT chest, abdomen, pelvis: Widespread peritoneal carcinomatosis with bilateral adnexal masses, bilateral external iliac adenopathy and a subcutaneous mass in the left periumbilical region, likely a soft tissue metastasis.  No evidence of thoracic metastatic disease.  Enlarging complex cystic and solid left thyroid nodule.  Large recurrent left anterior abdominal wall hernia containing portion of the transverse colon without evidence of incarceration or obstruction.  Cholelithiasis and aortic atherosclerosis.   03/15/2019 -  Chemotherapy   Carboplatin/taxol Cycle 1: 1/20 Cycle 2: 2/23 (delayed secondary to side effects), 25% dose reduction Cycle 3: 3/16  Cycle 5: 4/29; continued Neulasta to prevent severe neutropenia from delaying cycles of tx.     03/17/2019 Imaging   CT chest: No pulmonary embolus noted.  Small pleural effusions with  bibasilar atelectasis.  No adenopathy.  Dominant left lobe thyroid mass, stable from prior CT.   05/29/2019 Imaging   CT abdomen and pelvis: Interval reduction in size of bilateral ovarian masses, varies now measuring within normal limits (2 x 1.4 cm on the right and 1.7 x 1.4 cm on the left), essentially complete resolution of previously seen extensive metastatic omental and peritoneal nodularity about the abdomen and pelvis.  Resolution of bilateral inguinal lymphadenopathy. Parastomal hernia of the left lower quadrant containing nonobstructed loops of transverse colon and mid small bowel.   07/20/2019 Imaging   CT of the abdomen and pelvis shows stable 11 mm well-circumscribed left adrenal lesion in the body of the left adrenal.  Small cyst in the lower pole of the right kidney.  Signs of prior bowel resection of the small bowel and colon with anastomotic sites.  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  No adenopathy in the retroperitoneum or upper abdomen.  Right active adnexa measures 2 x 1.4 cm compared to 1.9 x 1.4 cm on last imaging.  Left adnexa measures 1.6 x 1.6 cm.  Uterus grossly normal on CT.  No pelvic adenopathy.  Ventral suprapubic hernia containing small bowel.  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   08/15/2019 Surgery   Robotic-assisted laparoscopic total hysterectomy with bilateral salpingoophorectomy, infracolic omentectomy Lysis of adhesions and hernia reduction (Dr. Rosendo Gros), repair of abdominal wall hernia (Dr. Rosendo Gros)  On EUA, small mobile uterus. On intra-abdominal entry, normal appearing liver edge, diaphragm, and stomach. Omentum adherent to the anterior  abdominal wall along patient's prior midline incision. Transverse colon hernia through prior ostomy site. Otherwise, some adhesions of the sigmoid  to the left sidewall. Uterus 6cm and normal appearing. Right adnexa normal in appearance. Left adnexa with adhesions to the sigmoid colon and adherent to the medial aspect of the broad ligament. Left ovary with evidence of treatment effect. Several small (<85m) nodules on the peritoneum within the cul de sac c/w treated tumor implants (cauterized). Omentum without obvious evidence of tumor burden.   08/15/2019 Pathology Results   A. UTERUS, BILATERAL TUBES AND OVARIES, HYSTERECTOMY AND  BILATERALSALPINGO- OOPHORECTOMY:   Ovaries, bilateral:  -  Residual carcinoma status post neoadjuvant therapy involving  bilateral ovaries  -  See oncology table and comment below   Fallopian tubes, bilateral:  -  Benign fallopian tubes  -  No malignancy identified   Uterus:  -  Benign endometrial polyp  -  Leiomyoma (1.1 cm)  -  Serosa with focal calcifications  -  No hyperplasia or malignancy identified   Cervix:  -  Benign cervix with nabothian cysts  -  No dysplasia or malignancy identified   B. OMENTUM, RESECTION:  -  Microscopic focus of metastatic adenocarcinoma (< 169m   ONCOLOGY TABLE:   OVARY or FALLOPIAN TUBE or PRIMARY PERITONEUM:   Procedure: Hysterectomy, bilateral salpingo-oophorectomy and omentectomy  Specimen Integrity: Intact  Tumor Site: Ovary  Ovarian Surface Involvement: Present  Fallopian Tube Surface Involvement: Not identified  Tumor Size: 0.8 cm  Histologic Type: Carcinoma, subtype cannot be determined; See comment  Histologic Grade: High-grade; see comment  Implants: Not identified  Other Tissue/ Organ Involvement: Not identified  Largest Extrapelvic Peritoneal Focus: < 1 mm  Peritoneal/Ascitic Fluid: Not applicable  Treatment Effect: Marked response with no or minimal residual cancer  (CRS 3)  Regional Lymph Nodes: No lymph nodes submitted or found  Pathologic Stage Classification (pTNM, AJCC 8th Edition): ypT3a, ypNX  Representative Tumor Block: A9    Comment(s): This case is being staged as if it were a high-grade serous  carcinoma.  Given that the morphologic and histologic assessment is  being rendered status post neoadjuvant therapy, the diagnostic features  of serous carcinoma are not readily apparent.  In fact, there are  scattered well-formed glandular elements within the foci of tumor. There  are other areas with high-grade nuclei and scattered psammomatous  calcifications.  Residual tumor is present in both the right and left  ovaries with surface involvement.  The tumor foci in the right ovary  measures greater than 2 mm in maximum size.  However, the focus of  metastatic carcinoma in the omentum shows only a single focus which  measures less than 1 mm.  Based on this assessment, CRS 3 is assigned to  this case.    10/25/2019 Genetic Testing   Negative genetic testing:  No pathogenic variants detected on the [facility] + RNAinsight panel. The report date is 10/25/2019.   The CancerNext gene panel offered by [employer] sequencing and rearrangement analysis for the following 36 genes:   APC*, ATM*, AXIN2, BARD1, BMPR1A, BRCA1*, BRCA2*, BRIP1*, CDH1*, CDK4, CDKN2A, CHEK2*, DICER1, HOXB13, EPCAM, GREM1, MLH1*, MSH2*, MSH3, MSH6*, MUTYH*, NBN, NF1*, NTHL1, PALB2*, PMS2*, POLD1, POLE, PTEN*, RAD51C*, RAD51D*, RECQL, SMAD4, SMARCA4, STK11, and TP53*. DNA and RNA analyses performed for * genes.     FAMILY HISTORY:  We obtained a detailed, 4-generation family history.  Significant diagnoses are listed below: Family History  Problem Relation Age of Onset  .  Stroke Mother   . Heart attack Father   . Skin cancer Father        non-melanoma; dx. in his late 49s  . Breast cancer Sister 10  . Prostate cancer Brother 80       metastatic  . Colon cancer Brother 32  . Melanoma Brother   . Breast cancer Sister 29  . Breast cancer Niece 83  . Colon cancer Paternal Aunt        dx. in her 69s or 69s  . Cancer Paternal  Uncle        mole on back?, dx. in his 84s  . Breast cancer Other 109       paternal great-aunt (grandmother's sister)  . Melanoma Brother 63  . Colon cancer Paternal Aunt        dx. older than 14  . Throat cancer Paternal Aunt        dx. in her 98s  . Breast cancer Cousin        dx. in her 52s or 60s (maternal first cousin)  . Breast cancer Cousin        dx. in her 91s or 26s (maternal first cousin)  . Breast cancer Cousin        dx. in her 90s (maternal first cousin)   Ms. Nieves has two sons (ages 58 and 6) and one daughter (age 4). None of her children have had cancer. She has six brothers and five sisters. Two of her sisters had breast cancer diagnosed at the age of 43. One brother was diagnosed with colon cancer at the age of 11 and has also had melanoma. Another brother died from prostate cancer that had metastasized to the brain, and a third brother was diagnosed with melanoma at the age of 86. Ms. Farnell also has a niece who was diagnosed with breast cancer at the age of 61.   Ms. Zylka mother died at the age of 37 and did not have cancer. She had three maternal uncles and four maternal aunts, none of whom had cancer. She has three maternal first cousins who have had breast cancer - two diagnosed in their 40s or 41s, and one diagnosed in her 74s. Her maternal grandmother died in her late 34s and did not have cancer. Her maternal grandfather died in his 12s and did not have cancer.  Ms. Altemose father died at the age of 31 and had a history of non-melanoma skin cancer diagnosed in his late 11s or early 73s. She notes that her father spent a lot of time in the sun gardening. She had two paternal uncles and four paternal aunts. Two aunts had colon cancer - one diagnosed in her 49s or 50s, and the other diagnosed older than 24. Another aunt had throat cancer diagnosed in her 50s. One uncle had cancer that started in a mole on his back in his 91s. Her paternal grandmother died when she  was older than 77 and did not have cancer, and her paternal grandfather died in his 67s from diptheria. She has a paternal great-aunt (grandmother's sister) who had breast cancer diagnosed at the age of 47.   Ms. Nine is unaware of previous family history of genetic testing for hereditary cancer risks. She does not know her ancestry. There is no reported Ashkenazi Jewish ancestry. There is no known consanguinity.  GENETIC TEST RESULTS: Genetic testing reported out on 10/25/2019 through the [facility] + RNAinsight panel. No pathogenic variants were detected.  The CancerNext gene panel offered by [employer] includes sequencing and rearrangement analysis for the following 36 genes:   APC*, ATM*, AXIN2, BARD1, BMPR1A, BRCA1*, BRCA2*, BRIP1*, CDH1*, CDK4, CDKN2A, CHEK2*, DICER1, HOXB13, EPCAM, GREM1, MLH1*, MSH2*, MSH3, MSH6*, MUTYH*, NBN, NF1*, NTHL1, PALB2*, PMS2*, POLD1, POLE, PTEN*, RAD51C*, RAD51D*, RECQL, SMAD4, SMARCA4, STK11, and TP53*. DNA and RNA analyses performed for * genes. The test report will be scanned into EPIC and located under the Molecular Pathology section of the Results Review tab.  A portion of the result report is included below for reference.     We discussed with Ms. Cecilio that because current genetic testing is not perfect, it is possible there may be a gene mutation in one of these genes that current testing cannot detect, but that chance is small.  We also discussed that there could be another gene that has not yet been discovered, or that we have not yet tested, that is responsible for the cancer diagnoses in the family. It is also possible there is a hereditary cause for the cancer in the family that Ms. Abadi did not inherit and therefore was not identified in her testing.  Therefore, it is important to remain in touch with cancer genetics in the future so that we can continue to offer Ms. Josephson the most up to date genetic testing.   Of note, Ms. Patel's  genetic testing was supposed to include both germline and somatic (tumor) testing. However, there was not enough tumor tissue to complete the somatic testing. Therefore, the genetic test only analyzed for germline (inherited) genetic mutations.  CANCER SCREENING RECOMMENDATIONS: Ms. Szwed test result is considered negative (normal).  This means that we have not identified a hereditary cause for her personal and family history of cancer at this time. While reassuring, this does not definitively rule out a hereditary predisposition to cancer. It is still possible that there could be genetic mutations that are undetectable by current technology. There could be genetic mutations in genes that have not been tested or identified to increase cancer risk.  Therefore, it is recommended she continue to follow the cancer management and screening guidelines provided by her oncology and primary healthcare providers.   An individual's cancer risk and medical management are not determined by genetic test results alone. Overall cancer risk assessment incorporates additional factors, including personal medical history, family history, and any available genetic information that may result in a personalized plan for cancer prevention and surveillance.  RECOMMENDATIONS FOR FAMILY MEMBERS:  Individuals in this family might be at some increased risk of developing cancer, over the general population risk, simply due to the family history of cancer.  We recommended women in this family have a yearly mammogram beginning at age 45, or 28 years younger than the earliest onset of cancer, an annual clinical breast exam, and perform monthly breast self-exams. Women in this family should also have a gynecological exam as recommended by their primary provider. All family members should be referred for colonoscopy starting at age 55.  It is also possible there is a hereditary cause for the cancer in Ms. Buelna's family that she did not  inherit and therefore was not identified in her.  Based on Ms. Jarboe's family history, we recommended her sister, who was diagnosed with breast cancer at age 73, as well as her brother, who was diagnosed with colon cancer at age 88, have genetic counseling and testing. Ms. Woodin will let us know if we can be of any  assistance in coordinating genetic counseling and/or testing for this family member.   FOLLOW-UP: Lastly, we discussed with Ms. Hemphill that cancer genetics is a rapidly advancing field and it is possible that new genetic tests will be appropriate for her and/or her family members in the future. We encouraged her to remain in contact with cancer genetics on an annual basis so we can update her personal and family histories and let her know of advances in cancer genetics that may benefit this family.   Our contact number was provided. Ms. Deschene questions were answered to her satisfaction, and she knows she is welcome to call us at anytime with additional questions or concerns.   Clint Guy, MS, [facility] Genetic Counselor [email or web address] Phone: [phone]

## 2019-10-27 NOTE — Telephone Encounter (Signed)
Revealed negative genetic testing. Discussed that we do not know why she has ovarian cancer or why there is cancer in the family. There could be a genetic mutation in the family that Ms. Coots did not inherit. There could also be a mutation in a different gene that we are not testing, or our current technology may not be able detect certain mutations. It will therefore be important for her to stay in contact with genetics to keep up with whether additional testing may be appropriate in the future.   Ms. Jacquet genetic testing was supposed to include both germline and somatic (tumor) testing. However, there was not enough tumor tissue to complete the somatic testing. Therefore, the genetic test only analyzed for germline (inherited) genetic mutations.

## 2019-10-27 NOTE — Telephone Encounter (Signed)
LVM that her genetic test results are available and requested that she call back to discuss them.  

## 2019-11-03 ENCOUNTER — Ambulatory Visit (HOSPITAL_COMMUNITY)
Admission: RE | Admit: 2019-11-03 | Discharge: 2019-11-03 | Disposition: A | Discharge status: Home / Self Care | Payer: Medicare Other | Source: Ambulatory Visit | Attending: General Surgery | Admitting: General Surgery

## 2019-11-03 DIAGNOSIS — S301XXA Contusion of abdominal wall, initial encounter: Secondary | ICD-10-CM | POA: Insufficient documentation

## 2019-11-03 DIAGNOSIS — R1900 Intra-abdominal and pelvic swelling, mass and lump, unspecified site: Secondary | ICD-10-CM | POA: Diagnosis not present

## 2019-11-06 ENCOUNTER — Encounter (HOSPITAL_COMMUNITY): Payer: Self-pay

## 2019-11-06 NOTE — Progress Notes (Signed)
BP     BP 1  Jhene J. Farooq Female, 79 y.o., [birth year] MRN:  [id number] Phone:  [phone] Jerilynn Mages) PCP:  Marco Collie, MD Primary Cvg:  Medicare/Medicare Part A And B Next Appt With Radiology ([facility]-US 2) 11/14/2019 at 1:00 PM  FW: CT Aspiration Received: Today Message Details  K[facility] Reap Previous Messages  ----- Message -----  From: Arne Cleveland, MD  Sent: 11/06/2019  2:32 PM EDT  To: Jillyn Hidden  Subject: RE: CT Aspiration                 Ok   [place] drain placement SQ collection  Complex: may be hematoma   DDH    ----- Message -----  From: Jillyn Hidden  Sent: 11/03/2019  4:35 PM EDT  To: Arne Cleveland, MD  Subject: FW: CT Aspiration                 US Abdomen has been done.  ----- Message -----  From: Arne Cleveland, MD  Sent: 11/01/2019 10:59 AM EDT  To: Jillyn Hidden  Subject: RE: CT Aspiration                 Ok  Please resubmit after [place] finalized.  Thx  DDH    ----- Message -----  From: Garth Bigness D  Sent: 11/01/2019 10:36 AM EDT  To: Arne Cleveland, MD  Subject: RE: CT Aspiration                 Good morning, I spoke with Joelene Millin from office and she stated that the imaging that we see is all patient has had done. Patient is scheduled for a US Abdomen on 11/03/2019, Provider wants to use that imaging once resulted if it shows anything.  ----- Message -----  From: Arne Cleveland, MD  Sent: 10/31/2019  3:28 PM EDT  To: [name], MD, Jillyn Hidden  Subject: FW: CT Aspiration                 I don't see any seroma on imaging from May 2021 in [place] or [place].  Is there something more recent?   Thanks  Arne Cleveland  IR   ----- Message -----  From: Garth Bigness D  Sent: 10/31/2019 12:23 PM EDT  To: Ir Procedure Requests  Subject: FW: CT Aspiration                 Still need review,  Shick looked at it on Friday but no response and I see that he is on Vacation this week.  ----- Message -----  From: Garth Bigness D  Sent: 10/27/2019  3:23 PM EDT  To: Ir Procedure Requests  Subject: CT Aspiration                   Procedure: CT Aspiration   Reason: Abdominal wall seroma, initial encounter, R/O abdominal seroma   History:   Outside imaging uploaded on 05/29/2019   Provider: [name]

## 2019-11-13 ENCOUNTER — Other Ambulatory Visit: Payer: Self-pay | Admitting: Student

## 2019-11-14 ENCOUNTER — Encounter (HOSPITAL_COMMUNITY): Payer: Self-pay

## 2019-11-14 ENCOUNTER — Ambulatory Visit (HOSPITAL_COMMUNITY)
Admission: RE | Admit: 2019-11-14 | Discharge: 2019-11-14 | Disposition: A | Discharge status: Home / Self Care | Payer: Medicare Other | Source: Ambulatory Visit | Attending: General Surgery | Admitting: General Surgery

## 2019-11-14 ENCOUNTER — Other Ambulatory Visit: Payer: Self-pay

## 2019-11-14 DIAGNOSIS — S301XXA Contusion of abdominal wall, initial encounter: Secondary | ICD-10-CM | POA: Diagnosis not present

## 2019-11-14 DIAGNOSIS — T8143XA Infection following a procedure, organ and space surgical site, initial encounter: Secondary | ICD-10-CM | POA: Diagnosis not present

## 2019-11-14 DIAGNOSIS — K91872 Postprocedural seroma of a digestive system organ or structure following a digestive system procedure: Secondary | ICD-10-CM | POA: Diagnosis not present

## 2019-11-14 DIAGNOSIS — C569 Malignant neoplasm of unspecified ovary: Secondary | ICD-10-CM | POA: Diagnosis not present

## 2019-11-14 MED ORDER — MIDAZOLAM HCL 2 MG/2ML IJ SOLN
INTRAMUSCULAR | Status: AC | PRN
  Administered 2019-11-14: 1 mg via INTRAVENOUS

## 2019-11-14 MED ORDER — FENTANYL CITRATE (PF) 100 MCG/2ML IJ SOLN
INTRAMUSCULAR | Status: AC
  Filled 2019-11-14: qty 2

## 2019-11-14 MED ORDER — FENTANYL CITRATE (PF) 100 MCG/2ML IJ SOLN
INTRAMUSCULAR | Status: AC | PRN
  Administered 2019-11-14: 50 ug via INTRAVENOUS

## 2019-11-14 MED ORDER — SODIUM CHLORIDE 0.9 % IV SOLN: INTRAVENOUS | Status: DC

## 2019-11-14 MED ORDER — LIDOCAINE HCL (PF) 1 % IJ SOLN
INTRAMUSCULAR | Status: AC
  Filled 2019-11-14: qty 30

## 2019-11-14 MED ORDER — MIDAZOLAM HCL 2 MG/2ML IJ SOLN
INTRAMUSCULAR | Status: AC
  Filled 2019-11-14: qty 2

## 2019-11-14 NOTE — H&P (Signed)
Chief Complaint: Patient was seen in consultation today for abdominal seroma aspiration/drain placement at the request of [place]  Referring Physician(s): Ramirez,Armando  Supervising Physician: Dr Ruthann Cancer  Patient Status: [name]  History of Present Illness: Megan Waller is a 79 y.o. female   Hx Ovarian Cancer Patient was admitted on 6/22 for post-op care after robotic interval tumor debulking for ovarian cancer and reduction and repair of ventral hernia.  Surgery ROBOTIC ASSISTED TOTAL HYSTERECTOMY BILATERAL SALPINGO OOPHORECTOMY WITH OMENTECTOMY AND DEBULKING. (Bilateral) EXPLORATORY LAPAROTOMY (N/A) - REQUESTING RNFA LYSIS OF ADHESION (N/A) HERNIA REPAIR INCISIONAL WITH MESH (N/A  Developed pain and abd distension Weeping area- left low abd Dr Rosendo Gros ordered imaging  [place] 11/03/19:  IMPRESSION: A 9.2 x 6.3 x 8.9 cm complex cystic mass with some internal vascularity noted the left lower quadrant in region of clinical concern. A process such as an abscess or hematoma could present this fashion. Tumor cannot be excluded.  Now scheduled for seroma aspiration/drain placement   Past Medical History:  Diagnosis Date  . Abdominal hernia   . Acute on chronic respiratory failure with hypoxia ([place]) 03/25/2019  . Anxiety   . Anxiety   . Atrial fibrillation ([place]) 03/24/2019  . Chest pressure 09/18/2015  . Community acquired bilateral lower lobe pneumonia 03/24/2019  . Complication of anesthesia   . COPD (chronic obstructive pulmonary disease) ([place]) 03/27/2019  . Diverticulitis   . Essential hypertension 09/18/2015  . Family history of breast cancer   . Family history of colon cancer   . Family history of melanoma   . Family history of prostate cancer   . GAD (generalized anxiety disorder) 12/01/2015  . Glaucoma   . Hypercholesteremia   . Hypertension   . Hyperthyroidism   . Hypokalemia 09/18/2015  . Ovarian cancer ([place])   . Parkinson disease  ([place]) 12/01/2015  . Primary insomnia 12/01/2015  . Skin cancer    squamous cell  . Weakness 09/18/2015    Past Surgical History:  Procedure Laterality Date  . BREAST LUMPECTOMY    . INCISIONAL HERNIA REPAIR N/A 08/15/2019   Procedure: HERNIA REPAIR INCISIONAL WITH MESH;  Surgeon: [name], MD;  Location: [facility];  Service: General;  Laterality: N/A;  . LAPAROTOMY N/A 08/15/2019   Procedure: EXPLORATORY LAPAROTOMY;  Surgeon: [name], MD;  Location: [facility];  Service: General;  Laterality: N/A;  REQUESTING RNFA  . LYSIS OF ADHESION N/A 08/15/2019   Procedure: LYSIS OF ADHESION;  Surgeon: [name], MD;  Location: [facility];  Service: General;  Laterality: N/A;  . PARTIAL COLECTOMY     had partial colectomy for diverticular disease, required an ostomy followed by stoma revision  . right hand surgery    . squamous cell skin cancer resections     3    Allergies: Ezetimibe-simvastatin  Medications: Prior to Admission medications   Medication Sig Start Date End Date Taking? Authorizing Provider  aspirin 81 MG EC tablet Take by mouth.   Yes [provider]  carvedilol (COREG) 25 MG tablet Take 25 mg by mouth 2 (two) times daily. 05/03/19  Yes [provider]  diltiazem (CARDIZEM CD) 120 MG 24 hr capsule Take 120 mg by mouth daily. 05/15/19  Yes [provider]  DULoxetine (CYMBALTA) 20 MG capsule Take 40 mg by mouth daily.   Yes [provider]  furosemide (LASIX) 40 MG tablet Take 40 mg by mouth daily. 05/15/19  Yes [provider]  methimazole (TAPAZOLE) 5  MG tablet Take 5 mg by mouth daily.  04/01/19  Yes [provider]  ondansetron (ZOFRAN) 4 MG tablet Take 4 mg by mouth every 8 (eight) hours as needed for nausea or vomiting.   Yes [provider]  rosuvastatin (CRESTOR) 10 MG tablet Take 10 mg by mouth every Monday.  02/08/19  Yes [provider]  Travoprost, BAK Free, (TRAVATAN) 0.004 % SOLN ophthalmic  solution Place 1 drop into both eyes at bedtime.  05/03/19  Yes [provider]  potassium chloride SA (KLOR-CON M20) 20 MEQ tablet Take 20 mEq by mouth daily.    [provider]     Family History  Problem Relation Age of Onset  . Stroke Mother   . Heart attack Father   . Skin cancer Father        non-melanoma; dx. in his late 16s  . Breast cancer Sister 62  . Prostate cancer Brother 37       metastatic  . Colon cancer Brother 82  . Melanoma Brother   . Breast cancer Sister 34  . Breast cancer Niece 63  . Colon cancer Paternal Aunt        dx. in her 21s or 40s  . Cancer Paternal Uncle        mole on back?, dx. in his 64s  . Breast cancer Other 105       paternal great-aunt (grandmother's sister)  . Melanoma Brother 39  . Colon cancer Paternal Aunt        dx. older than 32  . Throat cancer Paternal Aunt        dx. in her 63s  . Breast cancer Cousin        dx. in her 42s or 63s (maternal first cousin)  . Breast cancer Cousin        dx. in her 72s or 78s (maternal first cousin)  . Breast cancer Cousin        dx. in her 22s (maternal first cousin)    Social History   Socioeconomic History  . Marital status: Married    Spouse name: Not on file  . Number of children: Not on file  . Years of education: Not on file  . Highest education level: Not on file  Occupational History  . Not on file  Tobacco Use  . Smoking status: Former Research scientist (life sciences)  . Smokeless tobacco: Never Used  . Tobacco comment: Quit 29 years ago  Vaping Use  . Vaping Use: Never used  Substance and Sexual Activity  . Alcohol use: Never  . Drug use: Never  . Sexual activity: Not on file  Other Topics Concern  . Not on file  Social History Narrative  . Not on file   Social Determinants of Health   Financial Resource Strain:   . Difficulty of Paying Living Expenses: Not on file  Food Insecurity:   . Worried About Charity fundraiser in the Last Year: Not on file  . Ran Out of Food in  the Last Year: Not on file  Transportation Needs:   . Lack of Transportation (Medical): Not on file  . Lack of Transportation (Non-Medical): Not on file  Physical Activity:   . Days of Exercise per Week: Not on file  . Minutes of Exercise per Session: Not on file  Stress:   . Feeling of Stress : Not on file  Social Connections:   . Frequency of Communication with Friends and Family: Not on  file  . Frequency of Social Gatherings with Friends and Family: Not on file  . Attends Religious Services: Not on file  . Active Member of Clubs or Organizations: Not on file  . Attends Archivist Meetings: Not on file  . Marital Status: Not on file     Review of Systems: A 12 point ROS discussed and pertinent positives are indicated in the HPI above.  All other systems are negative.  Review of Systems  Constitutional: Positive for activity change and fatigue. Negative for fever.  Respiratory: Negative for cough.   Gastrointestinal: Positive for abdominal distention and abdominal pain.  Neurological: Positive for weakness.  Psychiatric/Behavioral: Negative for behavioral problems and confusion.    Vital Signs: BP (!) 159/65   Pulse (!) 58   Temp 97.6 F (36.4 C) (Oral)   Resp 14   Ht 5' 2.5" (1.588 m)   Wt 132 lb (59.9 kg)   SpO2 98%   BMI 23.76 kg/m   Physical Exam Vitals reviewed.  Cardiovascular:     Rate and Rhythm: Normal rate and regular rhythm.     Heart sounds: Normal heart sounds.  Pulmonary:     Effort: Pulmonary effort is normal.     Breath sounds: Normal breath sounds.  Abdominal:     Palpations: Abdomen is soft.     Tenderness: There is abdominal tenderness.  Musculoskeletal:        General: Normal range of motion.  Skin:    General: Skin is warm.     Comments: LLQ reddened area +leakage from would   Neurological:     Mental Status: She is alert and oriented to person, place, and time.  Psychiatric:        Behavior: Behavior normal.      Imaging: US Abdomen Limited  Result Date: 11/03/2019 CLINICAL DATA:  Evaluation for abdominal seroma. EXAM: ULTRASOUND ABDOMEN LIMITED COMPARISON:  CT 07/21/2019. FINDINGS: A 9.2 x 6.3 x 8.9 cm complex cystic mass with some internal vascularity noted in the left lower quadrant in the region of clinical concern. Process such as abscess or hematoma could present this fashion. Tumor cannot be excluded. No other focal abnormality identified. IMPRESSION: A 9.2 x 6.3 x 8.9 cm complex cystic mass with some internal vascularity noted the left lower quadrant in region of clinical concern. A process such as an abscess or hematoma could present this fashion. Tumor cannot be excluded. Electronically Signed   By: Marcello Moores  Register   On: 11/03/2019 09:20    Labs:  CBC: Recent Labs    08/11/19 1343 08/16/19 0527 08/17/19 0519  WBC 6.3 15.0* 11.1*  HGB 11.4* 9.9* 9.0*  HCT 36.1 29.8* 28.7*  PLT 136* 140* 120*    COAGS: No results for input(s): INR, APTT in the last 8760 hours.  BMP: Recent Labs    08/11/19 1343 08/16/19 0527 08/17/19 0519  NA 144 139 142  K 3.4* 3.8 3.5  CL 103 103 107  CO2 29 26 25   GLUCOSE 141* 173* 113*  BUN 13 15 13   CALCIUM 8.9 8.2* 8.3*  CREATININE 0.68 0.72 0.69  GFRNONAA >60 >60 >60  GFRAA >60 >60 >60    LIVER FUNCTION TESTS: Recent Labs    08/11/19 1343  BILITOT 0.7  AST 14*  ALT 12  ALKPHOS 73  PROT 6.8  ALBUMIN 3.7    TUMOR MARKERS: No results for input(s): AFPTM, CEA, CA199, CHROMGRNA in the last 8760 hours.  Assessment and Plan:  Ovarian cancer  Abdominal surgery - hysterectomy; omentectomy and debulking; ventral hernia repair 08/15/19 Developed abd pain; and weaping from LLQ wound Imaging revealing seroma Now scheduled for seroma aspiration/drain Risks and benefits discussed with the patient including bleeding, infection, damage to adjacent structures, bowel perforation/fistula connection, and sepsis.  All of the patient's questions  were answered, patient is agreeable to proceed. Consent signed and in chart.  Thank you for this interesting consult.  I greatly enjoyed meeting Kynlea Blackston Kallal and look forward to participating in their care.  A copy of this report was sent to the requesting provider on this date.  Electronically Signed: Lavonia Drafts, PA-C 11/14/2019, 12:03 PM   I spent a total of  30 Minutes   in face to face in clinical consultation, greater than 50% of which was counseling/coordinating care for seroma aspiration/drain

## 2019-11-14 NOTE — Discharge Instructions (Signed)

## 2019-11-14 NOTE — Procedures (Signed)
Interventional Radiology Procedure Note  Procedure: Ultrasound guided abdominal wall fluid aspiration  Findings: Please refer to procedural dictation for full description.  Complex collection, mostly solid with small central cystic component.  Aspiration performed yielding approximately 5 mL opaque, brown fluid.  Sample sent for culture.  Complications: None immediate  Estimated Blood Loss: None  Recommendations: Follow up culture results   Ruthann Cancer, MD

## 2019-11-15 ENCOUNTER — Ambulatory Visit (HOSPITAL_COMMUNITY): Payer: Medicare Other

## 2019-11-16 DIAGNOSIS — C786 Secondary malignant neoplasm of retroperitoneum and peritoneum: Secondary | ICD-10-CM | POA: Diagnosis not present

## 2019-11-16 DIAGNOSIS — C569 Malignant neoplasm of unspecified ovary: Secondary | ICD-10-CM | POA: Diagnosis not present

## 2019-11-17 DIAGNOSIS — J309 Allergic rhinitis, unspecified: Secondary | ICD-10-CM | POA: Diagnosis not present

## 2019-11-17 DIAGNOSIS — J449 Chronic obstructive pulmonary disease, unspecified: Secondary | ICD-10-CM | POA: Diagnosis not present

## 2019-11-17 DIAGNOSIS — F339 Major depressive disorder, recurrent, unspecified: Secondary | ICD-10-CM | POA: Diagnosis not present

## 2019-11-17 DIAGNOSIS — I10 Essential (primary) hypertension: Secondary | ICD-10-CM | POA: Diagnosis not present

## 2019-11-19 LAB — AEROBIC/ANAEROBIC CULTURE W GRAM STAIN (SURGICAL/DEEP WOUND)

## 2019-11-24 DIAGNOSIS — E0590 Thyrotoxicosis, unspecified without thyrotoxic crisis or storm: Secondary | ICD-10-CM | POA: Diagnosis not present

## 2019-11-24 DIAGNOSIS — F339 Major depressive disorder, recurrent, unspecified: Secondary | ICD-10-CM | POA: Diagnosis not present

## 2019-11-24 DIAGNOSIS — C569 Malignant neoplasm of unspecified ovary: Secondary | ICD-10-CM | POA: Diagnosis not present

## 2019-11-24 DIAGNOSIS — D509 Iron deficiency anemia, unspecified: Secondary | ICD-10-CM | POA: Diagnosis not present

## 2019-11-24 DIAGNOSIS — J449 Chronic obstructive pulmonary disease, unspecified: Secondary | ICD-10-CM | POA: Diagnosis not present

## 2019-11-24 DIAGNOSIS — I10 Essential (primary) hypertension: Secondary | ICD-10-CM | POA: Diagnosis not present

## 2019-11-28 DIAGNOSIS — C569 Malignant neoplasm of unspecified ovary: Secondary | ICD-10-CM | POA: Diagnosis not present

## 2019-11-28 DIAGNOSIS — H401131 Primary open-angle glaucoma, bilateral, mild stage: Secondary | ICD-10-CM | POA: Diagnosis not present

## 2019-11-28 DIAGNOSIS — Z961 Presence of intraocular lens: Secondary | ICD-10-CM | POA: Diagnosis not present

## 2019-11-28 DIAGNOSIS — D509 Iron deficiency anemia, unspecified: Secondary | ICD-10-CM | POA: Diagnosis not present

## 2019-11-28 LAB — BASIC METABOLIC PANEL
BUN: 18 (ref 4–21)
CO2: 27 — AB (ref 13–22)
Chloride: 104 (ref 99–108)
Creatinine: 0.8 (ref 0.5–1.1)
Glucose: 120
Potassium: 4.1 (ref 3.4–5.3)
Sodium: 141 (ref 137–147)

## 2019-11-28 LAB — IRON,TIBC AND FERRITIN PANEL
%SAT: 97.6
Ferritin: 553
Iron: 209
TIBC: 214

## 2019-11-28 LAB — HEPATIC FUNCTION PANEL
ALT: 8 (ref 7–35)
AST: 14 (ref 13–35)
Alkaline Phosphatase: 77 (ref 25–125)
Bilirubin, Total: 0.5

## 2019-11-28 LAB — CBC: RBC: 3.6 — AB (ref 3.87–5.11)

## 2019-11-28 LAB — CBC AND DIFFERENTIAL
HCT: 27 — AB (ref 36–46)
Hemoglobin: 8.7 — AB (ref 12.0–16.0)
Neutrophils Absolute: 2710
Platelets: 152 (ref 150–399)
WBC: 4.1

## 2019-11-28 LAB — COMPREHENSIVE METABOLIC PANEL
Albumin: 3.4 — AB (ref 3.5–5.0)
Calcium: 8.8 (ref 8.7–10.7)

## 2019-11-28 LAB — VITAMIN B12: Vitamin B-12: 411

## 2019-12-02 ENCOUNTER — Encounter: Payer: Self-pay | Admitting: Pharmacist

## 2019-12-02 DIAGNOSIS — D638 Anemia in other chronic diseases classified elsewhere: Secondary | ICD-10-CM

## 2019-12-07 ENCOUNTER — Other Ambulatory Visit: Payer: Self-pay | Admitting: Hematology and Oncology

## 2019-12-08 ENCOUNTER — Inpatient Hospital Stay: Payer: Medicare Other | Attending: Oncology

## 2019-12-08 ENCOUNTER — Other Ambulatory Visit: Payer: Self-pay

## 2019-12-08 VITALS — BP 168/67 | HR 61 | Temp 98.0°F | Resp 18

## 2019-12-08 DIAGNOSIS — D638 Anemia in other chronic diseases classified elsewhere: Secondary | ICD-10-CM | POA: Insufficient documentation

## 2019-12-08 DIAGNOSIS — C786 Secondary malignant neoplasm of retroperitoneum and peritoneum: Secondary | ICD-10-CM | POA: Insufficient documentation

## 2019-12-08 DIAGNOSIS — C569 Malignant neoplasm of unspecified ovary: Secondary | ICD-10-CM | POA: Diagnosis not present

## 2019-12-08 MED ORDER — EPOETIN ALFA-EPBX 40000 UNIT/ML IJ SOLN
40000.0000 [IU] | Freq: Once | INTRAMUSCULAR | Status: AC
  Administered 2019-12-08: 40000 [IU] via SUBCUTANEOUS

## 2019-12-08 MED ORDER — EPOETIN ALFA-EPBX 40000 UNIT/ML IJ SOLN
INTRAMUSCULAR | Status: AC
  Filled 2019-12-08: qty 1

## 2019-12-08 NOTE — Progress Notes (Signed)
PT STABLE AT TIME OF DISCHARGE 

## 2019-12-19 DIAGNOSIS — J90 Pleural effusion, not elsewhere classified: Secondary | ICD-10-CM | POA: Diagnosis not present

## 2019-12-19 DIAGNOSIS — D509 Iron deficiency anemia, unspecified: Secondary | ICD-10-CM | POA: Diagnosis present

## 2019-12-19 DIAGNOSIS — E785 Hyperlipidemia, unspecified: Secondary | ICD-10-CM | POA: Diagnosis present

## 2019-12-19 DIAGNOSIS — R600 Localized edema: Secondary | ICD-10-CM | POA: Diagnosis not present

## 2019-12-19 DIAGNOSIS — Z90710 Acquired absence of both cervix and uterus: Secondary | ICD-10-CM | POA: Diagnosis not present

## 2019-12-19 DIAGNOSIS — E0590 Thyrotoxicosis, unspecified without thyrotoxic crisis or storm: Secondary | ICD-10-CM | POA: Diagnosis present

## 2019-12-19 DIAGNOSIS — N99842 Postprocedural seroma of a genitourinary system organ or structure following a genitourinary system procedure: Secondary | ICD-10-CM | POA: Diagnosis present

## 2019-12-19 DIAGNOSIS — J189 Pneumonia, unspecified organism: Secondary | ICD-10-CM | POA: Diagnosis not present

## 2019-12-19 DIAGNOSIS — I10 Essential (primary) hypertension: Secondary | ICD-10-CM | POA: Diagnosis present

## 2019-12-19 DIAGNOSIS — D696 Thrombocytopenia, unspecified: Secondary | ICD-10-CM | POA: Diagnosis present

## 2019-12-19 DIAGNOSIS — Z79899 Other long term (current) drug therapy: Secondary | ICD-10-CM | POA: Diagnosis not present

## 2019-12-19 DIAGNOSIS — I700 Atherosclerosis of aorta: Secondary | ICD-10-CM | POA: Diagnosis not present

## 2019-12-19 DIAGNOSIS — Z87891 Personal history of nicotine dependence: Secondary | ICD-10-CM | POA: Diagnosis not present

## 2019-12-19 DIAGNOSIS — M546 Pain in thoracic spine: Secondary | ICD-10-CM | POA: Diagnosis not present

## 2019-12-19 DIAGNOSIS — R188 Other ascites: Secondary | ICD-10-CM | POA: Diagnosis not present

## 2019-12-19 DIAGNOSIS — L7632 Postprocedural hematoma of skin and subcutaneous tissue following other procedure: Secondary | ICD-10-CM | POA: Diagnosis not present

## 2019-12-19 DIAGNOSIS — Z7982 Long term (current) use of aspirin: Secondary | ICD-10-CM | POA: Diagnosis not present

## 2019-12-19 DIAGNOSIS — E039 Hypothyroidism, unspecified: Secondary | ICD-10-CM | POA: Diagnosis present

## 2019-12-19 DIAGNOSIS — J181 Lobar pneumonia, unspecified organism: Secondary | ICD-10-CM | POA: Diagnosis not present

## 2019-12-19 DIAGNOSIS — F418 Other specified anxiety disorders: Secondary | ICD-10-CM | POA: Diagnosis present

## 2019-12-19 DIAGNOSIS — R079 Chest pain, unspecified: Secondary | ICD-10-CM | POA: Diagnosis not present

## 2019-12-19 DIAGNOSIS — I480 Paroxysmal atrial fibrillation: Secondary | ICD-10-CM | POA: Diagnosis present

## 2019-12-19 DIAGNOSIS — Z8543 Personal history of malignant neoplasm of ovary: Secondary | ICD-10-CM | POA: Diagnosis not present

## 2019-12-19 DIAGNOSIS — D649 Anemia, unspecified: Secondary | ICD-10-CM | POA: Diagnosis not present

## 2019-12-19 DIAGNOSIS — Z5329 Procedure and treatment not carried out because of patient's decision for other reasons: Secondary | ICD-10-CM | POA: Diagnosis not present

## 2019-12-19 DIAGNOSIS — M542 Cervicalgia: Secondary | ICD-10-CM | POA: Diagnosis not present

## 2019-12-25 ENCOUNTER — Other Ambulatory Visit: Payer: Self-pay | Admitting: Oncology

## 2019-12-25 DIAGNOSIS — E0590 Thyrotoxicosis, unspecified without thyrotoxic crisis or storm: Secondary | ICD-10-CM | POA: Diagnosis not present

## 2019-12-25 DIAGNOSIS — C569 Malignant neoplasm of unspecified ovary: Secondary | ICD-10-CM

## 2019-12-25 DIAGNOSIS — F339 Major depressive disorder, recurrent, unspecified: Secondary | ICD-10-CM | POA: Diagnosis not present

## 2019-12-25 DIAGNOSIS — I10 Essential (primary) hypertension: Secondary | ICD-10-CM | POA: Diagnosis not present

## 2019-12-25 DIAGNOSIS — J449 Chronic obstructive pulmonary disease, unspecified: Secondary | ICD-10-CM | POA: Diagnosis not present

## 2019-12-25 NOTE — Progress Notes (Signed)
[facility]  [address] [place],  [place]  [address] [phone]  Clinic Day:  12/26/2019  Referring physician: Marco Collie, MD   HISTORY OF PRESENT ILLNESS:  The patient is a 79 y.o. female with with FIGO stage IVB ovarian cancer, which included peritoneal disease and a metastatic nodule over her left abdominal wall.  She underwent 6 cycles of carboplatin/paclitaxel before undergoing a TAHBSO/maximum debulking surgery, as well as abdominal hernia surgery, in August 2021.  Over the past 2 months, the patient has been taking olaparib to prevent early disease recurrence from her ovarian cancer.   Just recently, the patient was admitted to the hospital for failure to thrive.  At that time, her CBC showed a low hemoglobin of only 5.8.  This led to her being transfused multiple units of blood.  Both her olaparib and tapazole were held during her hospitalization as there was a concern medication-induced myelosuppresion was taking place.   She had CT scans done, which showed no obvious evidence of disease recurrence.  Pneumonia was seen, for which she was placed on IV antibiotics.  Scans showed her previous pelvic/subcutaneous abscess has decreased significantly in size. Since being discharged, the patient has been doing well.  She denies having any new symptoms which concern her for disease recurrence.     PHYSICAL EXAM:  Blood pressure (!) 205/81, pulse 71, temperature 98.2 F (36.8 C), resp. rate 14, height 5\' 2"  (1.575 m), weight 133 lb 11.2 oz (60.6 kg), SpO2 93 %. Wt Readings from Last 3 Encounters:  12/26/19 133 lb 11.2 oz (60.6 kg)  12/26/19 135 lb 4 oz (61.3 kg)  11/14/19 132 lb (59.9 kg)   Body mass index is 24.45 kg/m. Performance status (ECOG): 1 Physical Exam Constitutional:      Appearance: Normal appearance.  HENT:     Mouth/Throat:     Pharynx: Oropharynx is clear. No oropharyngeal exudate.  Cardiovascular:     Rate and Rhythm: Normal  rate and regular rhythm.     Heart sounds: No murmur heard.  No friction rub. No gallop.   Pulmonary:     Breath sounds: Normal breath sounds.  Abdominal:     General: Bowel sounds are normal. There is no distension.     Palpations: Abdomen is soft. There is no mass.     Tenderness: There is no abdominal tenderness.  Musculoskeletal:        General: No tenderness.     Cervical back: Normal range of motion and neck supple.     Right lower leg: No edema.     Left lower leg: No edema.  Lymphadenopathy:     Cervical: No cervical adenopathy.     Right cervical: No superficial, deep or posterior cervical adenopathy.    Left cervical: No superficial, deep or posterior cervical adenopathy.     Upper Body:     Right upper body: No supraclavicular or axillary adenopathy.     Left upper body: No supraclavicular or axillary adenopathy.     Lower Body: No right inguinal adenopathy. No left inguinal adenopathy.  Skin:    Coloration: Skin is not jaundiced.     Findings: Lesion (subcutaneous area over her left abdomen is mildly draining.  The area conitnues to heal ) present. No rash.  Neurological:     General: No focal deficit present.     Mental Status: She is alert and oriented to person, place, and time. Mental status is at baseline.  Psychiatric:        Mood and Affect: Mood normal.        Behavior: Behavior normal.        Thought Content: Thought content normal.        Judgment: Judgment normal.     LABS:         ASSESSMENT & PLAN:   Assessment/Plan:  A 79 y.o. female with FIGO stage IVB ovarian cancer, status post 6 cycles of carboplatin/paclitaxel, followed by TAHBSO/maximum debulking surgery.  As the patient is doing much better since she was discharged from the hospital, I have told her to restart her olaparib tomorrow to prevent early recurrence of her ovarian cancer. I am pleased as her hemoglobin has returned to higher levels.  Her anemia will continue to be followed.  I  am also pleased as her left-sided fluid collection/abscess in her abdomen has improved significantly.  Clinically, the patient is doing well.  I will see her back in 2 months for repeat clinical assessment.  The patient understands all the plans discussed today and is in agreement with them.      Emilene Roma Macarthur Critchley, MD

## 2019-12-26 ENCOUNTER — Inpatient Hospital Stay: Payer: Medicare Other

## 2019-12-26 ENCOUNTER — Inpatient Hospital Stay: Payer: Medicare Other | Attending: Oncology | Admitting: Oncology

## 2019-12-26 ENCOUNTER — Telehealth: Payer: Self-pay | Admitting: Oncology

## 2019-12-26 ENCOUNTER — Other Ambulatory Visit: Payer: Self-pay

## 2019-12-26 VITALS — BP 205/81 | HR 71 | Temp 98.2°F | Resp 14 | Ht 62.0 in | Wt 133.7 lb

## 2019-12-26 DIAGNOSIS — C569 Malignant neoplasm of unspecified ovary: Secondary | ICD-10-CM | POA: Diagnosis not present

## 2019-12-26 DIAGNOSIS — D509 Iron deficiency anemia, unspecified: Secondary | ICD-10-CM | POA: Diagnosis not present

## 2019-12-26 NOTE — Progress Notes (Signed)
PT STABLE AT TIME OF DISCHARGE 

## 2019-12-26 NOTE — Telephone Encounter (Signed)
Per 11/2 LOS.Appts given to patient

## 2019-12-28 DIAGNOSIS — E0590 Thyrotoxicosis, unspecified without thyrotoxic crisis or storm: Secondary | ICD-10-CM | POA: Diagnosis not present

## 2019-12-28 DIAGNOSIS — I10 Essential (primary) hypertension: Secondary | ICD-10-CM | POA: Diagnosis not present

## 2019-12-29 DIAGNOSIS — I10 Essential (primary) hypertension: Secondary | ICD-10-CM | POA: Diagnosis not present

## 2020-01-03 ENCOUNTER — Telehealth: Payer: Self-pay

## 2020-01-03 DIAGNOSIS — E0590 Thyrotoxicosis, unspecified without thyrotoxic crisis or storm: Secondary | ICD-10-CM | POA: Diagnosis not present

## 2020-01-03 DIAGNOSIS — I10 Essential (primary) hypertension: Secondary | ICD-10-CM | POA: Diagnosis not present

## 2020-01-03 NOTE — Telephone Encounter (Signed)
I spoke with pt to complete oral chemo assessment for Cycle 3 wk 1 of Lynparza ( she takes Lynparza 150 mg 2 tabs po BID). Pt denies missed doses. She takes medication @ 8 am w/food, & again @ 8 pm after eating. She reports body aches (which she has had in the past with this medication), denies other symptoms. Afebrile. She is aware that if she misses a dose, to just skip that dose and take next scheduled dose. Reminded pt to call us with any questions/concerns, and if temp over 100.4. Pt verbalized understanding. [phone]

## 2020-01-04 ENCOUNTER — Telehealth: Payer: Self-pay | Admitting: Oncology

## 2020-01-04 NOTE — Telephone Encounter (Signed)
Per Staff Message 11/11 - Pt notified of CX 11/12 Inj Appt - Scheduled 12/9 Lab Appt. Ok per pt

## 2020-01-05 ENCOUNTER — Inpatient Hospital Stay: Payer: Medicare Other

## 2020-01-17 ENCOUNTER — Other Ambulatory Visit: Payer: Self-pay

## 2020-01-17 DIAGNOSIS — C569 Malignant neoplasm of unspecified ovary: Secondary | ICD-10-CM

## 2020-01-17 MED ORDER — OLAPARIB 150 MG PO TABS: 300.0000 mg | ORAL_TABLET | Freq: Two times a day (BID) | ORAL | 0 refills | Status: DC

## 2020-01-22 DIAGNOSIS — I10 Essential (primary) hypertension: Secondary | ICD-10-CM | POA: Diagnosis not present

## 2020-01-23 ENCOUNTER — Telehealth: Payer: Self-pay

## 2020-01-23 DIAGNOSIS — I10 Essential (primary) hypertension: Secondary | ICD-10-CM | POA: Diagnosis not present

## 2020-01-23 NOTE — Telephone Encounter (Signed)
Megan Waller had LVM on nurse triage line on Thursday, and again on Friday req call back about her mom's medication. I spoke with Megan Waller, & told her we were closed Thursday & Friday for Thanksgiving. She states everything is ok now, she thought her mom had a bug on Thursday & Friday. She had increased diarrhea. She told her mom to stop the [place] but [name] didn't want to. She ended up, only missing 1 dose of Lynparza. I educated her on proper Imodium directions, up to 8 tabs/day. I also told her to make sure [name] drinks a cup of fluid for every loose stool she has, to avoid dehydration. Pt has been afebrile, &  no N/V. The diarrhea most likely is from the [place]

## 2020-01-30 ENCOUNTER — Other Ambulatory Visit: Payer: Self-pay | Admitting: Pharmacist

## 2020-01-30 DIAGNOSIS — E0590 Thyrotoxicosis, unspecified without thyrotoxic crisis or storm: Secondary | ICD-10-CM | POA: Diagnosis not present

## 2020-01-30 DIAGNOSIS — I10 Essential (primary) hypertension: Secondary | ICD-10-CM | POA: Diagnosis not present

## 2020-01-30 DIAGNOSIS — F419 Anxiety disorder, unspecified: Secondary | ICD-10-CM | POA: Diagnosis not present

## 2020-01-30 DIAGNOSIS — C569 Malignant neoplasm of unspecified ovary: Secondary | ICD-10-CM | POA: Diagnosis not present

## 2020-02-01 ENCOUNTER — Inpatient Hospital Stay: Payer: Medicare Other | Attending: Oncology

## 2020-02-01 ENCOUNTER — Telehealth: Payer: Self-pay

## 2020-02-01 ENCOUNTER — Other Ambulatory Visit: Payer: Self-pay

## 2020-02-01 DIAGNOSIS — D631 Anemia in chronic kidney disease: Secondary | ICD-10-CM | POA: Diagnosis not present

## 2020-02-01 DIAGNOSIS — N189 Chronic kidney disease, unspecified: Secondary | ICD-10-CM | POA: Diagnosis not present

## 2020-02-01 DIAGNOSIS — Z7982 Long term (current) use of aspirin: Secondary | ICD-10-CM | POA: Diagnosis not present

## 2020-02-01 DIAGNOSIS — Z9221 Personal history of antineoplastic chemotherapy: Secondary | ICD-10-CM | POA: Diagnosis not present

## 2020-02-01 DIAGNOSIS — I4891 Unspecified atrial fibrillation: Secondary | ICD-10-CM | POA: Diagnosis not present

## 2020-02-01 DIAGNOSIS — D649 Anemia, unspecified: Secondary | ICD-10-CM | POA: Diagnosis not present

## 2020-02-01 DIAGNOSIS — Z0001 Encounter for general adult medical examination with abnormal findings: Secondary | ICD-10-CM | POA: Diagnosis not present

## 2020-02-01 DIAGNOSIS — Z87891 Personal history of nicotine dependence: Secondary | ICD-10-CM | POA: Insufficient documentation

## 2020-02-01 DIAGNOSIS — G20 Parkinson's disease: Secondary | ICD-10-CM | POA: Insufficient documentation

## 2020-02-01 DIAGNOSIS — C569 Malignant neoplasm of unspecified ovary: Secondary | ICD-10-CM | POA: Diagnosis not present

## 2020-02-01 DIAGNOSIS — I129 Hypertensive chronic kidney disease with stage 1 through stage 4 chronic kidney disease, or unspecified chronic kidney disease: Secondary | ICD-10-CM | POA: Insufficient documentation

## 2020-02-01 DIAGNOSIS — C786 Secondary malignant neoplasm of retroperitoneum and peritoneum: Secondary | ICD-10-CM | POA: Insufficient documentation

## 2020-02-01 DIAGNOSIS — Z90722 Acquired absence of ovaries, bilateral: Secondary | ICD-10-CM | POA: Insufficient documentation

## 2020-02-01 DIAGNOSIS — Z90710 Acquired absence of both cervix and uterus: Secondary | ICD-10-CM | POA: Diagnosis not present

## 2020-02-01 DIAGNOSIS — J449 Chronic obstructive pulmonary disease, unspecified: Secondary | ICD-10-CM | POA: Insufficient documentation

## 2020-02-01 DIAGNOSIS — Z79899 Other long term (current) drug therapy: Secondary | ICD-10-CM | POA: Insufficient documentation

## 2020-02-01 DIAGNOSIS — D638 Anemia in other chronic diseases classified elsewhere: Secondary | ICD-10-CM

## 2020-02-01 LAB — HEPATIC FUNCTION PANEL
ALT: 8 (ref 7–35)
AST: 16 (ref 13–35)
Alkaline Phosphatase: 78 (ref 25–125)
Bilirubin, Total: 0.8

## 2020-02-01 LAB — BASIC METABOLIC PANEL
BUN: 17 (ref 4–21)
CO2: 30 — AB (ref 13–22)
Chloride: 100 (ref 99–108)
Creatinine: 0.9 (ref 0.5–1.1)
Glucose: 114
Potassium: 3.7 (ref 3.4–5.3)
Sodium: 139 (ref 137–147)

## 2020-02-01 LAB — CBC AND DIFFERENTIAL
HCT: 20 — AB (ref 36–46)
Hemoglobin: 6.8 — AB (ref 12.0–16.0)
Neutrophils Absolute: 2.12
Platelets: 126 — AB (ref 150–399)
WBC: 3.6

## 2020-02-01 LAB — COMPREHENSIVE METABOLIC PANEL
Albumin: 3.6 (ref 3.5–5.0)
Calcium: 8.6 — AB (ref 8.7–10.7)

## 2020-02-01 LAB — CBC: RBC: 2.19 — AB (ref 3.87–5.11)

## 2020-02-01 NOTE — Telephone Encounter (Signed)
Camille from Hematology called critical HGB 6.8 on patient. Read back and verified.  Dr. Bobby Rumpf notified of critical lab value.

## 2020-02-02 ENCOUNTER — Other Ambulatory Visit: Payer: Self-pay

## 2020-02-02 ENCOUNTER — Inpatient Hospital Stay: Payer: Medicare Other

## 2020-02-02 ENCOUNTER — Other Ambulatory Visit: Payer: Self-pay | Admitting: Hematology and Oncology

## 2020-02-02 VITALS — BP 179/79 | HR 77 | Temp 98.5°F | Resp 16 | Ht 62.0 in | Wt 131.6 lb

## 2020-02-02 DIAGNOSIS — D638 Anemia in other chronic diseases classified elsewhere: Secondary | ICD-10-CM

## 2020-02-02 DIAGNOSIS — C569 Malignant neoplasm of unspecified ovary: Secondary | ICD-10-CM | POA: Diagnosis not present

## 2020-02-02 DIAGNOSIS — I129 Hypertensive chronic kidney disease with stage 1 through stage 4 chronic kidney disease, or unspecified chronic kidney disease: Secondary | ICD-10-CM | POA: Diagnosis not present

## 2020-02-02 DIAGNOSIS — N189 Chronic kidney disease, unspecified: Secondary | ICD-10-CM | POA: Diagnosis not present

## 2020-02-02 DIAGNOSIS — Z9221 Personal history of antineoplastic chemotherapy: Secondary | ICD-10-CM | POA: Diagnosis not present

## 2020-02-02 DIAGNOSIS — C786 Secondary malignant neoplasm of retroperitoneum and peritoneum: Secondary | ICD-10-CM | POA: Diagnosis not present

## 2020-02-02 DIAGNOSIS — D631 Anemia in chronic kidney disease: Secondary | ICD-10-CM | POA: Diagnosis not present

## 2020-02-02 LAB — CBC: MCV: 92 (ref 81–99)

## 2020-02-02 LAB — CA 125: Cancer Antigen (CA) 125: 8.4 U/mL (ref 0.0–38.1)

## 2020-02-02 LAB — PREPARE RBC (CROSSMATCH)

## 2020-02-02 MED ORDER — EPOETIN ALFA-EPBX 40000 UNIT/ML IJ SOLN
INTRAMUSCULAR | Status: AC
  Filled 2020-02-02: qty 1

## 2020-02-02 MED ORDER — EPOETIN ALFA-EPBX 40000 UNIT/ML IJ SOLN
40000.0000 [IU] | Freq: Once | INTRAMUSCULAR | Status: AC
  Administered 2020-02-02: 40000 [IU] via SUBCUTANEOUS

## 2020-02-02 NOTE — Patient Instructions (Signed)

## 2020-02-02 NOTE — Progress Notes (Unsigned)
The patient had labs on  December 9th prior to Retacrit today and was found to have a hemoglobin of 6.8.  She feels weak, but is otherwise asymptomatic without dyspnea or chest pain. We were unable to get her scheduled for transfusion at the infusion center until December 16th.  The patient preferred not to go to [facility] for transfusion, so she is scheduled to receive 2 units of packed red blood cells on December 14th at [facility] at 8:30 a.m.. I notified her daughter [name] as requested by the patient.  The patient will need to come back on Monday, December 13th for her type and screen , which I scheduled for 3:30 p.m.. Her daughter knows if she has worsening symptoms, she may have to come to the emergency department.

## 2020-02-02 NOTE — Progress Notes (Signed)
PT STABLE AT TIME OF DISCHARGE 

## 2020-02-03 ENCOUNTER — Other Ambulatory Visit: Payer: Self-pay

## 2020-02-03 ENCOUNTER — Inpatient Hospital Stay: Payer: Medicare Other

## 2020-02-03 DIAGNOSIS — C786 Secondary malignant neoplasm of retroperitoneum and peritoneum: Secondary | ICD-10-CM | POA: Diagnosis not present

## 2020-02-03 DIAGNOSIS — C569 Malignant neoplasm of unspecified ovary: Secondary | ICD-10-CM | POA: Diagnosis not present

## 2020-02-03 DIAGNOSIS — Z9221 Personal history of antineoplastic chemotherapy: Secondary | ICD-10-CM | POA: Diagnosis not present

## 2020-02-03 DIAGNOSIS — I129 Hypertensive chronic kidney disease with stage 1 through stage 4 chronic kidney disease, or unspecified chronic kidney disease: Secondary | ICD-10-CM | POA: Diagnosis not present

## 2020-02-03 DIAGNOSIS — D638 Anemia in other chronic diseases classified elsewhere: Secondary | ICD-10-CM

## 2020-02-03 DIAGNOSIS — N189 Chronic kidney disease, unspecified: Secondary | ICD-10-CM | POA: Diagnosis not present

## 2020-02-03 DIAGNOSIS — D631 Anemia in chronic kidney disease: Secondary | ICD-10-CM | POA: Diagnosis not present

## 2020-02-03 MED ORDER — HEPARIN SOD (PORK) LOCK FLUSH 100 UNIT/ML IV SOLN
500.0000 [IU] | Freq: Every day | INTRAVENOUS | Status: AC | PRN
  Administered 2020-02-03: 13:00:00 500 [IU]
  Filled 2020-02-03: qty 5

## 2020-02-03 MED ORDER — DIPHENHYDRAMINE HCL 25 MG PO CAPS
ORAL_CAPSULE | ORAL | Status: AC
  Filled 2020-02-03: qty 1

## 2020-02-03 MED ORDER — SODIUM CHLORIDE 0.9% FLUSH
10.0000 mL | INTRAVENOUS | Status: AC | PRN
  Administered 2020-02-03: 13:00:00 10 mL
  Filled 2020-02-03: qty 10

## 2020-02-03 MED ORDER — DIPHENHYDRAMINE HCL 25 MG PO CAPS
25.0000 mg | ORAL_CAPSULE | Freq: Once | ORAL | Status: AC
  Administered 2020-02-03: 09:00:00 25 mg via ORAL

## 2020-02-03 MED ORDER — SODIUM CHLORIDE 0.9% FLUSH
3.0000 mL | INTRAVENOUS | Status: DC | PRN
  Filled 2020-02-03: qty 10

## 2020-02-03 MED ORDER — ACETAMINOPHEN 325 MG PO TABS
ORAL_TABLET | ORAL | Status: AC
  Filled 2020-02-03: qty 2

## 2020-02-03 MED ORDER — ACETAMINOPHEN 325 MG PO TABS
650.0000 mg | ORAL_TABLET | Freq: Once | ORAL | Status: AC
  Administered 2020-02-03: 09:00:00 650 mg via ORAL

## 2020-02-03 MED ORDER — SODIUM CHLORIDE 0.9% IV SOLUTION
250.0000 mL | Freq: Once | INTRAVENOUS | Status: AC
  Administered 2020-02-03: 09:00:00 250 mL via INTRAVENOUS
  Filled 2020-02-03: qty 250

## 2020-02-03 NOTE — Patient Instructions (Signed)

## 2020-02-05 ENCOUNTER — Other Ambulatory Visit: Payer: Medicare Other

## 2020-02-05 LAB — BPAM RBC
Blood Product Expiration Date: 202201102359
Blood Product Expiration Date: 202201102359
ISSUE DATE / TIME: 202112110920
ISSUE DATE / TIME: 202112110920
Unit Type and Rh: 5100
Unit Type and Rh: 5100

## 2020-02-05 LAB — TYPE AND SCREEN
ABO/RH(D): O POS
Antibody Screen: NEGATIVE
Unit division: 0
Unit division: 0

## 2020-02-08 ENCOUNTER — Encounter: Payer: Self-pay | Admitting: Oncology

## 2020-02-09 ENCOUNTER — Encounter: Payer: Self-pay | Admitting: Gynecologic Oncology

## 2020-02-11 NOTE — Progress Notes (Signed)
Gynecologic Oncology Return Clinic Visit  02/12/20  Reason for Visit: surveillance visit in the setting of advanced ovarian cancer  Treatment History: Oncology History Overview Note  Patient presented in December with an abdominal nodule over her left medial abdomen that grew progressively over time.  She was seen by dermatology for this lesion and underwent a biopsy.  Ca-125: 1/13: 180 2/10: 96.9 3/16: 12.1 4/5: 9.7 12/9: 8.4   Ovarian cancer (HCC)  02/14/2019 Initial Biopsy   Left lower abdominal skin biopsy, metastatic adenocarcinoma.  Based on histologic and IHC findings, a gynecologic primary is favored.   02/14/2019 Initial Diagnosis   Ovarian cancer (HCC)   03/06/2019 Imaging   CT chest, abdomen, pelvis: Widespread peritoneal carcinomatosis with bilateral adnexal masses, bilateral external iliac adenopathy and a subcutaneous mass in the left periumbilical region, likely a soft tissue metastasis.  No evidence of thoracic metastatic disease.  Enlarging complex cystic and solid left thyroid nodule.  Large recurrent left anterior abdominal wall hernia containing portion of the transverse colon without evidence of incarceration or obstruction.  Cholelithiasis and aortic atherosclerosis.   03/15/2019 -  Chemotherapy   Carboplatin/taxol Cycle 1: 1/20 Cycle 2: 2/23 (delayed secondary to side effects), 25% dose reduction Cycle 3: 3/16  Cycle 5: 4/29; continued Neulasta to prevent severe neutropenia from delaying cycles of tx.     03/17/2019 Imaging   CT chest: No pulmonary embolus noted.  Small pleural effusions with bibasilar atelectasis.  No adenopathy.  Dominant left lobe thyroid mass, stable from prior CT.   05/29/2019 Imaging   CT abdomen and pelvis: Interval reduction in size of bilateral ovarian masses, varies now measuring within normal limits (2 x 1.4 cm on the right and 1.7 x 1.4 cm on the left), essentially complete resolution of previously seen extensive metastatic  omental and peritoneal nodularity about the abdomen and pelvis.  Resolution of bilateral inguinal lymphadenopathy. Parastomal hernia of the left lower quadrant containing nonobstructed loops of transverse colon and mid small bowel.   07/20/2019 Imaging   CT of the abdomen and pelvis shows stable 11 mm well-circumscribed left adrenal lesion in the body of the left adrenal.  Small cyst in the lower pole of the right kidney.  Signs of prior bowel resection of the small bowel and colon with anastomotic sites.  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  No adenopathy in the retroperitoneum or upper abdomen.  Right active adnexa measures 2 x 1.4 cm compared to 1.9 x 1.4 cm on last imaging.  Left adnexa measures 1.6 x 1.6 cm.  Uterus grossly normal on CT.  No pelvic adenopathy.  Ventral suprapubic hernia containing small bowel.  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   08/15/2019 Surgery   Robotic-assisted laparoscopic total hysterectomy with bilateral salpingoophorectomy, infracolic omentectomy Lysis of adhesions and hernia reduction (Dr. Ramirez), repair of abdominal wall hernia (Dr. Ramirez)  On EUA, small mobile uterus. On intra-abdominal entry, normal appearing liver edge, diaphragm, and stomach. Omentum adherent to the anterior abdominal wall along patient's prior midline incision. Transverse colon hernia through prior ostomy site. Otherwise, some adhesions of the sigmoid to the left sidewall. Uterus 6cm and normal appearing. Right adnexa normal in appearance. Left adnexa with adhesions to the sigmoid colon and adherent to the medial aspect of the broad ligament. Left ovary with evidence of treatment   effect. Several small (<44m) nodules on the peritoneum within the cul de sac c/w treated tumor implants (cauterized). Omentum without  obvious evidence of tumor burden.   08/15/2019 Pathology Results   A. UTERUS, BILATERAL TUBES AND OVARIES, HYSTERECTOMY AND  BILATERALSALPINGO- OOPHORECTOMY:   Ovaries, bilateral:  -  Residual carcinoma status post neoadjuvant therapy involving  bilateral ovaries  -  See oncology table and comment below   Fallopian tubes, bilateral:  -  Benign fallopian tubes  -  No malignancy identified   Uterus:  -  Benign endometrial polyp  -  Leiomyoma (1.1 cm)  -  Serosa with focal calcifications  -  No hyperplasia or malignancy identified   Cervix:  -  Benign cervix with nabothian cysts  -  No dysplasia or malignancy identified   B. OMENTUM, RESECTION:  -  Microscopic focus of metastatic adenocarcinoma (< 175m   ONCOLOGY TABLE:   OVARY or FALLOPIAN TUBE or PRIMARY PERITONEUM:   Procedure: Hysterectomy, bilateral salpingo-oophorectomy and omentectomy  Specimen Integrity: Intact  Tumor Site: Ovary  Ovarian Surface Involvement: Present  Fallopian Tube Surface Involvement: Not identified  Tumor Size: 0.8 cm  Histologic Type: Carcinoma, subtype cannot be determined; See comment  Histologic Grade: High-grade; see comment  Implants: Not identified  Other Tissue/ Organ Involvement: Not identified  Largest Extrapelvic Peritoneal Focus: < 1 mm  Peritoneal/Ascitic Fluid: Not applicable  Treatment Effect: Marked response with no or minimal residual cancer  (CRS 3)  Regional Lymph Nodes: No lymph nodes submitted or found  Pathologic Stage Classification (pTNM, AJCC 8th Edition): ypT3a, ypNX  Representative Tumor Block: A9  Comment(s): This case is being staged as if it were a high-grade serous  carcinoma.  Given that the morphologic and histologic assessment is  being rendered status post neoadjuvant therapy, the diagnostic features  of serous carcinoma are not readily apparent.  In fact, there are  scattered well-formed glandular elements within the foci of tumor. There  are other areas  with high-grade nuclei and scattered psammomatous  calcifications.  Residual tumor is present in both the right and left  ovaries with surface involvement.  The tumor foci in the right ovary  measures greater than 2 mm in maximum size.  However, the focus of  metastatic carcinoma in the omentum shows only a single focus which  measures less than 1 mm.  Based on this assessment, CRS 3 is assigned to  this case.    10/25/2019 Genetic Testing   Negative genetic testing:  No pathogenic variants detected on the [facility] CancerNext + RNAinsight panel. The report date is 10/25/2019.   The CancerNext gene panel offered by [employer] sequencing and rearrangement analysis for the following 36 genes:   APC*, ATM*, AXIN2, BARD1, BMPR1A, BRCA1*, BRCA2*, BRIP1*, CDH1*, CDK4, CDKN2A, CHEK2*, DICER1, HOXB13, EPCAM, GREM1, MLH1*, MSH2*, MSH3, MSH6*, MUTYH*, NBN, NF1*, NTHL1, PALB2*, PMS2*, POLD1, POLE, PTEN*, RAD51C*, RAD51D*, RECQL, SMAD4, SMARCA4, STK11, and TP53*. DNA and RNA analyses performed for * genes.     Interval History: Patient presents today to see me for the first time since her postoperative follow-up.  [place] Her germline genetic testing was negative for any mutations including BRCA mutation.  To my knowledge, she has not had somatic testing.  From what I am able to piece together from speaking with the patient and her daughter as well as the records available to me in epic, she was admitted a couple of  months ago for failure to thrive after being on a lap rib for approximately 2 months post surgery.  During that hospitalization, her hemoglobin was noted to be 5.8 and she received multiple units of packed red blood cells.  Her perp inhibitor and thyroid medication were held.  CT scan performed during that hospitalization showed no obvious evidence of disease recurrence.  She was treated for pneumonia with IV antibiotics.  The last  note I conceived from her medical oncologist was on 11/2.  Most recently, her hemoglobin was 6.8.  She received a blood transfusion here at  on the 10th.  Additionally, the patient developed a postoperative fluid collection/hematoma related to her incision from her large parastomal hernia repair.  Most recently, some of this fluid was drained percutaneously on 9/21.  Culture from that time showed staph aureus, and the patient was treated with antibiotics.  Today, she notes having some improvement in her energy since receiving blood transfusion.  She denies any vaginal bleeding or discharge since surgery.  She reports a good appetite without any nausea or emesis.  She still has some intermittent tenderness in the left lower quadrant.  She has intermittent constipation for which she uses MiraLAX.  She originally struggled with diarrhea when she was for started on olaparib.  She has some increased urinary frequency related to when she takes her Lasix.    She is seeing an endocrinologist in early January secondary to ongoing issues related to her thyroid.  Past Medical/Surgical History: Past Medical History:  Diagnosis Date  . Abdominal hernia   . Acute on chronic respiratory failure with hypoxia (HCC) 03/25/2019  . Anxiety   . Anxiety   . Atrial fibrillation (HCC) 03/24/2019  . Chest pressure 09/18/2015  . Community acquired bilateral lower lobe pneumonia 03/24/2019  . Complication of anesthesia   . COPD (chronic obstructive pulmonary disease) (HCC) 03/27/2019  . Diverticulitis   . Essential hypertension 09/18/2015  . Family history of breast cancer   . Family history of colon cancer   . Family history of melanoma   . Family history of prostate cancer   . GAD (generalized anxiety disorder) 12/01/2015  . Glaucoma   . Hypercholesteremia   . Hypertension   . Hyperthyroidism   . Hypokalemia 09/18/2015  . Iron deficiency anemia, unspecified   . Malignant neoplasm of ovary (HCC)   .  Ovarian cancer (HCC)   . Parkinson disease (HCC) 12/01/2015  . Primary insomnia 12/01/2015  . Skin cancer    squamous cell  . Weakness 09/18/2015    Past Surgical History:  Procedure Laterality Date  . BREAST LUMPECTOMY    . INCISIONAL HERNIA REPAIR N/A 08/15/2019   Procedure: HERNIA REPAIR INCISIONAL WITH MESH;  Surgeon: Ramirez, Armando, MD;  Location: [facility];  Service: General;  Laterality: N/A;  . LAPAROTOMY N/A 08/15/2019   Procedure: EXPLORATORY LAPAROTOMY;  Surgeon: Ramirez, Armando, MD;  Location: [facility];  Service: General;  Laterality: N/A;  REQUESTING RNFA  . LYSIS OF ADHESION N/A 08/15/2019   Procedure: LYSIS OF ADHESION;  Surgeon: Ramirez, Armando, MD;  Location: [facility];  Service: General;  Laterality: N/A;  . PARTIAL COLECTOMY     had partial colectomy for diverticular disease, required an ostomy followed by stoma revision  . right hand surgery    . squamous cell skin cancer resections     3    Family History  Problem Relation Age of Onset  . Stroke Mother   . Heart attack Father   .   Skin cancer Father        non-melanoma; dx. in his late 60s  . Breast cancer Sister 44  . Prostate cancer Brother 69       metastatic  . Colon cancer Brother 44  . Melanoma Brother   . Breast cancer Sister 44  . Breast cancer Niece 45  . Colon cancer Paternal Aunt        dx. in her 50s or 60s  . Cancer Paternal Uncle        mole on back?, dx. in his 60s  . Breast cancer Other 70       paternal great-aunt (grandmother's sister)  . Melanoma Brother 70  . Colon cancer Paternal Aunt        dx. older than 60  . Throat cancer Paternal Aunt        dx. in her 70s  . Breast cancer Cousin        dx. in her 60s or 70s (maternal first cousin)  . Breast cancer Cousin        dx. in her 60s or 70s (maternal first cousin)  . Breast cancer Cousin        dx. in her 80s (maternal first cousin)    Social History   Socioeconomic History  . Marital status: Married    Spouse name: Not on file   . Number of children: Not on file  . Years of education: Not on file  . Highest education level: Not on file  Occupational History  . Not on file  Tobacco Use  . Smoking status: Former Smoker  . Smokeless tobacco: Never Used  . Tobacco comment: Quit 29 years ago  Vaping Use  . Vaping Use: Never used  Substance and Sexual Activity  . Alcohol use: Never  . Drug use: Never  . Sexual activity: Not on file  Other Topics Concern  . Not on file  Social History Narrative  . Not on file   Social Determinants of Health   Financial Resource Strain: Not on file  Food Insecurity: Not on file  Transportation Needs: Not on file  Physical Activity: Not on file  Stress: Not on file  Social Connections: Not on file    Current Medications:  Current Outpatient Medications:  .  aspirin 81 MG EC tablet, Take by mouth., Disp: , Rfl:  .  carvedilol (COREG) 25 MG tablet, Take 25 mg by mouth 2 (two) times daily., Disp: , Rfl:  .  diltiazem (CARDIZEM CD) 120 MG 24 hr capsule, Take 120 mg by mouth daily., Disp: , Rfl:  .  DULoxetine (CYMBALTA) 60 MG capsule, Take 60 mg by mouth daily., Disp: , Rfl:  .  furosemide (LASIX) 40 MG tablet, Take 40 mg by mouth daily., Disp: , Rfl:  .  methimazole (TAPAZOLE) 5 MG tablet, Take 5 mg by mouth daily., Disp: , Rfl:  .  olaparib (LYNPARZA) 150 MG tablet, Take 2 tablets (300 mg total) by mouth 2 (two) times daily. Swallow whole. May take with food to decrease nausea and vomiting., Disp: 120 tablet, Rfl: 0 .  ondansetron (ZOFRAN) 4 MG tablet, Take 4 mg by mouth every 8 (eight) hours as needed for nausea or vomiting., Disp: , Rfl:  .  Travoprost, BAK Free, (TRAVATAN) 0.004 % SOLN ophthalmic solution, Place 1 drop into both eyes at bedtime. , Disp: , Rfl:  .  potassium chloride SA (KLOR-CON) 20 MEQ tablet, Take 20 mEq by mouth daily. (Patient not taking:   Reported on 02/09/2020), Disp: , Rfl:  .  rosuvastatin (CRESTOR) 10 MG tablet, Take 10 mg by mouth every Monday.   (Patient not taking: Reported on 02/09/2020), Disp: , Rfl:   Review of Systems: Pertinent positives as per HPI Denies appetite changes, fevers, chills, unexplained weight changes. Denies hearing loss, neck lumps or masses, mouth sores, ringing in ears or voice changes. Denies cough or wheezing.  Denies shortness of breath. Denies chest pain or palpitations. Denies leg swelling. Denies abdominal distention, pain, blood in stools, constipation, diarrhea, nausea, vomiting, or early satiety. Denies pain with intercourse, dysuria, frequency, hematuria or incontinence. Denies hot flashes, pelvic pain, vaginal bleeding or vaginal discharge.   Denies joint pain, back pain or muscle pain/cramps. Denies itching, rash, or wounds. Denies dizziness, headaches, numbness or seizures. Denies swollen lymph nodes or glands, denies easy bruising or bleeding. Denies anxiety, depression, confusion, or decreased concentration.  Physical Exam: BP (!) 150/52 (BP Location: Left Arm, Patient Position: Sitting)   Pulse 61   Temp 97.8 F (36.6 C) (Tympanic)   Resp 17   Ht 5' 2" (1.575 m)   Wt 134 lb 0.6 oz (60.8 kg)   SpO2 100%   BMI 24.52 kg/m  General: Alert, oriented, no acute distress.  Mild conjunctival pallor. HEENT: Normocephalic, atraumatic, sclera anicteric. Chest: Unlabored breathing on room air. Abdomen: soft, nontender.  Normoactive bowel sounds.  No hepatosplenomegaly appreciated.  Well-healed scar.  Some scar tissue noted in the left lower quadrant near prior ostomy site, no fluctuance or fluid collection appreciated. Extremities: Grossly normal range of motion.  Warm, well perfused.  No edema bilaterally. Skin: No rashes or lesions noted. Lymphatics: No cervical, supraclavicular, or inguinal adenopathy. GU: Normal appearing external genitalia without erythema, excoriation, or lesions.  Speculum exam reveals moderately atrophic vaginal mucosa.  Cuff intact with vaginal apices with some laxity  that extends past the central midline closure.  Bimanual exam reveals cuff intact, no nodularity or masses appreciated.  Rectovaginal exam confirms findings.  Laboratory & Radiologic Studies: 12/9   Ref Range & Units 10 d ago  Cancer Antigen (CA) 125 0.0 - 38.1 U/mL 8.4      Assessment & Plan: Megan Waller is a 79 y.o. 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  From a cancer standpoint, the patient Ca1 25 most recently was normal and she had a CT scan during her recent hospitalization that is reported to show no evidence of recurrent cancer.  I am concerned that she is having significant toxicity related to a lapper rib.  She has received multiple blood transfusions in the last few months.  Additionally, she has significant fatigue, likely multifactorial and related to intermittent periods of significant anemia as well as side effect induced by parp inhibitor.   Given the data that we have for maintenance Parp inhibitor in the frontline treatment, the patient would qualify for niraparib.  Given her significant toxicity, which I think may be completely or at least partially related to olaparib, I discussed with the patient and her daughter my recommendation to either transition to niraparib versus discontinuation of PARPi all together.    In terms of follow-up, we discussed surveillance visits every 3 months until the patient's is 2 years out from completion of   treatment.  At that time, if she remains disease-free, we can transition to visits every 4-6 months.  This note will be sent to the patient's medical oncologist and I am happy to alternate visits with him.  Given her difficulty over the last few months, we planned a visit in 3 months, and  then can transition back to alternate visits with Dr. Lewis.  42 minutes of total time was spent for this patient encounter, including preparation, face-to-face counseling with the patient and coordination of care, and documentation of the encounter.  Katherine Tucker, MD  Division of Gynecologic Oncology  Department of Obstetrics and Gynecology  [facility]   

## 2020-02-12 ENCOUNTER — Inpatient Hospital Stay (HOSPITAL_BASED_OUTPATIENT_CLINIC_OR_DEPARTMENT_OTHER): Payer: Medicare Other | Admitting: Gynecologic Oncology

## 2020-02-12 ENCOUNTER — Other Ambulatory Visit: Payer: Medicare Other

## 2020-02-12 ENCOUNTER — Other Ambulatory Visit: Payer: Self-pay

## 2020-02-12 VITALS — BP 150/52 | HR 61 | Temp 97.8°F | Resp 17 | Ht 62.0 in | Wt 134.0 lb

## 2020-02-12 DIAGNOSIS — C569 Malignant neoplasm of unspecified ovary: Secondary | ICD-10-CM | POA: Diagnosis not present

## 2020-02-12 DIAGNOSIS — C563 Malignant neoplasm of bilateral ovaries: Secondary | ICD-10-CM

## 2020-02-12 DIAGNOSIS — C786 Secondary malignant neoplasm of retroperitoneum and peritoneum: Secondary | ICD-10-CM

## 2020-02-12 DIAGNOSIS — Z9221 Personal history of antineoplastic chemotherapy: Secondary | ICD-10-CM | POA: Diagnosis not present

## 2020-02-12 DIAGNOSIS — I129 Hypertensive chronic kidney disease with stage 1 through stage 4 chronic kidney disease, or unspecified chronic kidney disease: Secondary | ICD-10-CM | POA: Diagnosis not present

## 2020-02-12 DIAGNOSIS — N189 Chronic kidney disease, unspecified: Secondary | ICD-10-CM | POA: Diagnosis not present

## 2020-02-12 DIAGNOSIS — D631 Anemia in chronic kidney disease: Secondary | ICD-10-CM | POA: Diagnosis not present

## 2020-02-12 NOTE — Patient Instructions (Signed)
I will reach out to Dr. Bobby Rumpf to talk about your medication and side effects from this.  I will see you in 3 months for another checkup.  Please call me if you develop any new symptoms before that.

## 2020-02-13 DIAGNOSIS — E0590 Thyrotoxicosis, unspecified without thyrotoxic crisis or storm: Secondary | ICD-10-CM | POA: Diagnosis not present

## 2020-02-23 DIAGNOSIS — I10 Essential (primary) hypertension: Secondary | ICD-10-CM | POA: Diagnosis not present

## 2020-02-25 ENCOUNTER — Other Ambulatory Visit: Payer: Self-pay | Admitting: Oncology

## 2020-02-25 DIAGNOSIS — C569 Malignant neoplasm of unspecified ovary: Secondary | ICD-10-CM

## 2020-02-25 NOTE — Progress Notes (Signed)
Megan Waller  [address] [place],  [place]  [address] [phone]  Clinic Day:  02/26/2020  Referring physician: Marco Collie, MD   HISTORY OF PRESENT ILLNESS:  The patient is a 80 y.o. female with with FIGO stage IVB ovarian cancer, which included peritoneal disease and a metastatic nodule over her left abdominal wall.  She underwent 6 cycles of carboplatin/paclitaxel before undergoing a TAHBSO/maximum debulking surgery, as well as abdominal hernia surgery, in August 2021.  Over the past 4 months, the patient has been taking olaparib to prevent early disease recurrence from her ovarian cancer.   She comes in today for routine follow-up.  Since her last visit, the patient has been doing okay.  She has been more fatigued recently.  In the past, she has required a blood transfusion for anemia.  She continues to deny have any overt forms of blood loss.  From an ovarian cancer standpoint, she denies having any new symptoms/findings which concern her for disease recurrence.     PHYSICAL EXAM:  Blood pressure (!) 166/72, pulse 71, temperature 97.8 F (36.6 C), resp. rate 14, height 5\' 2"  (1.575 m), weight 137 lb 9.6 oz (62.4 kg), SpO2 99 %. Wt Readings from Last 3 Encounters:  02/27/20 137 lb 1.9 oz (62.2 kg)  02/26/20 137 lb 9.6 oz (62.4 kg)  02/12/20 134 lb 0.6 oz (60.8 kg)   Body mass index is 25.17 kg/m. Performance status (ECOG): 1 Physical Exam Constitutional:      Appearance: Normal appearance.  HENT:     Mouth/Throat:     Pharynx: Oropharynx is clear. No oropharyngeal exudate.  Cardiovascular:     Rate and Rhythm: Normal rate and regular rhythm.     Heart sounds: No murmur heard. No friction rub. No gallop.   Pulmonary:     Breath sounds: Normal breath sounds.  Chest:  Breasts:     Right: No axillary adenopathy or supraclavicular adenopathy.     Left: No axillary adenopathy or supraclavicular adenopathy.    Abdominal:     General: Bowel  sounds are normal. There is no distension.     Palpations: Abdomen is soft. There is no mass.     Tenderness: There is no abdominal tenderness.  Musculoskeletal:        General: No tenderness.     Cervical back: Normal range of motion and neck supple.     Right lower leg: No edema.     Left lower leg: No edema.  Lymphadenopathy:     Cervical: No cervical adenopathy.     Right cervical: No superficial, deep or posterior cervical adenopathy.    Left cervical: No superficial, deep or posterior cervical adenopathy.     Upper Body:     Right upper body: No supraclavicular or axillary adenopathy.     Left upper body: No supraclavicular or axillary adenopathy.     Lower Body: No right inguinal adenopathy. No left inguinal adenopathy.  Skin:    Coloration: Skin is not jaundiced.     Findings: Lesion (subcutaneous area over her left abdomen is mildly draining.  The area conitnues to heal ) present. No rash.  Neurological:     General: No focal deficit present.     Mental Status: She is alert and oriented to person, place, and time. Mental status is at baseline.  Psychiatric:        Mood and Affect: Mood normal.        Behavior: Behavior normal.  Thought Content: Thought content normal.        Judgment: Judgment normal.     LABS:        ASSESSMENT & PLAN:  Assessment/Plan:  A 80 y.o. female with FIGO stage IVB ovarian cancer, status post 6 cycles of carboplatin/paclitaxel, followed by TAHBSO/maximum debulking surgery.  She is currently taking olaparib to decrease her chances of disease recurrence.  Once again, the patient is very anemic.  She is also leukopenic.  I believe her cytopenias are due to bone marrow suppression from her olaparib, which she is taking 300 mg BID.  I will arrange for her to be transfused 2 units of blood tomorrow.  Furthermore, I have told her to hold her olaparib for 1 week.  When she restarts it on Monday, January 10th, she knows to take 300 mg in the AM  and only 150 mg in the evening.  This equates to a 25% dose reduction, which will hopefully prevent additional cytopenias over time.  Of note, she is also on methimazole for hyperthyroidism, which also has marrow suppressive effects.  At this present time, it is difficult to determine which of the 2 drugs is playing a greater role in her low blood counts.  However, based upon her recent outside workup, she clearly has thyroid disease which needs to be addressed.  Her CA-125 level is pending; I will notify her of the results as they become available.  Otherwise, I will see her back in 1 month to reassess her peripheral counts.  The patient understands all the plans discussed today and is in agreement with them.     Moosa Bueche Macarthur Critchley, MD

## 2020-02-26 ENCOUNTER — Other Ambulatory Visit: Payer: Self-pay | Admitting: Hematology and Oncology

## 2020-02-26 ENCOUNTER — Inpatient Hospital Stay: Payer: Medicare Other | Attending: Oncology | Admitting: Oncology

## 2020-02-26 ENCOUNTER — Other Ambulatory Visit: Payer: Self-pay

## 2020-02-26 ENCOUNTER — Inpatient Hospital Stay: Payer: Medicare Other

## 2020-02-26 ENCOUNTER — Telehealth: Payer: Self-pay | Admitting: Oncology

## 2020-02-26 ENCOUNTER — Other Ambulatory Visit: Payer: Self-pay | Admitting: Oncology

## 2020-02-26 ENCOUNTER — Telehealth: Payer: Self-pay

## 2020-02-26 VITALS — BP 166/72 | HR 71 | Temp 97.8°F | Resp 14 | Ht 62.0 in | Wt 137.6 lb

## 2020-02-26 DIAGNOSIS — D649 Anemia, unspecified: Secondary | ICD-10-CM | POA: Insufficient documentation

## 2020-02-26 DIAGNOSIS — R5383 Other fatigue: Secondary | ICD-10-CM | POA: Insufficient documentation

## 2020-02-26 DIAGNOSIS — C786 Secondary malignant neoplasm of retroperitoneum and peritoneum: Secondary | ICD-10-CM | POA: Insufficient documentation

## 2020-02-26 DIAGNOSIS — C569 Malignant neoplasm of unspecified ovary: Secondary | ICD-10-CM | POA: Insufficient documentation

## 2020-02-26 DIAGNOSIS — E0590 Thyrotoxicosis, unspecified without thyrotoxic crisis or storm: Secondary | ICD-10-CM | POA: Insufficient documentation

## 2020-02-26 DIAGNOSIS — D638 Anemia in other chronic diseases classified elsewhere: Secondary | ICD-10-CM

## 2020-02-26 DIAGNOSIS — D72819 Decreased white blood cell count, unspecified: Secondary | ICD-10-CM | POA: Insufficient documentation

## 2020-02-26 LAB — COMPREHENSIVE METABOLIC PANEL
Albumin: 3.7 (ref 3.5–5.0)
Calcium: 8.5 — AB (ref 8.7–10.7)

## 2020-02-26 LAB — BASIC METABOLIC PANEL
BUN: 14 (ref 4–21)
CO2: 31 — AB (ref 13–22)
Chloride: 104 (ref 99–108)
Creatinine: 0.8 (ref 0.5–1.1)
Glucose: 109
Potassium: 3.7 (ref 3.4–5.3)
Sodium: 142 (ref 137–147)

## 2020-02-26 LAB — HEPATIC FUNCTION PANEL
ALT: 9 (ref 7–35)
AST: 18 (ref 13–35)
Alkaline Phosphatase: 77 (ref 25–125)
Bilirubin, Total: 0.7

## 2020-02-26 LAB — CBC AND DIFFERENTIAL
HCT: 20 — AB (ref 36–46)
Hemoglobin: 6.7 — AB (ref 12.0–16.0)
Neutrophils Absolute: 0.93
Platelets: 151 (ref 150–399)
WBC: 2.5

## 2020-02-26 LAB — PREPARE RBC (CROSSMATCH)

## 2020-02-26 LAB — CBC: RBC: 2.1 — AB (ref 3.87–5.11)

## 2020-02-26 MED ORDER — DIPHENHYDRAMINE HCL 25 MG PO CAPS: 25.0000 mg | ORAL_CAPSULE | Freq: Once | ORAL | Status: AC

## 2020-02-26 MED ORDER — HEPARIN SOD (PORK) LOCK FLUSH 100 UNIT/ML IV SOLN
250.0000 [IU] | INTRAVENOUS | Status: AC | PRN
  Filled 2020-02-26: qty 5

## 2020-02-26 MED ORDER — HEPARIN SOD (PORK) LOCK FLUSH 100 UNIT/ML IV SOLN
500.0000 [IU] | Freq: Every day | INTRAVENOUS | Status: AC | PRN
  Filled 2020-02-26: qty 5

## 2020-02-26 MED ORDER — SODIUM CHLORIDE 0.9% FLUSH
10.0000 mL | INTRAVENOUS | Status: AC | PRN
  Filled 2020-02-26: qty 10

## 2020-02-26 MED ORDER — SODIUM CHLORIDE 0.9% FLUSH
3.0000 mL | INTRAVENOUS | Status: AC | PRN
  Filled 2020-02-26: qty 10

## 2020-02-26 MED ORDER — ACETAMINOPHEN 325 MG PO TABS: 650.0000 mg | ORAL_TABLET | Freq: Once | ORAL | Status: AC

## 2020-02-26 MED ORDER — SODIUM CHLORIDE 0.9% IV SOLUTION
250.0000 mL | Freq: Once | INTRAVENOUS | Status: AC
  Filled 2020-02-26: qty 250

## 2020-02-26 NOTE — Telephone Encounter (Signed)
Hematology called with critical HGB. HGB 6.7. Dr. Bobby Rumpf and lab made aware.

## 2020-02-26 NOTE — Telephone Encounter (Signed)
I notified Dr Bobby Rumpf of pt's dtr request. He is in agreement for her to come in earlier for labs. I called [name] back, gave her lab appt for 1130. I notified Shay in lab of appt change.    Pt's daughter calling to ask for earlier appt. She feels her mom probably needs another blood transfusion, as she is weak. The 4 pm lab appt wouldn't allow much time to schedule blood transfusion if needed. [name] is willing to bring her mom in for earlier lab appt, then back to see Dr Bobby Rumpf @ 4 pm. Pt has been weak for 3 days, hypotension, achy. Denies fever, N/V, & diarrhea. Pt's dtr thinks her chemo needs to be decreased. Pt is taking olaparib 150 mg 2 caps po BID.  [phone]

## 2020-02-26 NOTE — Telephone Encounter (Signed)
Per 1/3 los next appt given to patient 

## 2020-02-27 ENCOUNTER — Inpatient Hospital Stay: Payer: Medicare Other

## 2020-02-27 ENCOUNTER — Other Ambulatory Visit: Payer: Self-pay

## 2020-02-27 DIAGNOSIS — C786 Secondary malignant neoplasm of retroperitoneum and peritoneum: Secondary | ICD-10-CM | POA: Diagnosis not present

## 2020-02-27 DIAGNOSIS — D72819 Decreased white blood cell count, unspecified: Secondary | ICD-10-CM | POA: Diagnosis not present

## 2020-02-27 DIAGNOSIS — D638 Anemia in other chronic diseases classified elsewhere: Secondary | ICD-10-CM

## 2020-02-27 DIAGNOSIS — R5383 Other fatigue: Secondary | ICD-10-CM | POA: Diagnosis not present

## 2020-02-27 DIAGNOSIS — D649 Anemia, unspecified: Secondary | ICD-10-CM | POA: Diagnosis not present

## 2020-02-27 DIAGNOSIS — C569 Malignant neoplasm of unspecified ovary: Secondary | ICD-10-CM | POA: Diagnosis not present

## 2020-02-27 DIAGNOSIS — E0590 Thyrotoxicosis, unspecified without thyrotoxic crisis or storm: Secondary | ICD-10-CM | POA: Diagnosis not present

## 2020-02-27 LAB — PREPARE RBC (CROSSMATCH)

## 2020-02-27 MED ORDER — SODIUM CHLORIDE 0.9% FLUSH
3.0000 mL | INTRAVENOUS | Status: DC | PRN
  Filled 2020-02-27: qty 10

## 2020-02-27 MED ORDER — ACETAMINOPHEN 325 MG PO TABS
ORAL_TABLET | ORAL | Status: AC
  Filled 2020-02-27: qty 2

## 2020-02-27 MED ORDER — SODIUM CHLORIDE 0.9% IV SOLUTION
250.0000 mL | Freq: Once | INTRAVENOUS | Status: AC
  Administered 2020-02-27: 250 mL via INTRAVENOUS
  Filled 2020-02-27: qty 250

## 2020-02-27 MED ORDER — ACETAMINOPHEN 325 MG PO TABS
650.0000 mg | ORAL_TABLET | Freq: Once | ORAL | Status: AC
  Administered 2020-02-27: 650 mg via ORAL
  Filled 2020-02-27: qty 2

## 2020-02-27 MED ORDER — SODIUM CHLORIDE 0.9% FLUSH
10.0000 mL | INTRAVENOUS | Status: DC | PRN
  Filled 2020-02-27: qty 10

## 2020-02-27 MED ORDER — DIPHENHYDRAMINE HCL 25 MG PO CAPS
ORAL_CAPSULE | ORAL | Status: AC
  Filled 2020-02-27: qty 1

## 2020-02-27 MED ORDER — HEPARIN SOD (PORK) LOCK FLUSH 100 UNIT/ML IV SOLN
500.0000 [IU] | Freq: Every day | INTRAVENOUS | Status: AC | PRN
  Administered 2020-02-27: 500 [IU]
  Filled 2020-02-27: qty 5

## 2020-02-27 MED ORDER — DIPHENHYDRAMINE HCL 25 MG PO CAPS
25.0000 mg | ORAL_CAPSULE | Freq: Once | ORAL | Status: AC
  Administered 2020-02-27: 25 mg via ORAL
  Filled 2020-02-27: qty 1

## 2020-02-27 NOTE — Patient Instructions (Signed)

## 2020-02-28 DIAGNOSIS — C569 Malignant neoplasm of unspecified ovary: Secondary | ICD-10-CM | POA: Diagnosis not present

## 2020-02-28 LAB — BPAM RBC
Blood Product Expiration Date: 202202032359
Blood Product Expiration Date: 202202032359
ISSUE DATE / TIME: 202201040732
ISSUE DATE / TIME: 202201040732
Unit Type and Rh: 5100
Unit Type and Rh: 5100

## 2020-02-28 LAB — TYPE AND SCREEN
ABO/RH(D): O POS
Antibody Screen: NEGATIVE
Unit division: 0
Unit division: 0

## 2020-03-01 ENCOUNTER — Telehealth: Payer: Self-pay

## 2020-03-01 ENCOUNTER — Other Ambulatory Visit: Payer: Self-pay

## 2020-03-01 ENCOUNTER — Other Ambulatory Visit: Payer: Self-pay | Admitting: Pharmacist

## 2020-03-01 ENCOUNTER — Ambulatory Visit (INDEPENDENT_AMBULATORY_CARE_PROVIDER_SITE_OTHER): Payer: Medicare Other | Admitting: Internal Medicine

## 2020-03-01 ENCOUNTER — Inpatient Hospital Stay: Payer: Medicare Other

## 2020-03-01 ENCOUNTER — Encounter: Payer: Self-pay | Admitting: Internal Medicine

## 2020-03-01 VITALS — BP 120/82 | HR 65 | Ht 62.0 in | Wt 137.2 lb

## 2020-03-01 DIAGNOSIS — E0590 Thyrotoxicosis, unspecified without thyrotoxic crisis or storm: Secondary | ICD-10-CM | POA: Diagnosis not present

## 2020-03-01 LAB — T4, FREE: Free T4: 0.73 ng/dL (ref 0.60–1.60)

## 2020-03-01 LAB — T3, FREE: T3, Free: 3.5 pg/mL (ref 2.3–4.2)

## 2020-03-01 LAB — TSH: TSH: 2.2 u[IU]/mL (ref 0.35–4.50)

## 2020-03-01 NOTE — Progress Notes (Addendum)
Patient ID: Megan Waller, female   DOB: April 28, 1940, 80 y.o.   MRN: [id number]   This visit occurred during the SARS-CoV-2 public health emergency.  Safety protocols were in place, including screening questions prior to the visit, additional usage of staff PPE, and extensive cleaning of exam room while observing appropriate contact time as indicated for disinfecting solutions.   HPI  Megan Waller is a 80 y.o.-year-old female, referred by her PCP, Dr. Nyra Capes, for evaluation and management of thyrotoxicosis.  She is here with her daughter who offers part of the history especially related to past medical history, current medications, and symptoms.   Patient has a long history of thyrotoxicosis (>10 years, unclear if Graves' disease), for which she was on methimazole for many years.  However, she developed significant anemia (hemoglobin 6-7) in 11/2019, so Methimazole was stopped (11/2019) b/c possible bone marrow suppression.    However, she has a significant history of ovarian cancer stage IV for which she started [place] in 10/2019 and, in retrospect, this may have been the cause for the bone marrow suppression.  Therefore, she was started back on methimazole in 12/2019, when a TSH returned suppressed. She was advised to start at 5 mg daily but she actually started 10 mg daily. She was referred to endocrinology for further evaluation.  She had a blood transfusion 2 days ago, 3 weeks before and one in 11/2019 while in the hospital.  Currently, off Lynparza x1 week, and will restart in 2 days at a lower dose.  She is now on: - Methimazole 5 mg daily - Coreg 25 mg 2x a day (chronic tx)  I reviewed pt's thyroid tests: 02/13/2020: (on MMI): TSH 2.2, free T4 0.94 01/03/2020: (off MMI): TSH 0.026, free T4 2.10 (0.82-1.77), WBC 3.8 (3.4-10.8), RBC 3.69 (3.77-5.20) platelets 239 (150-450) - after a blood transfusion 07/11/2019: TSH 0.011 (0.45-4.5) No results found for: TSH, FREET4,  T3FREE  Antithyroid antibodies: No results found for: TSI  Of note, she also has a history of thyroid nodule for which she had a remote benign thyroid biopsy.  Reviewed available imaging test results: CT chest with contrast (03/17/2019):  Pt denies: - feeling nodules in neck - hoarseness - dysphagia - choking - SOB with lying down  She denies: - excessive sweating/heat intolerance - tremors - palpitations - weight loss - hair loss  However, she complains of: - fatigue - constipation - anxiety  Pt does not have a FH of thyroid ds.: sister with thyroid ds (? Type). No FH of thyroid cancer. No h/o radiation tx to head or neck.  No seaweed or kelp, no recent contrast studies. + steroid use - with ChTx in the past. No herbal supplements. No Biotin use.  Pt. also has a history of COPD, paroxysmal A. fib, generalized anxiety disorder, glaucoma, hyperlipidemia, HTN.  ROS: Constitutional: + see HPI Eyes: no blurry vision, no xerophthalmia ENT: no sore throat, + see HPI, + decreased hearing Cardiovascular: no CP/SOB/palpitations/+ leg swelling Respiratory: no cough/SOB Gastrointestinal: no N/V/D/ + C Musculoskeletal: + Both: Muscle/joint aches Skin: no rashes Neurological: no tremors/numbness/tingling/dizziness Psychiatric: no depression/+ anxiety  Past Medical History:  Diagnosis Date  . Abdominal hernia   . Acute on chronic respiratory failure with hypoxia ([place]) 03/25/2019  . Anxiety   . Anxiety   . Atrial fibrillation ([place]) 03/24/2019  . Chest pressure 09/18/2015  . Community acquired bilateral lower lobe pneumonia 03/24/2019  . Complication of anesthesia   . COPD (chronic obstructive  pulmonary disease) ([place]) 03/27/2019  . Diverticulitis   . Essential hypertension 09/18/2015  . Family history of breast cancer   . Family history of colon cancer   . Family history of melanoma   . Family history of prostate cancer   . GAD (generalized anxiety disorder) 12/01/2015  .  Glaucoma   . Hypercholesteremia   . Hypertension   . Hyperthyroidism   . Hypokalemia 09/18/2015  . Iron deficiency anemia, unspecified   . Malignant neoplasm of ovary ([place])   . Ovarian cancer ([place])   . Parkinson disease ([place]) 12/01/2015  . Primary insomnia 12/01/2015  . Skin cancer    squamous cell  . Weakness 09/18/2015   Past Surgical History:  Procedure Laterality Date  . BREAST LUMPECTOMY    . INCISIONAL HERNIA REPAIR N/A 08/15/2019   Procedure: HERNIA REPAIR INCISIONAL WITH MESH;  Surgeon: [name], MD;  Location: [facility];  Service: General;  Laterality: N/A;  . LAPAROTOMY N/A 08/15/2019   Procedure: EXPLORATORY LAPAROTOMY;  Surgeon: [name], MD;  Location: [facility];  Service: General;  Laterality: N/A;  REQUESTING RNFA  . LYSIS OF ADHESION N/A 08/15/2019   Procedure: LYSIS OF ADHESION;  Surgeon: [name], MD;  Location: [facility];  Service: General;  Laterality: N/A;  . PARTIAL COLECTOMY     had partial colectomy for diverticular disease, required an ostomy followed by stoma revision  . right hand surgery    . squamous cell skin cancer resections     3   Social History   Socioeconomic History  . Marital status: Widowed    Spouse name: Not on file  . Number of children: 3  . Years of education: Not on file  . Highest education level: Not on file  Occupational History  . Not on file  Tobacco Use  . Smoking status: Former Research scientist (life sciences)  . Smokeless tobacco: Never Used  . Tobacco comment: Quit 29 years ago  Vaping Use  . Vaping Use: Never used  Substance and Sexual Activity  . Alcohol use: Never  . Drug use: Never  . Sexual activity: Not on file  Other Topics Concern  . Not on file  Social History Narrative  . Not on file   Social Determinants of Health   Financial Resource Strain: Not on file  Food Insecurity: Not on file  Transportation Needs: Not on file  Physical Activity: Not on file  Stress: Not on file  Social Connections: Not on file   Intimate Partner Violence: Not on file   Current Outpatient Medications on File Prior to Visit  Medication Sig Dispense Refill  . aspirin 81 MG EC tablet Take by mouth.    . carvedilol (COREG) 25 MG tablet Take 25 mg by mouth 2 (two) times daily.    Marland Kitchen diltiazem (CARDIZEM CD) 120 MG 24 hr capsule Take 120 mg by mouth daily.    . DULoxetine (CYMBALTA) 60 MG capsule Take 60 mg by mouth daily.    . furosemide (LASIX) 40 MG tablet Take 40 mg by mouth daily.    . methimazole (TAPAZOLE) 5 MG tablet Take 5 mg by mouth daily.    Marland Kitchen olaparib (LYNPARZA) 150 MG tablet Take 2 tablets (300 mg total) by mouth 2 (two) times daily. Swallow whole. May take with food to decrease nausea and vomiting. 120 tablet 0  . ondansetron (ZOFRAN) 4 MG tablet Take 4 mg by mouth every 8 (eight) hours as needed for nausea or vomiting.    . Travoprost,  BAK Free, (TRAVATAN) 0.004 % SOLN ophthalmic solution Place 1 drop into both eyes at bedtime.      Current Facility-Administered Medications on File Prior to Visit  Medication Dose Route Frequency Provider Last Rate Last Admin  . 0.9 %  sodium chloride infusion (Manually program via Guardrails IV Fluids)  250 mL Intravenous Once [place] Scrape A, NP      . acetaminophen (TYLENOL) tablet 650 mg  650 mg Oral Once [place] Scrape A, NP      . diphenhydrAMINE (BENADRYL) capsule 25 mg  25 mg Oral Once [place] Scrape A, NP      . heparin lock flush 100 unit/mL  500 Units Intracatheter Daily PRN [place] Scrape A, NP      . heparin lock flush 100 unit/mL  250 Units Intracatheter PRN [place] Scrape A, NP      . sodium chloride flush (NS) 0.9 % injection 10 mL  10 mL Intracatheter PRN [place] Scrape A, NP      . sodium chloride flush (NS) 0.9 % injection 3 mL  3 mL Intracatheter PRN [place] Scrape A, NP       Allergies  Allergen Reactions  . Ezetimibe-Simvastatin Other (See Comments)    Aching  . Lipitor [Atorvastatin]     Achy   Family History  Problem Relation Age  of Onset  . Stroke Mother   . Heart attack Father   . Skin cancer Father        non-melanoma; dx. in his late 31s  . Breast cancer Sister 27  . Prostate cancer Brother 60       metastatic  . Colon cancer Brother 28  . Melanoma Brother   . Breast cancer Sister 107  . Breast cancer Niece 64  . Colon cancer Paternal Aunt        dx. in her 87s or 50s  . Cancer Paternal Uncle        mole on back?, dx. in his 66s  . Breast cancer Other 47       paternal great-aunt (grandmother's sister)  . Melanoma Brother 69  . Colon cancer Paternal Aunt        dx. older than 68  . Throat cancer Paternal Aunt        dx. in her 23s  . Breast cancer Cousin        dx. in her 64s or 71s (maternal first cousin)  . Breast cancer Cousin        dx. in her 88s or 87s (maternal first cousin)  . Breast cancer Cousin        dx. in her 9s (maternal first cousin)   PE: BP 120/82   Pulse 65   Ht 5\' 2"  (1.575 m)   Wt 137 lb 3.2 oz (62.2 kg)   SpO2 99%   BMI 25.09 kg/m  Wt Readings from Last 3 Encounters:  03/01/20 137 lb 3.2 oz (62.2 kg)  02/27/20 137 lb 1.9 oz (62.2 kg)  02/26/20 137 lb 9.6 oz (62.4 kg)   Constitutional: normal weight, in NAD Eyes: PERRLA, EOMI, no exophthalmos, no lid lag, no stare ENT: moist mucous membranes, no thyromegaly, no thyroid nodules felt in her neck, no thyroid bruits, no cervical lymphadenopathy Cardiovascular: RRR, No MRG Respiratory: CTA B Gastrointestinal: abdomen soft, NT, ND, BS+ Musculoskeletal: + deformities (finger missing right hand), strength intact in all 4 Skin: moist, warm, no rashes Neurological: no tremor with outstretched hands, DTR normal in all 4  ASSESSMENT:  1. Thyrotoxicosis  2. Thyroid nodule  PLAN:  1. Patient with a recently found low TSH, without current thyrotoxic sxs: weight loss, heat intolerance, hyperdefecation, palpitations, but does have anxiety and had loose stools before restarting methimazole - she does not appear to have  exogenous causes for the low TSH.  - We discussed that possible causes of thyrotoxicosis are:  Marland Kitchen Graves ds (most likely diagnosis) . Thyroiditis (less likely due to long course and response to methimazole) . toxic multinodular goiter/ toxic adenoma (she does have a history of thyroid nodule which was apparently biopsied with benign results in the past) - will check the TSH, fT3 and fT4 and also add thyroid stimulating antibodies to screen for Graves' disease.  - I did discuss with the patient and her daughter that we may need an uptake and scan to differentiate between the 3 above possible etiologies, however, they would like to defer this for now, in the light of current treatment for anemia and ovarian cancer. - we discussed about possible modalities of treatment for the above conditions, to include methimazole use, radioactive iodine ablation or (last resort) surgery.  We discussed about side effects of methimazole to include aplastic anemia, however, this is not consistent with patient's presentation.  Rather, [place] is known to cause a myelodysplastic picture and it is most likely the cause for her bone marrow suppression.  We did discuss, however, if the anemia is not improving after stopping the methimazole for a week and then restarted at 25% lower dose, we may need to reevaluate and possibly proceed with RAI treatment.  I am worried, however, that this may cause more bone marrow suppression than the low-dose methimazole.  I do not feel that she is a good candidate for surgery right now due to the other comorbidities.  Patient and daughter agree with the plan. - she continues on the beta blocker (Coreg 25 mg twice a day, which is chronic treatment for her, in the setting of HTN) - no signs of Graves' ophthalmopathy: she does not have any double vision, blurry vision, eye pain, chemosis. - I advised her to join my chart to communicate easier, but they preferred to be called with results - RTC in  3-4 months, but likely sooner for repeat labs  2. Thyroid Nodule -Per review part of her CT scan from her year last year, she has a large, 3.5 x 3.4 cm thyroid nodule, with calcifications.  Reportedly, this nodule has been biopsied with benign results in the past -She denies neck compression symptoms -We will continue to follow her clinically for now -I would like to obtain the records of previous investigation and FNA.  Patient signed a release of information today to obtain the results from Dr. Nyra Capes.  Component     Latest Ref Rng & Units 03/01/2020  TSH     0.35 - 4.50 uIU/mL 2.20  T4,Free(Direct)     0.60 - 1.60 ng/dL 0.73  Triiodothyronine,Free,Serum     2.3 - 4.2 pg/mL 3.5   Thyroid tests remain normal on low-dose methimazole.  Component     Latest Ref Rng & Units 03/01/2020  TSI     <140 % baseline <89   TSI's are not elevated.  Addendum (03/15/2020): Received records from Dr. Nyra Capes.  However, there is no further information about previous thyroid disease beyond the fact that she has a distant, reportedly benign, thyroid biopsy.  Philemon Kingdom, MD PhD [facility] Endocrinology

## 2020-03-01 NOTE — Telephone Encounter (Signed)
Spoke with Dr. Bobby Rumpf about her labs and called [name] back. Since she is holding her medication and had the recent blood transfusion, she will not need her injection appointment today. [name] acknowledged and will call her back with results of [place]'s CA markers. Spoke with Dawn at infusion center and cancelled appt.

## 2020-03-01 NOTE — Patient Instructions (Signed)
Please stop at the lab.  For now, please continue Methimazole 5 mg daily.  Please return in 3-4 months.

## 2020-03-01 NOTE — Telephone Encounter (Signed)
[name] called for [facility], asked for results from lab work and if she still needs to come to her 2pm appt.

## 2020-03-04 LAB — THYROID STIMULATING IMMUNOGLOBULIN: TSI: <89 % baseline (ref <140)

## 2020-03-06 ENCOUNTER — Encounter: Payer: Self-pay | Admitting: Oncology

## 2020-03-06 ENCOUNTER — Telehealth: Payer: Self-pay

## 2020-03-14 DIAGNOSIS — Z20828 Contact with and (suspected) exposure to other viral communicable diseases: Secondary | ICD-10-CM | POA: Diagnosis not present

## 2020-03-14 DIAGNOSIS — U071 COVID-19: Secondary | ICD-10-CM | POA: Diagnosis not present

## 2020-03-22 ENCOUNTER — Other Ambulatory Visit: Payer: Self-pay | Admitting: Hematology and Oncology

## 2020-03-22 ENCOUNTER — Telehealth: Payer: Self-pay

## 2020-03-22 DIAGNOSIS — C569 Malignant neoplasm of unspecified ovary: Secondary | ICD-10-CM

## 2020-03-22 NOTE — Telephone Encounter (Addendum)
I called pt, and notified her to stop the olaparib, beginning with tonight's dose. Pt verbalized understanding. [phone]   Mosher, Vida Roller A, PA-C  You 1 hour ago (9:59 AM)   KM  I'm not clear on when she was diagnosed with COVID, we should wait 21 days after date of diagnosis to see her, just to be sure. I would recommned she hold the olaparib until she recovers because can affect the WBC's too. She should probably be seen on 2/14 if she is not scheduled already. Thanks!   Message text       I called pt to see of she has restarted the olaparib. She states she restarted a few days after it was held- due to hgb 6.7 (received 2 u PRBCs). The lab recheck was to be on 03/25/2020. However, she has developed COVID since then, & appt's have been moved to 04/08/20.     ----- Message from Marvia Pickles, PA-C sent at 03/21/2020  5:23 PM EST ----- Regarding: RE: orders for 03/25/20 I'm sorry, I don't know why she is coming on 1/31. Can you check with Dr. Bobby Rumpf?  ----- Message ----- From: Dairl Ponder, RN Sent: 03/21/2020   4:45 PM EST To: Marvia Pickles, PA-C Subject: orders for 03/25/20                             Can you please check and make sure pt has lab orders for 03/25/20? Thanks!

## 2020-03-25 ENCOUNTER — Inpatient Hospital Stay: Payer: Medicare Other

## 2020-03-25 DIAGNOSIS — I10 Essential (primary) hypertension: Secondary | ICD-10-CM | POA: Diagnosis not present

## 2020-03-26 DIAGNOSIS — E0590 Thyrotoxicosis, unspecified without thyrotoxic crisis or storm: Secondary | ICD-10-CM | POA: Diagnosis not present

## 2020-03-26 DIAGNOSIS — F419 Anxiety disorder, unspecified: Secondary | ICD-10-CM | POA: Diagnosis not present

## 2020-03-26 DIAGNOSIS — D649 Anemia, unspecified: Secondary | ICD-10-CM | POA: Diagnosis not present

## 2020-03-26 DIAGNOSIS — I10 Essential (primary) hypertension: Secondary | ICD-10-CM | POA: Diagnosis not present

## 2020-03-26 DIAGNOSIS — Z6823 Body mass index (BMI) 23.0-23.9, adult: Secondary | ICD-10-CM | POA: Diagnosis not present

## 2020-03-26 DIAGNOSIS — F339 Major depressive disorder, recurrent, unspecified: Secondary | ICD-10-CM | POA: Diagnosis not present

## 2020-04-02 DIAGNOSIS — C4490 Unspecified malignant neoplasm of skin, unspecified: Secondary | ICD-10-CM | POA: Insufficient documentation

## 2020-04-02 DIAGNOSIS — D509 Iron deficiency anemia, unspecified: Secondary | ICD-10-CM | POA: Insufficient documentation

## 2020-04-02 DIAGNOSIS — T8859XA Other complications of anesthesia, initial encounter: Secondary | ICD-10-CM | POA: Insufficient documentation

## 2020-04-02 DIAGNOSIS — K5792 Diverticulitis of intestine, part unspecified, without perforation or abscess without bleeding: Secondary | ICD-10-CM | POA: Insufficient documentation

## 2020-04-02 DIAGNOSIS — C569 Malignant neoplasm of unspecified ovary: Secondary | ICD-10-CM | POA: Insufficient documentation

## 2020-04-02 DIAGNOSIS — K469 Unspecified abdominal hernia without obstruction or gangrene: Secondary | ICD-10-CM | POA: Insufficient documentation

## 2020-04-02 DIAGNOSIS — I10 Essential (primary) hypertension: Secondary | ICD-10-CM | POA: Insufficient documentation

## 2020-04-02 DIAGNOSIS — E7800 Pure hypercholesterolemia, unspecified: Secondary | ICD-10-CM | POA: Insufficient documentation

## 2020-04-04 ENCOUNTER — Other Ambulatory Visit: Payer: Self-pay

## 2020-04-04 ENCOUNTER — Ambulatory Visit (INDEPENDENT_AMBULATORY_CARE_PROVIDER_SITE_OTHER): Payer: Medicare Other | Admitting: Cardiology

## 2020-04-04 ENCOUNTER — Encounter: Payer: Self-pay | Admitting: Cardiology

## 2020-04-04 VITALS — BP 140/68 | HR 72 | Ht 62.0 in | Wt 133.4 lb

## 2020-04-04 DIAGNOSIS — E782 Mixed hyperlipidemia: Secondary | ICD-10-CM | POA: Diagnosis not present

## 2020-04-04 DIAGNOSIS — I10 Essential (primary) hypertension: Secondary | ICD-10-CM

## 2020-04-04 DIAGNOSIS — I480 Paroxysmal atrial fibrillation: Secondary | ICD-10-CM | POA: Diagnosis not present

## 2020-04-04 NOTE — Patient Instructions (Signed)
Medication Instructions:  Your physician has recommended you make the following change in your medication:  STOP: Coreg DECREASE: Lasix 20 mg every other day It is okay to take half Losartan in the morning and half at night.   *If you need a refill on your cardiac medications before your next appointment, please call your pharmacy*   Lab Work: None If you have labs (blood work) drawn today and your tests are completely normal, you will receive your results only by: Marland Kitchen MyChart Message (if you have MyChart) OR . A paper copy in the mail If you have any lab test that is abnormal or we need to change your treatment, we will call you to review the results.   Testing/Procedures: None   Follow-Up: At [facility], you and your health needs are our priority.  As part of our continuing mission to provide you with exceptional heart care, we have created designated Provider Care Teams.  These Care Teams include your primary Cardiologist (physician) and Advanced Practice Providers (APPs -  Physician Assistants and Nurse Practitioners) who all work together to provide you with the care you need, when you need it.  We recommend signing up for the patient portal called "MyChart".  Sign up information is provided on this After Visit Summary.  MyChart is used to connect with patients for Virtual Visits (Telemedicine).  Patients are able to view lab/test results, encounter notes, upcoming appointments, etc.  Non-urgent messages can be sent to your provider as well.   To learn more about what you can do with MyChart, go to [email or web address].    Your next appointment:   3 month(s)  The format for your next appointment:   In Person  Provider:   Berniece Salines, DO   Other Instructions

## 2020-04-04 NOTE — Progress Notes (Signed)
Cardiology Office Note:    Date:  04/04/2020   ID:  Megan Waller, DOB 07/18/1940, MRN [id number]  PCP:  Marco Collie, MD  Cardiologist:  Berniece Salines, DO  Electrophysiologist:  None   Referring MD: Marco Collie, MD   I had COVID recently  History of Present Illness:    Megan Waller is a 80 y.o. female with a hx of stage IV ovarian cancer status post chemotherapy then recurrences of cancer now she has received 1 of 3 cycles of carboplatin/paclitaxel chemotherapy,atrial fibrillationnot currently on anticoagulation takes aspirin daily, hypertension, hyperlipidemia, anxiety.   I last saw the patient on 06/08/2019 at time sheshe was planning surgery. At that time she had not interested labs and therefore went home for her surgery.  She has completed 3 cycles of chemotherapy and is now ready for surgical procedure.  I saw the patient on August 01, 2019 at that time she was scheduled for atotal hysterectomy (removal of uterus or cervix) either robotically or open, bilateral salpingo-oophorectomy, omentectomy, tumor debulking, hernia repair with Dr. Marney Doctor June 22,2021.  She was cleared from a cardiovascular standpoint.  Since I last saw the patient she has had COVID-19 infection.  But she is recovering well.  She also has some episodes of hypotension , her Coreg was held.  Past Medical History:  Diagnosis Date  . Abdominal hernia   . Acute on chronic respiratory failure with hypoxia ([place]) 03/25/2019  . Anxiety   . Anxiety   . Atrial fibrillation ([place]) 03/24/2019  . Chest pressure 09/18/2015  . Community acquired bilateral lower lobe pneumonia 03/24/2019  . Complication of anesthesia   . COPD (chronic obstructive pulmonary disease) ([place]) 03/27/2019  . Diverticulitis   . Essential hypertension 09/18/2015  . Family history of breast cancer   . Family history of colon cancer   . Family history of melanoma   . Family history of prostate cancer   . GAD  (generalized anxiety disorder) 12/01/2015  . Glaucoma   . Hypercholesteremia   . Hypertension   . Hyperthyroidism   . Hypokalemia 09/18/2015  . Iron deficiency anemia, unspecified   . Malignant neoplasm of ovary ([place])   . Ovarian cancer ([place])   . Parkinson disease ([place]) 12/01/2015  . Primary insomnia 12/01/2015  . Skin cancer    squamous cell  . Weakness 09/18/2015    Past Surgical History:  Procedure Laterality Date  . BREAST LUMPECTOMY    . INCISIONAL HERNIA REPAIR N/A 08/15/2019   Procedure: HERNIA REPAIR INCISIONAL WITH MESH;  Surgeon: [name], MD;  Location: [facility];  Service: General;  Laterality: N/A;  . LAPAROTOMY N/A 08/15/2019   Procedure: EXPLORATORY LAPAROTOMY;  Surgeon: [name], MD;  Location: [facility];  Service: General;  Laterality: N/A;  REQUESTING RNFA  . LYSIS OF ADHESION N/A 08/15/2019   Procedure: LYSIS OF ADHESION;  Surgeon: [name], MD;  Location: [facility];  Service: General;  Laterality: N/A;  . PARTIAL COLECTOMY     had partial colectomy for diverticular disease, required an ostomy followed by stoma revision  . right hand surgery    . squamous cell skin cancer resections     3    Current Medications: Current Meds  Medication Sig  . aspirin 81 MG EC tablet Take by mouth.  . diltiazem (CARDIZEM CD) 120 MG 24 hr capsule Take 120 mg by mouth daily.  . DULoxetine (CYMBALTA) 60 MG capsule Take 60 mg by mouth daily.  . furosemide (LASIX)  40 MG tablet Take 40 mg by mouth daily.  Marland Kitchen losartan (COZAAR) 25 MG tablet Take 25 mg by mouth daily. Takes half a tablet at bedtime  . methimazole (TAPAZOLE) 5 MG tablet Take 5 mg by mouth daily.  Marland Kitchen olaparib (LYNPARZA) 150 MG tablet Take 2 tablets (300 mg total) by mouth 2 (two) times daily. Swallow whole. May take with food to decrease nausea and vomiting.  . ondansetron (ZOFRAN) 4 MG tablet Take 4 mg by mouth every 8 (eight) hours as needed for nausea or vomiting.  . Travoprost, BAK Free, (TRAVATAN) 0.004 %  SOLN ophthalmic solution Place 1 drop into both eyes at bedtime.   . [DISCONTINUED] carvedilol (COREG) 25 MG tablet Take 25 mg by mouth 2 (two) times daily.     Allergies:   Ezetimibe-simvastatin and Lipitor [atorvastatin]   Social History   Socioeconomic History  . Marital status: Widowed    Spouse name: Not on file  . Number of children: 3  . Years of education: Not on file  . Highest education level: Not on file  Occupational History  . Not on file  Tobacco Use  . Smoking status: Former Research scientist (life sciences)  . Smokeless tobacco: Never Used  . Tobacco comment: Quit 29 years ago  Vaping Use  . Vaping Use: Never used  Substance and Sexual Activity  . Alcohol use: Never  . Drug use: Never  . Sexual activity: Not on file  Other Topics Concern  . Not on file  Social History Narrative  . Not on file   Social Determinants of Health   Financial Resource Strain: Not on file  Food Insecurity: Not on file  Transportation Needs: Not on file  Physical Activity: Not on file  Stress: Not on file  Social Connections: Not on file     Family History: The patient's family history includes Breast cancer in her cousin, cousin, and cousin; Breast cancer (age of onset: 46) in her sister and sister; Breast cancer (age of onset: 24) in her niece; Breast cancer (age of onset: 24) in an other family member; Cancer in her paternal uncle; Colon cancer in her paternal aunt and paternal aunt; Colon cancer (age of onset: 37) in her brother; Heart attack in her father; Melanoma in her brother; Melanoma (age of onset: 13) in her brother; Prostate cancer (age of onset: 62) in her brother; Skin cancer in her father; Stroke in her mother; Throat cancer in her paternal aunt.  ROS:   Review of Systems  Constitution: Negative for decreased appetite, fever and weight gain.  HENT: Negative for congestion, ear discharge, hoarse voice and sore throat.   Eyes: Negative for discharge, redness, vision loss in right eye and  visual halos.  Cardiovascular: Negative for chest pain, dyspnea on exertion, leg swelling, orthopnea and palpitations.  Respiratory: Negative for cough, hemoptysis, shortness of breath and snoring.   Endocrine: Negative for heat intolerance and polyphagia.  Hematologic/Lymphatic: Negative for bleeding problem. Does not bruise/bleed easily.  Skin: Negative for flushing, nail changes, rash and suspicious lesions.  Musculoskeletal: Negative for arthritis, joint pain, muscle cramps, myalgias, neck pain and stiffness.  Gastrointestinal: Negative for abdominal pain, bowel incontinence, diarrhea and excessive appetite.  Genitourinary: Negative for decreased libido, genital sores and incomplete emptying.  Neurological: Negative for brief paralysis, focal weakness, headaches and loss of balance.  Psychiatric/Behavioral: Negative for altered mental status, depression and suicidal ideas.  Allergic/Immunologic: Negative for HIV exposure and persistent infections.    EKGs/Labs/Other Studies Reviewed:  The following studies were reviewed today:   EKG:  The ekg ordered today demonstrates   Pharmacologic stress testperformed in 2017 via [facility]: No reversible ischemia or infarction. No left ventricular wall motion. Left ventricle ejection fraction 82%. Normal study.   [facility] at [facility] 1/2021FINDINGS Procedure:2D images, m-mode, color and spectral Doppler were obtained and reviewed. ECG rhythm:Sinus rhythm. Study quality:This was a technically adequate study to a technically difficult study. Left Ventricle:The left ventricular size is normal. There is mild concentric left ventricular hypertrophy. There is normal global left ventricular contractility. Overall left ventricular systolic function is normal with, an EF between 60 - 65 %. The diastolic filling pattern is normal for the age of the patient. No regional wall motion abnormalities were noted. Right  Ventricle:The right ventricle is normal in size and function. The RV was not well visualized. Left Atrium:Left atrium is mildly dilated by volume. Right Atrium:The right atrium is normal in size and function. The right atrium was not well visualized. ASD/VSD:Interatrial and interventricular septum intact. Aortic Valve:The aortic valve is trileaflet, and appears structurally normal. No aortic stenosis or regurgitation. Mitral Valve:Normal appearing mitral valve. There is trace mitral regurgitation. Tricuspid Valve:The tricuspid valve appears structurally normal. Mild tricuspid regurgitation present. The right ventricular systolic pressure, as measured by Doppler, is 31 mmhg. Pulmonic Valve:The pulmonic valve is normal. Trace/mild (physiologic) pulmonic regurgitation. Aorta:The aortic root, ascending aorta and aortic arch appear normal. AST:MHDQQI inferior vena cava with normal inspiratory collapse. Pulmonary Veins:The flow patterns, measured by Doppler, appear normal. Pericardium:The pericardium is normal. There is no pericardial effusion.  CONCLUSIONS ----------- 1. There is mild concentric left ventricular hypertrophy. 2. There is normal global left ventricular contractility. 3. Overall left ventricular systolic function is normal with, an EF between 60 - 65 %. 4. The diastolic filling pattern is normal for the age of the patient. 5. No regional wall motion abnormalities were noted. 6. The right ventricle is normal in size and function. 7. Left atrium is mildly dilated by volume. 8. The pericardium is normal.  [facility] 1. Interval reduction in size of bilateral ovarian masses, ovaries now measuring within normal limits, essentially complete resolution of previously seen extensive metastatic omental and peritoneal nodularity about the abdomen and pelvis, and resolution of bilateral inguinal lymphadenopathy. Findings are consistent  with treatment response, without CT evidence of residual disease in the abdomen or pelvis. 2. Cholelithiasis. 3. Parastomal hernia of the left lower quadrant containing nonobstructed loops of transverse colon and mid small bowel. 4. Aortic Atherosclerosis (ICD10-I70.0).  Recent Labs: 02/26/2020: ALT 9; BUN 14; Creatinine 0.8; Hemoglobin 6.7; Platelets 151; Potassium 3.7; Sodium 142 03/01/2020: TSH 2.20  Recent Lipid Panel No results found for: CHOL, TRIG, HDL, CHOLHDL, VLDL, LDLCALC, LDLDIRECT  Physical Exam:    VS:  BP 140/68   Pulse 72   Ht 5\' 2"  (1.575 m)   Wt 133 lb 6.4 oz (60.5 kg)   SpO2 96%   BMI 24.40 kg/m     Wt Readings from Last 3 Encounters:  04/04/20 133 lb 6.4 oz (60.5 kg)  03/01/20 137 lb 3.2 oz (62.2 kg)  02/27/20 137 lb 1.9 oz (62.2 kg)     GEN: Well nourished, well developed in no acute distress HEENT: Normal NECK: No JVD; No carotid bruits LYMPHATICS: No lymphadenopathy CARDIAC: S1S2 noted,RRR, no murmurs, rubs, gallops RESPIRATORY:  Clear to auscultation without rales, wheezing or rhonchi  ABDOMEN: Soft, non-tender, non-distended, +bowel sounds, no guarding. EXTREMITIES: No edema, No  cyanosis, no clubbing MUSCULOSKELETAL:  No deformity  SKIN: Warm and dry NEUROLOGIC:  Alert and oriented x 3, non-focal PSYCHIATRIC:  Normal affect, good insight  ASSESSMENT:    1. PAF (paroxysmal atrial fibrillation) ([place])   2. Essential hypertension   3. Mixed hyperlipidemia    PLAN:     I am fine with stopping her carvedilol at this time.  We will cut back her Lasix to 20 mg every other day.  She will remain on her Cardizem 120 mg daily.  Losartan 25 mg daily however we can split the pill in half take one half in the morning and one half at night.  She is not on anticoagulation for atrial fibrillation she prefers to stay on aspirin however continue to monitor the patient.  She follows her oncologist as well as her endocrinologist.  The patient is in agreement  with the above plan. The patient left the office in stable condition.  The patient will follow up in 3 months due to medication change.   Medication Adjustments/Labs and Tests Ordered: Current medicines are reviewed at length with the patient today.  Concerns regarding medicines are outlined above.  No orders of the defined types were placed in this encounter.  No orders of the defined types were placed in this encounter.   There are no Patient Instructions on file for this visit.   Adopting a Healthy Lifestyle.  Know what a healthy weight is for you (roughly BMI <25) and aim to maintain this   Aim for 7+ servings of fruits and vegetables daily   65-80+ fluid ounces of water or unsweet tea for healthy kidneys   Limit to max 1 drink of alcohol per day; avoid smoking/tobacco   Limit animal fats in diet for cholesterol and heart health - choose grass fed whenever available   Avoid highly processed foods, and foods high in saturated/trans fats   Aim for low stress - take time to unwind and care for your mental health   Aim for 150 min of moderate intensity exercise weekly for heart health, and weights twice weekly for bone health   Aim for 7-9 hours of sleep daily   When it comes to diets, agreement about the perfect plan isnt easy to find, even among the experts. Experts at the [employer] developed an idea known as the Healthy Eating Plate. Just imagine a plate divided into logical, healthy portions.   The emphasis is on diet quality:   Load up on vegetables and fruits - one-half of your plate: Aim for color and variety, and remember that potatoes dont count.   Go for whole grains - one-quarter of your plate: Whole wheat, barley, wheat berries, quinoa, oats, brown rice, and foods made with them. If you want pasta, go with whole wheat pasta.   Protein power - one-quarter of your plate: Fish, chicken, beans, and nuts are all healthy, versatile protein  sources. Limit red meat.   The diet, however, does go beyond the plate, offering a few other suggestions.   Use healthy plant oils, such as olive, canola, soy, corn, sunflower and peanut. Check the labels, and avoid partially hydrogenated oil, which have unhealthy trans fats.   If youre thirsty, drink water. Coffee and tea are good in moderation, but skip sugary drinks and limit milk and dairy products to one or two daily servings.   The type of carbohydrate in the diet is more important than the amount. Some sources of carbohydrates,  such as vegetables, fruits, whole grains, and beans-are healthier than others.   Finally, stay active  Signed, Berniece Salines, DO  04/04/2020 4:53 PM    [facility]

## 2020-04-08 ENCOUNTER — Telehealth: Payer: Self-pay | Admitting: Oncology

## 2020-04-08 ENCOUNTER — Other Ambulatory Visit: Payer: Self-pay | Admitting: Hematology and Oncology

## 2020-04-08 ENCOUNTER — Inpatient Hospital Stay: Payer: Medicare Other

## 2020-04-08 ENCOUNTER — Other Ambulatory Visit: Payer: Self-pay | Admitting: Oncology

## 2020-04-08 ENCOUNTER — Inpatient Hospital Stay: Payer: Medicare Other | Attending: Oncology | Admitting: Oncology

## 2020-04-08 ENCOUNTER — Other Ambulatory Visit: Payer: Self-pay

## 2020-04-08 VITALS — BP 210/85 | HR 71 | Temp 98.0°F | Resp 14 | Ht 62.0 in | Wt 134.2 lb

## 2020-04-08 DIAGNOSIS — C569 Malignant neoplasm of unspecified ovary: Secondary | ICD-10-CM | POA: Diagnosis not present

## 2020-04-08 DIAGNOSIS — D649 Anemia, unspecified: Secondary | ICD-10-CM | POA: Diagnosis not present

## 2020-04-08 LAB — BASIC METABOLIC PANEL
BUN: 12 (ref 4–21)
CO2: 32 — AB (ref 13–22)
Chloride: 104 (ref 99–108)
Creatinine: 0.7 (ref 0.5–1.1)
Glucose: 108
Potassium: 3.5 (ref 3.4–5.3)
Sodium: 142 (ref 137–147)

## 2020-04-08 LAB — COMPREHENSIVE METABOLIC PANEL
Albumin: 3.9 (ref 3.5–5.0)
Calcium: 8.7 (ref 8.7–10.7)

## 2020-04-08 LAB — HEPATIC FUNCTION PANEL
ALT: 12 (ref 7–35)
AST: 20 (ref 13–35)
Alkaline Phosphatase: 93 (ref 25–125)
Bilirubin, Total: 0.6

## 2020-04-08 LAB — CBC AND DIFFERENTIAL
HCT: 30 — AB (ref 36–46)
Hemoglobin: 9.7 — AB (ref 12.0–16.0)
Neutrophils Absolute: 1.64
Platelets: 236 (ref 150–399)
WBC: 4.1

## 2020-04-08 LAB — CBC: RBC: 2.9 — AB (ref 3.87–5.11)

## 2020-04-08 NOTE — Telephone Encounter (Signed)
Per 2/14 LOS, patient scheduled for March Appt's.  Per patient, Dr Bobby Rumpf stated she does not need the 2/16 Injection (Canceled). Gave patient Appt Summary

## 2020-04-09 LAB — CA 125: Cancer Antigen (CA) 125: 9.9 U/mL (ref 0.0–38.1)

## 2020-04-09 NOTE — Addendum Note (Signed)
Addended by: Juanetta Beets on: 04/09/2020 01:24 PM   Modules accepted: Orders

## 2020-04-10 ENCOUNTER — Inpatient Hospital Stay: Payer: Medicare Other

## 2020-04-10 DIAGNOSIS — I4891 Unspecified atrial fibrillation: Secondary | ICD-10-CM | POA: Diagnosis not present

## 2020-04-10 DIAGNOSIS — C569 Malignant neoplasm of unspecified ovary: Secondary | ICD-10-CM | POA: Diagnosis not present

## 2020-04-10 DIAGNOSIS — I10 Essential (primary) hypertension: Secondary | ICD-10-CM | POA: Diagnosis not present

## 2020-04-10 DIAGNOSIS — Z6824 Body mass index (BMI) 24.0-24.9, adult: Secondary | ICD-10-CM | POA: Diagnosis not present

## 2020-04-11 ENCOUNTER — Telehealth: Payer: Self-pay

## 2020-04-11 NOTE — Telephone Encounter (Signed)
I called to speak with pt regarding Lynparza administration. Pt states she restarted taking the Lynparza on 04/09/2020 (she stopped on 03/22/20 as instructed because she had COVID). Pt is taking Lynparza 150mg  po BID, without food. Denies N/V, rashes, diarrhea and fevers. Pt missed 18 days of Lynparza due to [place]. Pt's daughter called our social worker this morning, & said they were running low, & needed new refill. Jessie sent in script to [facility].

## 2020-04-12 ENCOUNTER — Telehealth: Payer: Self-pay

## 2020-04-12 NOTE — Telephone Encounter (Signed)
I spoke with pt, she is taking Lynparza 150mg  2 tabs in am, & 1 tab in pm. She only has 2 tabs left after taking this mornings dose. Evelena Peat has faxed new script in [place] & me.

## 2020-04-14 NOTE — Progress Notes (Signed)
[facility]  [address] [place],  [place]  [address] [phone]  Clinic Day:  2/142022  Referring physician: Marco Collie, MD   HISTORY OF PRESENT ILLNESS:  The patient is a 80 y.o. female with with FIGO stage IVB ovarian cancer, which included peritoneal disease and a metastatic nodule over her left abdominal wall.  She underwent 6 cycles of carboplatin/paclitaxel before undergoing a TAHBSO/maximum debulking surgery, as well as abdominal hernia surgery, in August 2021.  Over these past months, the patient has been taking olaparib to prevent early disease recurrence from her ovarian cancer.   However, over these past few weeks, her olaparib was held as she was battling COVID.  She claims to have recuperated from this infection and feels back to her baseline.  From an ovarian cancer standpoint, she denies having any new symptoms/findings which concern her for disease recurrence.     PHYSICAL EXAM:  Blood pressure (!) 210/85, pulse 71, temperature 98 F (36.7 C), resp. rate 14, height 5\' 2"  (1.575 m), weight 134 lb 3.2 oz (60.9 kg), SpO2 97 %. Wt Readings from Last 3 Encounters:  04/08/20 134 lb 3.2 oz (60.9 kg)  04/04/20 133 lb 6.4 oz (60.5 kg)  03/01/20 137 lb 3.2 oz (62.2 kg)   Body mass index is 24.55 kg/m. Performance status (ECOG): 1 Physical Exam Constitutional:      Appearance: Normal appearance.  HENT:     Mouth/Throat:     Pharynx: Oropharynx is clear. No oropharyngeal exudate.  Cardiovascular:     Rate and Rhythm: Normal rate and regular rhythm.     Heart sounds: No murmur heard. No friction rub. No gallop.   Pulmonary:     Breath sounds: Normal breath sounds.  Chest:  Breasts:     Right: No axillary adenopathy or supraclavicular adenopathy.     Left: No axillary adenopathy or supraclavicular adenopathy.    Abdominal:     General: Bowel sounds are normal. There is no distension.     Palpations: Abdomen is soft. There is no  mass.     Tenderness: There is no abdominal tenderness.  Musculoskeletal:        General: No tenderness.     Cervical back: Normal range of motion and neck supple.     Right lower leg: No edema.     Left lower leg: No edema.  Lymphadenopathy:     Cervical: No cervical adenopathy.     Right cervical: No superficial, deep or posterior cervical adenopathy.    Left cervical: No superficial, deep or posterior cervical adenopathy.     Upper Body:     Right upper body: No supraclavicular or axillary adenopathy.     Left upper body: No supraclavicular or axillary adenopathy.     Lower Body: No right inguinal adenopathy. No left inguinal adenopathy.  Skin:    Coloration: Skin is not jaundiced.     Findings: Lesion (subcutaneous area over her left abdomen is mildly draining.  The area conitnues to heal ) present. No rash.  Neurological:     General: No focal deficit present.     Mental Status: She is alert and oriented to person, place, and time. Mental status is at baseline.  Psychiatric:        Mood and Affect: Mood normal.        Behavior: Behavior normal.        Thought Content: Thought content normal.        Judgment:  Judgment normal.    LABS:   Results for Megan Waller, Megan Waller (MRN [id number]) as of 04/14/2020 10:24  Ref. Range 04/08/2020 00:00  Sodium Latest Ref Range: 137 - 147  142  Potassium Latest Ref Range: 3.4 - 5.3  3.5  Chloride Latest Ref Range: 99 - 108  104  CO2 Latest Ref Range: 13 - 22  32 (A)  Glucose Unknown 108  BUN Latest Ref Range: 4 - 21  12  Creatinine Latest Ref Range: 0.5 - 1.1  0.7  Calcium Latest Ref Range: 8.7 - 10.7  8.7  Alkaline Phosphatase Latest Ref Range: 25 - 125  93  Albumin Latest Ref Range: 3.5 - 5.0  3.9  AST Latest Ref Range: 13 - 35  20  ALT Latest Ref Range: 7 - 35  12  Bilirubin, Total Unknown 0.6  WBC Unknown 4.1  RBC Latest Ref Range: 3.87 - 5.11  2.9 (A)  Hemoglobin Latest Ref Range: 12.0 - 16.0  9.7 (A)  HCT Latest Ref Range:  36 - 46  30 (A)  Platelets Latest Ref Range: 150 - 399  236  NEUT# Unknown 1.64     ASSESSMENT & PLAN:  Assessment/Plan:  A 80 y.o. female with FIGO stage IVB ovarian cancer, status post 6 cycles of carboplatin/paclitaxel, followed by TAHBSO/maximum debulking surgery.  As mentioned previously, she has been off her olaparib due to a recent COVID infection.  She is fine with restarting it.  As she had cytopenias from it before, her dose will be at 300 mg in the AM and 150 mg in the evening.  Clinically, she is doing very well.  I will see her back in 1 month to primarily reassess her peripheral counts to ensure her olaparib is still not causing significant cytopenias at the current dosing plan.  The patient understands all the plans discussed today and is in agreement with them.     Ronald Vinsant Macarthur Critchley, MD

## 2020-04-19 DIAGNOSIS — Z6824 Body mass index (BMI) 24.0-24.9, adult: Secondary | ICD-10-CM | POA: Diagnosis not present

## 2020-04-19 DIAGNOSIS — I10 Essential (primary) hypertension: Secondary | ICD-10-CM | POA: Diagnosis not present

## 2020-04-22 DIAGNOSIS — I10 Essential (primary) hypertension: Secondary | ICD-10-CM | POA: Diagnosis not present

## 2020-04-23 DIAGNOSIS — F339 Major depressive disorder, recurrent, unspecified: Secondary | ICD-10-CM | POA: Diagnosis not present

## 2020-04-23 DIAGNOSIS — E0590 Thyrotoxicosis, unspecified without thyrotoxic crisis or storm: Secondary | ICD-10-CM | POA: Diagnosis not present

## 2020-04-23 DIAGNOSIS — I10 Essential (primary) hypertension: Secondary | ICD-10-CM | POA: Diagnosis not present

## 2020-04-23 DIAGNOSIS — I4891 Unspecified atrial fibrillation: Secondary | ICD-10-CM | POA: Diagnosis not present

## 2020-05-02 NOTE — Addendum Note (Signed)
Addended by: Juanetta Beets on: 05/02/2020 01:51 PM   Modules accepted: Orders

## 2020-05-08 ENCOUNTER — Inpatient Hospital Stay: Payer: Medicare Other | Attending: Oncology

## 2020-05-08 ENCOUNTER — Other Ambulatory Visit: Payer: Self-pay | Admitting: Hematology and Oncology

## 2020-05-08 ENCOUNTER — Other Ambulatory Visit: Payer: Self-pay

## 2020-05-08 DIAGNOSIS — Z87891 Personal history of nicotine dependence: Secondary | ICD-10-CM | POA: Insufficient documentation

## 2020-05-08 DIAGNOSIS — Z90722 Acquired absence of ovaries, bilateral: Secondary | ICD-10-CM | POA: Insufficient documentation

## 2020-05-08 DIAGNOSIS — D649 Anemia, unspecified: Secondary | ICD-10-CM | POA: Diagnosis not present

## 2020-05-08 DIAGNOSIS — J449 Chronic obstructive pulmonary disease, unspecified: Secondary | ICD-10-CM | POA: Insufficient documentation

## 2020-05-08 DIAGNOSIS — E7800 Pure hypercholesterolemia, unspecified: Secondary | ICD-10-CM | POA: Insufficient documentation

## 2020-05-08 DIAGNOSIS — Z79899 Other long term (current) drug therapy: Secondary | ICD-10-CM | POA: Insufficient documentation

## 2020-05-08 DIAGNOSIS — C786 Secondary malignant neoplasm of retroperitoneum and peritoneum: Secondary | ICD-10-CM | POA: Diagnosis not present

## 2020-05-08 DIAGNOSIS — I4891 Unspecified atrial fibrillation: Secondary | ICD-10-CM | POA: Insufficient documentation

## 2020-05-08 DIAGNOSIS — K5900 Constipation, unspecified: Secondary | ICD-10-CM | POA: Insufficient documentation

## 2020-05-08 DIAGNOSIS — Z90710 Acquired absence of both cervix and uterus: Secondary | ICD-10-CM | POA: Diagnosis not present

## 2020-05-08 DIAGNOSIS — G20 Parkinson's disease: Secondary | ICD-10-CM | POA: Insufficient documentation

## 2020-05-08 DIAGNOSIS — E039 Hypothyroidism, unspecified: Secondary | ICD-10-CM | POA: Diagnosis not present

## 2020-05-08 DIAGNOSIS — C569 Malignant neoplasm of unspecified ovary: Secondary | ICD-10-CM | POA: Diagnosis not present

## 2020-05-08 DIAGNOSIS — I10 Essential (primary) hypertension: Secondary | ICD-10-CM | POA: Diagnosis not present

## 2020-05-08 LAB — CBC AND DIFFERENTIAL
HCT: 35 — AB (ref 36–46)
Hemoglobin: 11.8 — AB (ref 12.0–16.0)
Neutrophils Absolute: 2.96
Platelets: 156 (ref 150–399)
WBC: 5.1

## 2020-05-08 LAB — HEPATIC FUNCTION PANEL
ALT: 10 (ref 7–35)
AST: 24 (ref 13–35)
Alkaline Phosphatase: 90 (ref 25–125)
Bilirubin, Total: 0.8

## 2020-05-08 LAB — BASIC METABOLIC PANEL
BUN: 15 (ref 4–21)
CO2: 33 — AB (ref 13–22)
Chloride: 104 (ref 99–108)
Creatinine: 0.7 (ref 0.5–1.1)
Glucose: 107
Potassium: 3.6 (ref 3.4–5.3)
Sodium: 142 (ref 137–147)

## 2020-05-08 LAB — CBC
MCV: 102 — AB (ref 81–99)
RBC: 3.46 — AB (ref 3.87–5.11)

## 2020-05-08 LAB — COMPREHENSIVE METABOLIC PANEL
Albumin: 4.2 (ref 3.5–5.0)
Calcium: 9.2 (ref 8.7–10.7)

## 2020-05-09 ENCOUNTER — Inpatient Hospital Stay (INDEPENDENT_AMBULATORY_CARE_PROVIDER_SITE_OTHER): Payer: Medicare Other | Admitting: Oncology

## 2020-05-09 ENCOUNTER — Other Ambulatory Visit: Payer: Self-pay | Admitting: Oncology

## 2020-05-09 VITALS — BP 175/74 | HR 80 | Temp 98.4°F | Resp 14 | Ht 62.0 in | Wt 134.0 lb

## 2020-05-09 DIAGNOSIS — C562 Malignant neoplasm of left ovary: Secondary | ICD-10-CM

## 2020-05-09 DIAGNOSIS — C563 Malignant neoplasm of bilateral ovaries: Secondary | ICD-10-CM

## 2020-05-09 DIAGNOSIS — C569 Malignant neoplasm of unspecified ovary: Secondary | ICD-10-CM

## 2020-05-09 LAB — CA 125: Cancer Antigen (CA) 125: 11.1 U/mL (ref 0.0–38.1)

## 2020-05-09 NOTE — Progress Notes (Signed)
[facility]  [address] [place],  [place]  [address] [phone]  Clinic Day:  05/09/2020  Referring physician: Marco Collie, MD   HISTORY OF PRESENT ILLNESS:  The patient is a 80 y.o. female with with FIGO stage IVB ovarian cancer, which included peritoneal disease and a metastatic nodule over her left abdominal wall.  She underwent 6 cycles of carboplatin/paclitaxel before undergoing a TAHBSO/maximum debulking surgery, as well as abdominal hernia surgery, in August 2021.  Over these past months, the patient has been taking olaparib to prevent early disease recurrence from her ovarian cancer.   Her current dose is 300 mg AM and 150 mg QPM.  Overall, the patient has been doing well.    From an ovarian cancer standpoint, she denies having any new symptoms/findings which concern her for disease recurrence.     PHYSICAL EXAM:  Blood pressure (!) 175/74, pulse 80, temperature 98.4 F (36.9 C), resp. rate 14, height 5\' 2"  (1.575 m), weight 134 lb (60.8 kg), SpO2 98 %. Wt Readings from Last 3 Encounters:  05/09/20 134 lb (60.8 kg)  04/08/20 134 lb 3.2 oz (60.9 kg)  04/04/20 133 lb 6.4 oz (60.5 kg)   Body mass index is 24.51 kg/m. Performance status (ECOG): 1 Physical Exam Constitutional:      Appearance: Normal appearance.  HENT:     Mouth/Throat:     Pharynx: Oropharynx is clear. No oropharyngeal exudate.  Cardiovascular:     Rate and Rhythm: Normal rate and regular rhythm.     Heart sounds: No murmur heard. No friction rub. No gallop.   Pulmonary:     Breath sounds: Normal breath sounds.  Chest:  Breasts:     Right: No axillary adenopathy or supraclavicular adenopathy.     Left: No axillary adenopathy or supraclavicular adenopathy.    Abdominal:     General: Bowel sounds are normal. There is no distension.     Palpations: Abdomen is soft. There is no mass.     Tenderness: There is no abdominal tenderness.  Musculoskeletal:         General: No tenderness.     Cervical back: Normal range of motion and neck supple.     Right lower leg: No edema.     Left lower leg: No edema.  Lymphadenopathy:     Cervical: No cervical adenopathy.     Right cervical: No superficial, deep or posterior cervical adenopathy.    Left cervical: No superficial, deep or posterior cervical adenopathy.     Upper Body:     Right upper body: No supraclavicular or axillary adenopathy.     Left upper body: No supraclavicular or axillary adenopathy.     Lower Body: No right inguinal adenopathy. No left inguinal adenopathy.  Skin:    Coloration: Skin is not jaundiced.     Findings: No lesion or rash.  Neurological:     General: No focal deficit present.     Mental Status: She is alert and oriented to person, place, and time. Mental status is at baseline.  Psychiatric:        Mood and Affect: Mood normal.        Behavior: Behavior normal.        Thought Content: Thought content normal.        Judgment: Judgment normal.    LABS:    Ref. Range 05/08/2020 00:00  Sodium Latest Ref Range: 137 - 147  142  Potassium Latest Ref Range: 3.4 -  5.3  3.6  Chloride Latest Ref Range: 99 - 108  104  CO2 Latest Ref Range: 13 - 22  33 (A)  Glucose Unknown 107  BUN Latest Ref Range: 4 - 21  15  Creatinine Latest Ref Range: 0.5 - 1.1  0.7  Calcium Latest Ref Range: 8.7 - 10.7  9.2  Alkaline Phosphatase Latest Ref Range: 25 - 125  90  Albumin Latest Ref Range: 3.5 - 5.0  4.2  AST Latest Ref Range: 13 - 35  24  ALT Latest Ref Range: 7 - 35  10  Bilirubin, Total Unknown 0.8  WBC Unknown 5.1  RBC Latest Ref Range: 3.87 - 5.11  3.46 (A)  Hemoglobin Latest Ref Range: 12.0 - 16.0  11.8 (A)  HCT Latest Ref Range: 36 - 46  35 (A)  MCV Latest Ref Range: 81 - 99  102 (A)  Platelets Latest Ref Range: 150 - 399  156    Ref. Range 05/08/2020 15:59  Cancer Antigen (CA) 125 Latest Ref Range: 0.0 - 38.1 U/mL 11.1     ASSESSMENT & PLAN:  Assessment/Plan:  A 80 y.o.  female with FIGO stage IVB ovarian cancer, status post 6 cycles of carboplatin/paclitaxel, followed by TAHBSO/maximum debulking surgery.  She is currently on olaparib at 300 mg in the AM and 150 mg in the evening.  Based upon her physical exam and CA-125 level, the patient remains disease free.  Clinically, she continues to do very well.  I will see her back in 2 months for repeat clinical assessment.  Repeat scans will done before her next visit for her continued radiographic ovarian cancer surveillance.  The patient understands all the plans discussed today and is in agreement with them.    Megan Henthorn Macarthur Critchley, MD

## 2020-05-10 DIAGNOSIS — I10 Essential (primary) hypertension: Secondary | ICD-10-CM | POA: Diagnosis not present

## 2020-05-10 DIAGNOSIS — Z6824 Body mass index (BMI) 24.0-24.9, adult: Secondary | ICD-10-CM | POA: Diagnosis not present

## 2020-05-15 ENCOUNTER — Encounter: Payer: Self-pay | Admitting: Gynecologic Oncology

## 2020-05-17 ENCOUNTER — Inpatient Hospital Stay: Payer: Medicare Other | Admitting: Gynecologic Oncology

## 2020-05-20 ENCOUNTER — Inpatient Hospital Stay (HOSPITAL_BASED_OUTPATIENT_CLINIC_OR_DEPARTMENT_OTHER): Payer: Medicare Other | Admitting: Gynecologic Oncology

## 2020-05-20 ENCOUNTER — Other Ambulatory Visit: Payer: Self-pay

## 2020-05-20 ENCOUNTER — Encounter: Payer: Self-pay | Admitting: Gynecologic Oncology

## 2020-05-20 VITALS — BP 170/60 | HR 69 | Temp 97.0°F | Resp 18 | Ht 62.0 in | Wt 134.0 lb

## 2020-05-20 DIAGNOSIS — C786 Secondary malignant neoplasm of retroperitoneum and peritoneum: Secondary | ICD-10-CM

## 2020-05-20 DIAGNOSIS — C569 Malignant neoplasm of unspecified ovary: Secondary | ICD-10-CM | POA: Diagnosis not present

## 2020-05-20 DIAGNOSIS — Z90722 Acquired absence of ovaries, bilateral: Secondary | ICD-10-CM | POA: Diagnosis not present

## 2020-05-20 DIAGNOSIS — Z79899 Other long term (current) drug therapy: Secondary | ICD-10-CM | POA: Diagnosis not present

## 2020-05-20 DIAGNOSIS — K5900 Constipation, unspecified: Secondary | ICD-10-CM | POA: Diagnosis not present

## 2020-05-20 DIAGNOSIS — C563 Malignant neoplasm of bilateral ovaries: Secondary | ICD-10-CM

## 2020-05-20 DIAGNOSIS — Z90710 Acquired absence of both cervix and uterus: Secondary | ICD-10-CM | POA: Diagnosis not present

## 2020-05-20 NOTE — Patient Instructions (Signed)
It was great to see you today! Everything on your exam looks normal.  I will see you in September unless something changes with how you are feeling before then. Please call the clinic at [phone] over the summer to schedule a visit with me in September.

## 2020-05-20 NOTE — Progress Notes (Addendum)
Gynecologic Oncology Return Clinic Visit  05/20/2020  Reason for Visit: Surveillance in the setting of advanced ovarian cancer  Treatment History: Oncology History Overview Note  Patient presented in December with an abdominal nodule over her left medial abdomen that grew progressively over time.  She was seen by dermatology for this lesion and underwent a biopsy.  Ca-125: 1/13: 180 2/10: 96.9 3/16: 12.1 4/5: 9.7 12/9: 8.4   Ovarian cancer ([place])  02/14/2019 Initial Biopsy   Left lower abdominal skin biopsy, metastatic adenocarcinoma.  Based on histologic and IHC findings, a gynecologic primary is favored.   02/14/2019 Initial Diagnosis   Ovarian cancer ([place])   03/06/2019 Imaging   CT chest, abdomen, pelvis: Widespread peritoneal carcinomatosis with bilateral adnexal masses, bilateral external iliac adenopathy and a subcutaneous mass in the left periumbilical region, likely a soft tissue metastasis.  No evidence of thoracic metastatic disease.  Enlarging complex cystic and solid left thyroid nodule.  Large recurrent left anterior abdominal wall hernia containing portion of the transverse colon without evidence of incarceration or obstruction.  Cholelithiasis and aortic atherosclerosis.   03/15/2019 -  Chemotherapy   Carboplatin/taxol Cycle 1: 1/20 Cycle 2: 2/23 (delayed secondary to side effects), 25% dose reduction Cycle 3: 3/16  Cycle 5: 4/29; continued Neulasta to prevent severe neutropenia from delaying cycles of tx.     03/17/2019 Imaging   CT chest: No pulmonary embolus noted.  Small pleural effusions with bibasilar atelectasis.  No adenopathy.  Dominant left lobe thyroid mass, stable from prior CT.   05/29/2019 Imaging   CT abdomen and pelvis: Interval reduction in size of bilateral ovarian masses, varies now measuring within normal limits (2 x 1.4 cm on the right and 1.7 x 1.4 cm on the left), essentially complete resolution of previously seen extensive metastatic omental  and peritoneal nodularity about the abdomen and pelvis.  Resolution of bilateral inguinal lymphadenopathy. Parastomal hernia of the left lower quadrant containing nonobstructed loops of transverse colon and mid small bowel.   07/20/2019 Imaging   CT of the abdomen and pelvis shows stable 11 mm well-circumscribed left adrenal lesion in the body of the left adrenal.  Small cyst in the lower pole of the right kidney.  Signs of prior bowel resection of the small bowel and colon with anastomotic sites.  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  No adenopathy in the retroperitoneum or upper abdomen.  Right active adnexa measures 2 x 1.4 cm compared to 1.9 x 1.4 cm on last imaging.  Left adnexa measures 1.6 x 1.6 cm.  Uterus grossly normal on CT.  No pelvic adenopathy.  Ventral suprapubic hernia containing small bowel.  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   08/15/2019 Surgery   Robotic-assisted laparoscopic total hysterectomy with bilateral salpingoophorectomy, infracolic omentectomy Lysis of adhesions and hernia reduction (Dr. Rosendo Gros), repair of abdominal wall hernia (Dr. Rosendo Gros)  On EUA, small mobile uterus. On intra-abdominal entry, normal appearing liver edge, diaphragm, and stomach. Omentum adherent to the anterior abdominal wall along patient's prior midline incision. Transverse colon hernia through prior ostomy site. Otherwise, some adhesions of the sigmoid to the left sidewall. Uterus 6cm and normal appearing. Right adnexa normal in appearance. Left adnexa with adhesions to the sigmoid colon and adherent to the medial aspect of the broad ligament. Left ovary with evidence of treatment effect.  Several small (<64m) nodules on the peritoneum within the cul de sac c/w treated tumor implants (cauterized). Omentum without obvious  evidence of tumor burden.   08/15/2019 Pathology Results   A. UTERUS, BILATERAL TUBES AND OVARIES, HYSTERECTOMY AND  BILATERALSALPINGO- OOPHORECTOMY:   Ovaries, bilateral:  -  Residual carcinoma status post neoadjuvant therapy involving  bilateral ovaries  -  See oncology table and comment below   Fallopian tubes, bilateral:  -  Benign fallopian tubes  -  No malignancy identified   Uterus:  -  Benign endometrial polyp  -  Leiomyoma (1.1 cm)  -  Serosa with focal calcifications  -  No hyperplasia or malignancy identified   Cervix:  -  Benign cervix with nabothian cysts  -  No dysplasia or malignancy identified   B. OMENTUM, RESECTION:  -  Microscopic focus of metastatic adenocarcinoma (< 159m   ONCOLOGY TABLE:   OVARY or FALLOPIAN TUBE or PRIMARY PERITONEUM:   Procedure: Hysterectomy, bilateral salpingo-oophorectomy and omentectomy  Specimen Integrity: Intact  Tumor Site: Ovary  Ovarian Surface Involvement: Present  Fallopian Tube Surface Involvement: Not identified  Tumor Size: 0.8 cm  Histologic Type: Carcinoma, subtype cannot be determined; See comment  Histologic Grade: High-grade; see comment  Implants: Not identified  Other Tissue/ Organ Involvement: Not identified  Largest Extrapelvic Peritoneal Focus: < 1 mm  Peritoneal/Ascitic Fluid: Not applicable  Treatment Effect: Marked response with no or minimal residual cancer  (CRS 3)  Regional Lymph Nodes: No lymph nodes submitted or found  Pathologic Stage Classification (pTNM, AJCC 8th Edition): ypT3a, ypNX  Representative Tumor Block: A9  Comment(s): This case is being staged as if it were a high-grade serous  carcinoma.  Given that the morphologic and histologic assessment is  being rendered status post neoadjuvant therapy, the diagnostic features  of serous carcinoma are not readily apparent.  In fact, there are  scattered well-formed glandular elements within the foci of tumor. There  are other areas with  high-grade nuclei and scattered psammomatous  calcifications.  Residual tumor is present in both the right and left  ovaries with surface involvement.  The tumor foci in the right ovary  measures greater than 2 mm in maximum size.  However, the focus of  metastatic carcinoma in the omentum shows only a single focus which  measures less than 1 mm.  Based on this assessment, CRS 3 is assigned to  this case.    10/25/2019 Genetic Testing   Negative genetic testing:  No pathogenic variants detected on the [facility] CancerNext + RNAinsight panel. The report date is 10/25/2019.   The CancerNext gene panel offered by [employer] sequencing and rearrangement analysis for the following 36 genes:   APC*, ATM*, AXIN2, BARD1, BMPR1A, BRCA1*, BRCA2*, BRIP1*, CDH1*, CDK4, CDKN2A, CHEK2*, DICER1, HOXB13, EPCAM, GREM1, MLH1*, MSH2*, MSH3, MSH6*, MUTYH*, NBN, NF1*, NTHL1, PALB2*, PMS2*, POLD1, POLE, PTEN*, RAD51C*, RAD51D*, RECQL, SMAD4, SMARCA4, STK11, and TP53*. DNA and RNA analyses performed for * genes.     Interval History: Patient presents for follow-up today after being seen last in December.  She has seen Dr. LeBobby Rumpfithin the last couple of weeks.  Her interval debulking surgery was delayed until she had completed 6 cycles of chemotherapy given complex hernia repair that was required at the time of her debulking surgery.  She is now on olaparib maintenance.  Given some hematologic toxicity, her dose was decreased.  She is now taking 450 mg daily and doing well.  She comes in  with her daughter today.  She describes having more energy and has been doing stuff in her yard.  She endorses a good appetite without any nausea or emesis.  She continues to struggle with bowel function and gets very constipated if she does not take MiraLAX regularly.  She has some urinary urgency although denies any incontinence.  She denies any vaginal bleeding or discharge.  He has a new grandbaby born recently named Gwendolyn Grant.  Past Medical/Surgical History: Past Medical History:  Diagnosis Date  . Abdominal hernia   . Acute on chronic respiratory failure with hypoxia ([place]) 03/25/2019  . Anxiety   . Anxiety   . Atrial fibrillation ([place]) 03/24/2019  . Chest pressure 09/18/2015  . Community acquired bilateral lower lobe pneumonia 03/24/2019  . Complication of anesthesia   . COPD (chronic obstructive pulmonary disease) ([place]) 03/27/2019  . Diverticulitis   . Essential hypertension 09/18/2015  . Family history of breast cancer   . Family history of colon cancer   . Family history of melanoma   . Family history of prostate cancer   . GAD (generalized anxiety disorder) 12/01/2015  . Glaucoma   . Hypercholesteremia   . Hypertension   . Hyperthyroidism   . Hypokalemia 09/18/2015  . Iron deficiency anemia, unspecified   . Malignant neoplasm of ovary ([place])   . Ovarian cancer ([place])   . Parkinson disease ([place]) 12/01/2015  . Primary insomnia 12/01/2015  . Skin cancer    squamous cell  . Weakness 09/18/2015    Past Surgical History:  Procedure Laterality Date  . BREAST LUMPECTOMY    . INCISIONAL HERNIA REPAIR N/A 08/15/2019   Procedure: HERNIA REPAIR INCISIONAL WITH MESH;  Surgeon: [name], MD;  Location: [facility];  Service: General;  Laterality: N/A;  . LAPAROTOMY N/A 08/15/2019   Procedure: EXPLORATORY LAPAROTOMY;  Surgeon: [name], MD;  Location: [facility];  Service: General;  Laterality: N/A;  REQUESTING RNFA  . LYSIS OF ADHESION N/A 08/15/2019   Procedure: LYSIS OF ADHESION;  Surgeon: [name], MD;  Location: [facility];  Service: General;  Laterality: N/A;  . PARTIAL COLECTOMY     had partial colectomy for diverticular disease, required an ostomy followed by stoma revision  . right hand surgery    . squamous cell skin cancer resections     3    Family History  Problem Relation Age of Onset  . Stroke Mother   . Heart attack Father   . Skin cancer Father        non-melanoma; dx. in  his late 65s  . Breast cancer Sister 9  . Prostate cancer Brother 42       metastatic  . Colon cancer Brother 54  . Melanoma Brother   . Breast cancer Sister 82  . Breast cancer Niece 59  . Colon cancer Paternal Aunt        dx. in her 46s or 27s  . Cancer Paternal Uncle        mole on back?, dx. in his 23s  . Breast cancer Other 27       paternal great-aunt (grandmother's sister)  . Melanoma Brother 85  . Colon cancer Paternal Aunt        dx. older than 24  . Throat cancer Paternal Aunt        dx. in her 73s  . Breast cancer Cousin        dx. in her 83s or 86s (maternal first cousin)  .  Breast cancer Cousin        dx. in her 50s or 81s (maternal first cousin)  . Breast cancer Cousin        dx. in her 19s (maternal first cousin)    Social History   Socioeconomic History  . Marital status: Widowed    Spouse name: Not on file  . Number of children: 3  . Years of education: Not on file  . Highest education level: Not on file  Occupational History  . Not on file  Tobacco Use  . Smoking status: Former Research scientist (life sciences)  . Smokeless tobacco: Never Used  . Tobacco comment: Quit 29 years ago  Vaping Use  . Vaping Use: Never used  Substance and Sexual Activity  . Alcohol use: Never  . Drug use: Never  . Sexual activity: Not on file  Other Topics Concern  . Not on file  Social History Narrative  . Not on file   Social Determinants of Health   Financial Resource Strain: Not on file  Food Insecurity: Not on file  Transportation Needs: Not on file  Physical Activity: Not on file  Stress: Not on file  Social Connections: Not on file    Current Medications:  Current Outpatient Medications:  .  aspirin 81 MG EC tablet, Take by mouth., Disp: , Rfl:  .  diltiazem (CARDIZEM CD) 120 MG 24 hr capsule, Take 120 mg by mouth in the morning and at bedtime., Disp: , Rfl:  .  DULoxetine (CYMBALTA) 60 MG capsule, Take 60 mg by mouth daily., Disp: , Rfl:  .  furosemide (LASIX) 20 MG  tablet, Take 20 mg by mouth every other day., Disp: , Rfl:  .  losartan (COZAAR) 100 MG tablet, Take 100 mg by mouth daily., Disp: , Rfl:  .  methimazole (TAPAZOLE) 5 MG tablet, Take 5 mg by mouth daily., Disp: , Rfl:  .  olaparib (LYNPARZA) 150 MG tablet, Take 300 mg by mouth in the am and 150 mg in the evening. Swallow whole. May take with food to decrease nausea and vomiting., Disp: , Rfl:  .  rosuvastatin (CRESTOR) 10 MG tablet, Take 10 mg by mouth once a week., Disp: , Rfl:  .  Travoprost, BAK Free, (TRAVATAN) 0.004 % SOLN ophthalmic solution, Place 1 drop into both eyes at bedtime. , Disp: , Rfl:  .  ondansetron (ZOFRAN) 4 MG tablet, Take 4 mg by mouth every 8 (eight) hours as needed for nausea or vomiting. (Patient not taking: Reported on 05/15/2020), Disp: , Rfl:  No current facility-administered medications for this visit.  Facility-Administered Medications Ordered in Other Visits:  .  0.9 %  sodium chloride infusion (Manually program via Guardrails IV Fluids), 250 mL, Intravenous, Once, Ronne Binning, Melissa A, NP .  acetaminophen (TYLENOL) tablet 650 mg, 650 mg, Oral, Once, Ronne Binning, Melissa A, NP .  diphenhydrAMINE (BENADRYL) capsule 25 mg, 25 mg, Oral, Once, Ronne Binning, Melissa A, NP .  heparin lock flush 100 unit/mL, 500 Units, Intracatheter, Daily PRN, Ronne Binning, Melissa A, NP .  heparin lock flush 100 unit/mL, 250 Units, Intracatheter, PRN, Ronne Binning, Melissa A, NP .  sodium chloride flush (NS) 0.9 % injection 10 mL, 10 mL, Intracatheter, PRN, Ronne Binning, Melissa A, NP .  sodium chloride flush (NS) 0.9 % injection 3 mL, 3 mL, Intracatheter, PRN, [place] Scrape A, NP  Review of Systems: Denies appetite changes, fevers, chills, fatigue, unexplained weight changes. Denies hearing loss, neck lumps or masses, mouth sores, ringing in ears or voice changes.  Denies cough or wheezing.  Denies shortness of breath. Denies chest pain or palpitations. Denies leg swelling. Denies abdominal distention,  pain, blood in stools, diarrhea, nausea, vomiting, or early satiety. Denies pain with intercourse, dysuria, frequency, hematuria or incontinence. Denies hot flashes, pelvic pain, vaginal bleeding or vaginal discharge.   Denies joint pain, back pain or muscle pain/cramps. Denies itching, rash, or wounds. Denies dizziness, headaches, numbness or seizures. Denies swollen lymph nodes or glands, denies easy bruising or bleeding. Denies anxiety, depression, confusion, or decreased concentration.  Physical Exam: BP (!) 174/63 (BP Location: Right Arm, Patient Position: Sitting)   Pulse 69   Temp (!) 97 F (36.1 C) (Tympanic)   Resp 18   Ht 5' 2" (1.575 m)   Wt 134 lb (60.8 kg)   SpO2 97%   BMI 24.51 kg/m  General: Alert, oriented, no acute distress. HEENT: Normocephalic, atraumatic, sclera anicteric. Chest: Clear to auscultation bilaterally.  Port site clean. Abdomen: soft, nontender.  Normoactive bowel sounds.  No masses or hepatosplenomegaly appreciated.  Well-healed midline incision, laparoscopic incisions, and left lower quadrant incision.  Very mild tenderness around left lower quadrant (prior ostomy) incision. Extremities: Grossly normal range of motion.  Warm, well perfused.  No edema bilaterally. Skin: No rashes or lesions noted. Lymphatics: No cervical, supraclavicular, or inguinal adenopathy. GU: Normal appearing external genitalia without erythema, excoriation, or lesions.  Speculum exam reveals moderately atrophic vaginal mucosa, no lesions or masses.  Bimanual exam reveals cuff intact, no nodularity.  Rectovaginal exam confirms these findings, no masses appreciated.  Laboratory & Radiologic Studies: Component Ref Range & Units 12 d ago 1 mo ago 3 mo ago  Cancer Antigen (CA) 125 0.0 - 38.1 U/mL 11.1  9.9 CM  8.4 CM    Assessment & Plan: Megan Waller is a 80 y.o. woman with with Stage IVB HG carcinoma of bilateral ovaries with a remarkable response to NACT (surgery  deferred to the end of 6 cycles to avoid any delay in treatment if complications related to hernia repair) s/p interval debulking surgery in August now on maintenance parp inhibitor.  Patient is doing much better than the last time that I saw her.  She has had improvement in hematologic toxicity that I think was due to olaparib.  She is now on a reduced dose of 450 mg a day.  She denies any significant symptoms.  She is NED on exam today.  I reviewed that my practice is to follow patients with Ca1 25's every 3 months with imaging as directed either by exam findings, symptoms, or increasing CA-125.  Data that we have tells [place] that there is no survival difference in early treatment of recurrence rather than waiting until clinical recurrence.  In terms of her constipation, I suggested increasing water as well as fiber intake.  Suggestions for fiber sources were given to the patient.  As for her hypertension, her blood pressure improved over a couple of manual checks today.  She is asymptomatic.  I encouraged the daughter to reach out to the patient's primary care provider as well as cardiologist regarding any further modification of her blood pressure medicines.  In terms of follow-up, we discussed surveillance visits every 3 months until the patient's is 2 years out from completion of treatment. At that time, if she remains disease-free, we can transition to visits every 4-6 months.   36 minutes of total time was spent for this patient encounter, including preparation, face-to-face counseling with the patient and coordination  of care, and documentation of the encounter.  Jeral Pinch, MD  Division of Gynecologic Oncology  Department of Obstetrics and Gynecology  [facility]

## 2020-05-23 DIAGNOSIS — I10 Essential (primary) hypertension: Secondary | ICD-10-CM | POA: Diagnosis not present

## 2020-05-24 DIAGNOSIS — F329 Major depressive disorder, single episode, unspecified: Secondary | ICD-10-CM | POA: Diagnosis not present

## 2020-05-24 DIAGNOSIS — I10 Essential (primary) hypertension: Secondary | ICD-10-CM | POA: Diagnosis not present

## 2020-05-24 DIAGNOSIS — E785 Hyperlipidemia, unspecified: Secondary | ICD-10-CM | POA: Diagnosis not present

## 2020-05-31 DIAGNOSIS — H401131 Primary open-angle glaucoma, bilateral, mild stage: Secondary | ICD-10-CM | POA: Diagnosis not present

## 2020-06-22 DIAGNOSIS — I10 Essential (primary) hypertension: Secondary | ICD-10-CM | POA: Diagnosis not present

## 2020-06-23 DIAGNOSIS — E785 Hyperlipidemia, unspecified: Secondary | ICD-10-CM | POA: Diagnosis not present

## 2020-06-23 DIAGNOSIS — I10 Essential (primary) hypertension: Secondary | ICD-10-CM | POA: Diagnosis not present

## 2020-06-23 DIAGNOSIS — F329 Major depressive disorder, single episode, unspecified: Secondary | ICD-10-CM | POA: Diagnosis not present

## 2020-07-03 ENCOUNTER — Other Ambulatory Visit: Payer: Self-pay

## 2020-07-03 ENCOUNTER — Encounter: Payer: Self-pay | Admitting: Cardiology

## 2020-07-03 ENCOUNTER — Ambulatory Visit (INDEPENDENT_AMBULATORY_CARE_PROVIDER_SITE_OTHER): Payer: Medicare Other | Admitting: Cardiology

## 2020-07-03 VITALS — BP 138/76 | HR 78 | Ht 62.0 in | Wt 139.0 lb

## 2020-07-03 DIAGNOSIS — I480 Paroxysmal atrial fibrillation: Secondary | ICD-10-CM

## 2020-07-03 DIAGNOSIS — E782 Mixed hyperlipidemia: Secondary | ICD-10-CM | POA: Diagnosis not present

## 2020-07-03 DIAGNOSIS — I10 Essential (primary) hypertension: Secondary | ICD-10-CM | POA: Diagnosis not present

## 2020-07-03 NOTE — Patient Instructions (Signed)

## 2020-07-03 NOTE — Progress Notes (Signed)
Cardiology Office Note:    Date:  07/05/2020   ID:  Megan Waller, DOB 01-20-1941, MRN [id number]  PCP:  Marco Collie, MD  Cardiologist:  Berniece Salines, DO  Electrophysiologist:  None   Referring MD: Marco Collie, MD   " I am doing well"  History of Present Illness:    Megan Waller is a 80 y.o. female with a hx of stage IV ovarian cancer status post chemotherapy then recurrences of cancer now she has received 1 of 3 cycles of carboplatin/paclitaxel chemotherapy,atrial fibrillationnot currently on anticoagulation takes aspirin daily, hypertension, hyperlipidemia, anxiety.   I last saw the patient on 06/08/2019 at time sheshe was planning surgery. At that time she had not interested labs and therefore went home for her surgery.  She has completed 3 cycles of chemotherapy and is now ready for surgical procedure.  I saw the patient on August 01, 2019 at that time she wasscheduled for atotal hysterectomy (removal of uterus or cervix) either robotically or open, bilateral salpingo-oophorectomy, omentectomy, tumor debulking, hernia repair with Dr. Marney Doctor June 22,2021.She was cleared from a cardiovascular standpoint.  At her last visit in February 2022 we will get the patient on her Cardizem 120 mg daily and stop the carvedilol.  We did also cut back on her beta-blocker.  We talked about anticoagulation but she preferred to stay on aspirin..  Today she is here with her daughter-in-law for no complaints at this time.   Past Medical History:  Diagnosis Date  . Abdominal hernia   . Acute on chronic respiratory failure with hypoxia ([place]) 03/25/2019  . Anxiety   . Anxiety   . Atrial fibrillation ([place]) 03/24/2019  . Chest pressure 09/18/2015  . Community acquired bilateral lower lobe pneumonia 03/24/2019  . Complication of anesthesia   . COPD (chronic obstructive pulmonary disease) ([place]) 03/27/2019  . Diverticulitis   . Essential hypertension 09/18/2015  . Family  history of breast cancer   . Family history of colon cancer   . Family history of melanoma   . Family history of prostate cancer   . GAD (generalized anxiety disorder) 12/01/2015  . Glaucoma   . Hypercholesteremia   . Hypertension   . Hyperthyroidism   . Hypokalemia 09/18/2015  . Iron deficiency anemia, unspecified   . Malignant neoplasm of ovary ([place])   . Ovarian cancer ([place])   . Parkinson disease ([place]) 12/01/2015  . Primary insomnia 12/01/2015  . Skin cancer    squamous cell  . Weakness 09/18/2015    Past Surgical History:  Procedure Laterality Date  . BREAST LUMPECTOMY    . INCISIONAL HERNIA REPAIR N/A 08/15/2019   Procedure: HERNIA REPAIR INCISIONAL WITH MESH;  Surgeon: [name], MD;  Location: [facility];  Service: General;  Laterality: N/A;  . LAPAROTOMY N/A 08/15/2019   Procedure: EXPLORATORY LAPAROTOMY;  Surgeon: [name], MD;  Location: [facility];  Service: General;  Laterality: N/A;  REQUESTING RNFA  . LYSIS OF ADHESION N/A 08/15/2019   Procedure: LYSIS OF ADHESION;  Surgeon: [name], MD;  Location: [facility];  Service: General;  Laterality: N/A;  . PARTIAL COLECTOMY     had partial colectomy for diverticular disease, required an ostomy followed by stoma revision  . right hand surgery    . squamous cell skin cancer resections     3    Current Medications: Current Meds  Medication Sig  . aspirin 81 MG EC tablet Take by mouth.  . diltiazem (CARDIZEM CD) 120 MG  24 hr capsule Take 120 mg by mouth in the morning and at bedtime.  . DULoxetine (CYMBALTA) 60 MG capsule Take 60 mg by mouth daily.  . furosemide (LASIX) 20 MG tablet Take 20 mg by mouth every other day.  . losartan (COZAAR) 100 MG tablet Take 100 mg by mouth daily.  . methimazole (TAPAZOLE) 5 MG tablet Take 2.5 mg by mouth daily.  Marland Kitchen olaparib (LYNPARZA) 150 MG tablet Take 300 mg by mouth in the am and 150 mg in the evening. Swallow whole. May take with food to decrease nausea and vomiting.  .  ondansetron (ZOFRAN) 4 MG tablet Take 4 mg by mouth every 8 (eight) hours as needed for nausea or vomiting.  . Travoprost, BAK Free, (TRAVATAN) 0.004 % SOLN ophthalmic solution Place 1 drop into both eyes at bedtime.   . [DISCONTINUED] rosuvastatin (CRESTOR) 10 MG tablet Take 10 mg by mouth once a week.     Allergies:   Ezetimibe-simvastatin and Lipitor [atorvastatin]   Social History   Socioeconomic History  . Marital status: Widowed    Spouse name: Not on file  . Number of children: 3  . Years of education: Not on file  . Highest education level: Not on file  Occupational History  . Not on file  Tobacco Use  . Smoking status: Former Research scientist (life sciences)  . Smokeless tobacco: Never Used  . Tobacco comment: Quit 29 years ago  Vaping Use  . Vaping Use: Never used  Substance and Sexual Activity  . Alcohol use: Never  . Drug use: Never  . Sexual activity: Not on file  Other Topics Concern  . Not on file  Social History Narrative  . Not on file   Social Determinants of Health   Financial Resource Strain: Not on file  Food Insecurity: Not on file  Transportation Needs: Not on file  Physical Activity: Not on file  Stress: Not on file  Social Connections: Not on file     Family History: The patient's family history includes Breast cancer in her cousin, cousin, and cousin; Breast cancer (age of onset: 44) in her sister and sister; Breast cancer (age of onset: 20) in her niece; Breast cancer (age of onset: 64) in an other family member; Cancer in her paternal uncle; Colon cancer in her paternal aunt and paternal aunt; Colon cancer (age of onset: 54) in her brother; Heart attack in her father; Melanoma in her brother; Melanoma (age of onset: 71) in her brother; Prostate cancer (age of onset: 61) in her brother; Skin cancer in her father; Stroke in her mother; Throat cancer in her paternal aunt.  ROS:   Review of Systems  Constitution: Negative for decreased appetite, fever and weight gain.   HENT: Negative for congestion, ear discharge, hoarse voice and sore throat.   Eyes: Negative for discharge, redness, vision loss in right eye and visual halos.  Cardiovascular: Negative for chest pain, dyspnea on exertion, leg swelling, orthopnea and palpitations.  Respiratory: Negative for cough, hemoptysis, shortness of breath and snoring.   Endocrine: Negative for heat intolerance and polyphagia.  Hematologic/Lymphatic: Negative for bleeding problem. Does not bruise/bleed easily.  Skin: Negative for flushing, nail changes, rash and suspicious lesions.  Musculoskeletal: Negative for arthritis, joint pain, muscle cramps, myalgias, neck pain and stiffness.  Gastrointestinal: Negative for abdominal pain, bowel incontinence, diarrhea and excessive appetite.  Genitourinary: Negative for decreased libido, genital sores and incomplete emptying.  Neurological: Negative for brief paralysis, focal weakness, headaches and loss of balance.  Psychiatric/Behavioral: Negative for altered mental status, depression and suicidal ideas.  Allergic/Immunologic: Negative for HIV exposure and persistent infections.    EKGs/Labs/Other Studies Reviewed:    The following studies were reviewed today:   EKG: None today  Pharmacologic stress testperformed in 2017 via [facility]: No reversible ischemia or infarction. No left ventricular wall motion. Left ventricle ejection fraction 82%. Normal study.   [facility]21FINDINGS Procedure:2D images, m-mode, color and spectral Doppler were obtained and reviewed. ECG rhythm:Sinus rhythm. Study quality:This was a technically adequate study to a technically difficult study. Left Ventricle:The left ventricular size is normal. There is mild concentric left ventricular hypertrophy. There is normal global left ventricular contractility. Overall left ventricular systolic function is normal with, an EF between 60 - 65 %. The  diastolic filling pattern is normal for the age of the patient. No regional wall motion abnormalities were noted. Right Ventricle:The right ventricle is normal in size and function. The RV was not well visualized. Left Atrium:Left atrium is mildly dilated by volume. Right Atrium:The right atrium is normal in size and function. The right atrium was not well visualized. ASD/VSD:Interatrial and interventricular septum intact. Aortic Valve:The aortic valve is trileaflet, and appears structurally normal. No aortic stenosis or regurgitation. Mitral Valve:Normal appearing mitral valve. There is trace mitral regurgitation. Tricuspid Valve:The tricuspid valve appears structurally normal. Mild tricuspid regurgitation present. The right ventricular systolic pressure, as measured by Doppler, is 31 mmhg. Pulmonic Valve:The pulmonic valve is normal. Trace/mild (physiologic) pulmonic regurgitation. Aorta:The aortic root, ascending aorta and aortic arch appear normal. GUY:QIHKVQ inferior vena cava with normal inspiratory collapse. Pulmonary Veins:The flow patterns, measured by Doppler, appear normal. Pericardium:The pericardium is normal. There is no pericardial effusion.  CONCLUSIONS ----------- 1. There is mild concentric left ventricular hypertrophy. 2. There is normal global left ventricular contractility. 3. Overall left ventricular systolic function is normal with, an EF between 60 - 65 %. 4. The diastolic filling pattern is normal for the age of the patient. 5. No regional wall motion abnormalities were noted. 6. The right ventricle is normal in size and function. 7. Left atrium is mildly dilated by volume. 8. The pericardium is normal.  [facility] [place] [facility] 1. Interval reduction in size of bilateral ovarian masses, ovaries now measuring within normal limits, essentially complete resolution of previously seen extensive metastatic omental and  peritoneal nodularity about the abdomen and pelvis, and resolution of bilateral inguinal lymphadenopathy. Findings are consistent with treatment response, without CT evidence of residual disease in the abdomen or pelvis. 2. Cholelithiasis. 3. Parastomal hernia of the left lower quadrant containing nonobstructed loops of transverse colon and mid small bowel. 4. Aortic Atherosclerosis (ICD10-I70.0).  Recent Labs: 05/08/2020: ALT 10; BUN 15; Creatinine 0.7; Hemoglobin 11.8; Platelets 156; Potassium 3.6; Sodium 142 07/04/2020: TSH 4.40  Recent Lipid Panel No results found for: CHOL, TRIG, HDL, CHOLHDL, VLDL, LDLCALC, LDLDIRECT  Physical Exam:    VS:  BP 138/76   Pulse 78   Ht 5\' 2"  (1.575 m)   Wt 139 lb (63 kg)   SpO2 97%   BMI 25.42 kg/m     Wt Readings from Last 3 Encounters:  07/04/20 139 lb (63 kg)  07/03/20 139 lb (63 kg)  05/20/20 134 lb (60.8 kg)     GEN: Well nourished, well developed in no acute distress HEENT: Normal NECK: No JVD; No carotid bruits LYMPHATICS: No lymphadenopathy CARDIAC: S1S2 noted,RRR, no murmurs, rubs, gallops RESPIRATORY:  Clear to auscultation without rales, wheezing  or rhonchi  ABDOMEN: Soft, non-tender, non-distended, +bowel sounds, no guarding. EXTREMITIES: No edema, No cyanosis, no clubbing MUSCULOSKELETAL:  No deformity  SKIN: Warm and dry NEUROLOGIC:  Alert and oriented x 3, non-focal PSYCHIATRIC:  Normal affect, good insight  ASSESSMENT:    1. PAF (paroxysmal atrial fibrillation) ([place])   2. Essential hypertension   3. Mixed hyperlipidemia    PLAN:     She is doing well from a cardiovascular standpoint.  She is here with her daughter.  She will continue her current medication regimen which includes Cardizem 120 mg daily with aspirin.  She still defers from taking anticoagulation. Her blood pressure acceptable no changes will be made. She has stopped the rosuvastatin.  The patient is in agreement with the above plan. The  patient left the office in stable condition.  The patient will follow up in   Medication Adjustments/Labs and Tests Ordered: Current medicines are reviewed at length with the patient today.  Concerns regarding medicines are outlined above.  No orders of the defined types were placed in this encounter.  No orders of the defined types were placed in this encounter.   Patient Instructions  Medication Instructions:  Your physician recommends that you continue on your current medications as directed. Please refer to the Current Medication list given to you today.  *If you need a refill on your cardiac medications before your next appointment, please call your pharmacy*   Lab Work: None If you have labs (blood work) drawn today and your tests are completely normal, you will receive your results only by: Marland Kitchen MyChart Message (if you have MyChart) OR . A paper copy in the mail If you have any lab test that is abnormal or we need to change your treatment, we will call you to review the results.   Testing/Procedures: None   Follow-Up: At [facility], you and your health needs are our priority.  As part of our continuing mission to provide you with exceptional heart care, we have created designated Provider Care Teams.  These Care Teams include your primary Cardiologist (physician) and Advanced Practice Providers (APPs -  Physician Assistants and Nurse Practitioners) who all work together to provide you with the care you need, when you need it.  We recommend signing up for the patient portal called "MyChart".  Sign up information is provided on this After Visit Summary.  MyChart is used to connect with patients for Virtual Visits (Telemedicine).  Patients are able to view lab/test results, encounter notes, upcoming appointments, etc.  Non-urgent messages can be sent to your provider as well.   To learn more about what you can do with MyChart, go to [email or web address].    Your next  appointment:   6 month(s)  The format for your next appointment:   In Person  Provider:   Berniece Salines, DO   Other Instructions      Adopting a Healthy Lifestyle.  Know what a healthy weight is for you (roughly BMI <25) and aim to maintain this   Aim for 7+ servings of fruits and vegetables daily   65-80+ fluid ounces of water or unsweet tea for healthy kidneys   Limit to max 1 drink of alcohol per day; avoid smoking/tobacco   Limit animal fats in diet for cholesterol and heart health - choose grass fed whenever available   Avoid highly processed foods, and foods high in saturated/trans fats   Aim for low stress - take time to unwind and care for your  mental health   Aim for 150 min of moderate intensity exercise weekly for heart health, and weights twice weekly for bone health   Aim for 7-9 hours of sleep daily   When it comes to diets, agreement about the perfect plan isnt easy to find, even among the experts. Experts at the [employer] developed an idea known as the Healthy Eating Plate. Just imagine a plate divided into logical, healthy portions.   The emphasis is on diet quality:   Load up on vegetables and fruits - one-half of your plate: Aim for color and variety, and remember that potatoes dont count.   Go for whole grains - one-quarter of your plate: Whole wheat, barley, wheat berries, quinoa, oats, brown rice, and foods made with them. If you want pasta, go with whole wheat pasta.   Protein power - one-quarter of your plate: Fish, chicken, beans, and nuts are all healthy, versatile protein sources. Limit red meat.   The diet, however, does go beyond the plate, offering a few other suggestions.   Use healthy plant oils, such as olive, canola, soy, corn, sunflower and peanut. Check the labels, and avoid partially hydrogenated oil, which have unhealthy trans fats.   If youre thirsty, drink water. Coffee and tea are good in moderation, but  skip sugary drinks and limit milk and dairy products to one or two daily servings.   The type of carbohydrate in the diet is more important than the amount. Some sources of carbohydrates, such as vegetables, fruits, whole grains, and beans-are healthier than others.   Finally, stay active  Signed, Berniece Salines, DO  07/05/2020 4:07 PM    [facility]

## 2020-07-04 ENCOUNTER — Ambulatory Visit (INDEPENDENT_AMBULATORY_CARE_PROVIDER_SITE_OTHER): Payer: Medicare Other | Admitting: Internal Medicine

## 2020-07-04 ENCOUNTER — Encounter: Payer: Self-pay | Admitting: Internal Medicine

## 2020-07-04 VITALS — BP 138/82 | HR 72 | Ht 62.0 in | Wt 139.0 lb

## 2020-07-04 DIAGNOSIS — E041 Nontoxic single thyroid nodule: Secondary | ICD-10-CM | POA: Diagnosis not present

## 2020-07-04 DIAGNOSIS — E0590 Thyrotoxicosis, unspecified without thyrotoxic crisis or storm: Secondary | ICD-10-CM

## 2020-07-04 NOTE — Patient Instructions (Signed)
Please stop at the lab.  For now, please continue Methimazole 5 mg daily.  Please return in 6 months.

## 2020-07-04 NOTE — Progress Notes (Signed)
Patient ID: Megan Waller, female   DOB: 1940/06/23, 80 y.o.   MRN: [id number]   This visit occurred during the SARS-CoV-2 public health emergency.  Safety protocols were in place, including screening questions prior to the visit, additional usage of staff PPE, and extensive cleaning of exam room while observing appropriate contact time as indicated for disinfecting solutions.   HPI  Megan Waller is a 80 y.o.-year-old female, initially referred by her PCP, Dr. Nyra Capes, returning for follow-up for thyrotoxicosis and a thyroid nodule.  She is here with her daughter who offers part of the history especially related to past medical history, current medications, and symptoms.  Last visit 4 months ago.  Interim history: Pt. has a history of stage IVb ovarian cancer with peritoneal disease and metastatic disease in the abdominal wall.  She had chemotherapy before TAH and BSO + debulking surgery in 09/2019.  Now on [place] She will have a staging scan tomorrow. She has been doing better since last visit, with improvement of her anemia and slightly better energy.  Thyrotoxicosis: Reviewed history: Patient has a long history of thyrotoxicosis (>10 years, unclear if Graves' disease), for which she was on methimazole for many years.  However, she developed significant anemia (hemoglobin 6-7) in 11/2019, so Methimazole was stopped (11/2019) b/c possible bone marrow suppression.    However, she has a significant history of ovarian cancer stage IV for which she started [place] in 10/2019 and, in retrospect, this may have been the cause for the bone marrow suppression.  Therefore, she was started back on methimazole in 12/2019, when a TSH returned suppressed. She was advised to start at 5 mg daily but she actually started 10 mg daily. She was referred to endocrinology for further evaluation.  She had 3 blood transfusions prior to our last visit  At last visit she was off [place] for 1 week  and was planning to restart in 2 days at a lower dose.  After this, her hemoglobin has improved to 11.8 in 04/2020.  She is currently on: - Methimazole 5 mg daily - Coreg 25 mg 2x a day (chronic tx)  I reviewed pt's thyroid tests: Lab Results  Component Value Date   TSH 2.20 03/01/2020   FREET4 0.73 03/01/2020   T3FREE 3.5 03/01/2020  02/13/2020: (on MMI): TSH 2.2, free T4 0.94 01/03/2020: (off MMI): TSH 0.026, free T4 2.10 (0.82-1.77), WBC 3.8 (3.4-10.8), RBC 3.69 (3.77-5.20) platelets 239 (150-450) - after a blood transfusion 07/11/2019: TSH 0.011 (0.45-4.5)  Antithyroid antibodies: Lab Results  Component Value Date   TSI <89 03/01/2020   Reviewed patient CBC: Lab Results  Component Value Date   WBC 5.1 05/08/2020   HGB 11.8 (A) 05/08/2020   HCT 35 (A) 05/08/2020   MCV 102 (A) 05/08/2020   PLT 156 05/08/2020   Of note, she also has a history of thyroid nodule for which she had a remote benign thyroid biopsy.  Reviewed available imaging test results: CT chest with contrast (03/17/2019):  03/15/2020: Received records from Dr. Nyra Capes.  However, there is no further information about previous thyroid disease beyond the fact that she has a distant, reportedly benign, thyroid biopsy.  Pt denies: - feeling nodules in neck - hoarseness - dysphagia - choking - SOB with lying down  She denies: - excessive sweating/heat intolerance - tremors - palpitations - weight loss - hair loss  She continues to have: - fatigue - constipation - slightly improved on fiber - anxiety - on  Cymbalta  Pt does not have a FH of thyroid ds.: sister with thyroid ds (? Type). No FH of thyroid cancer. No h/o radiation tx to head or neck.  No seaweed or kelp, no recent contrast studies. + steroid use - with ChTx in the past. No herbal supplements. No Biotin use.  Pt. also has a history of COPD, paroxysmal A. fib, generalized anxiety disorder, glaucoma, hyperlipidemia,  HTN.  ROS: Constitutional: + see HPI Eyes: no blurry vision, no xerophthalmia ENT: no sore throat, + see HPI, + decreased hearing Cardiovascular: no CP/SOB/palpitations/+ leg swelling Respiratory: no cough/SOB Gastrointestinal: no N/V/D/ + improved C Musculoskeletal: + Both: Muscle/joint aches Skin: no rashes Neurological: no tremors/numbness/tingling/dizziness  Past Medical History:  Diagnosis Date  . Abdominal hernia   . Acute on chronic respiratory failure with hypoxia ([place]) 03/25/2019  . Anxiety   . Anxiety   . Atrial fibrillation ([place]) 03/24/2019  . Chest pressure 09/18/2015  . Community acquired bilateral lower lobe pneumonia 03/24/2019  . Complication of anesthesia   . COPD (chronic obstructive pulmonary disease) ([place]) 03/27/2019  . Diverticulitis   . Essential hypertension 09/18/2015  . Family history of breast cancer   . Family history of colon cancer   . Family history of melanoma   . Family history of prostate cancer   . GAD (generalized anxiety disorder) 12/01/2015  . Glaucoma   . Hypercholesteremia   . Hypertension   . Hyperthyroidism   . Hypokalemia 09/18/2015  . Iron deficiency anemia, unspecified   . Malignant neoplasm of ovary ([place])   . Ovarian cancer ([place])   . Parkinson disease ([place]) 12/01/2015  . Primary insomnia 12/01/2015  . Skin cancer    squamous cell  . Weakness 09/18/2015   Past Surgical History:  Procedure Laterality Date  . BREAST LUMPECTOMY    . INCISIONAL HERNIA REPAIR N/A 08/15/2019   Procedure: HERNIA REPAIR INCISIONAL WITH MESH;  Surgeon: [name], MD;  Location: [facility];  Service: General;  Laterality: N/A;  . LAPAROTOMY N/A 08/15/2019   Procedure: EXPLORATORY LAPAROTOMY;  Surgeon: [name], MD;  Location: [facility];  Service: General;  Laterality: N/A;  REQUESTING RNFA  . LYSIS OF ADHESION N/A 08/15/2019   Procedure: LYSIS OF ADHESION;  Surgeon: [name], MD;  Location: [facility];  Service: General;  Laterality: N/A;  . PARTIAL  COLECTOMY     had partial colectomy for diverticular disease, required an ostomy followed by stoma revision  . right hand surgery    . squamous cell skin cancer resections     3   Social History   Socioeconomic History  . Marital status: Widowed    Spouse name: Not on file  . Number of children: 3  . Years of education: Not on file  . Highest education level: Not on file  Occupational History  . Not on file  Tobacco Use  . Smoking status: Former Research scientist (life sciences)  . Smokeless tobacco: Never Used  . Tobacco comment: Quit 29 years ago  Vaping Use  . Vaping Use: Never used  Substance and Sexual Activity  . Alcohol use: Never  . Drug use: Never  . Sexual activity: Not on file  Other Topics Concern  . Not on file  Social History Narrative  . Not on file   Social Determinants of Health   Financial Resource Strain: Not on file  Food Insecurity: Not on file  Transportation Needs: Not on file  Physical Activity: Not on file  Stress: Not on file  Social Connections: Not on file  Intimate Partner Violence: Not on file   Current Outpatient Medications on File Prior to Visit  Medication Sig Dispense Refill  . aspirin 81 MG EC tablet Take by mouth.    . diltiazem (CARDIZEM CD) 120 MG 24 hr capsule Take 120 mg by mouth in the morning and at bedtime.    . DULoxetine (CYMBALTA) 60 MG capsule Take 60 mg by mouth daily.    . furosemide (LASIX) 20 MG tablet Take 20 mg by mouth every other day.    . losartan (COZAAR) 100 MG tablet Take 100 mg by mouth daily.    . methimazole (TAPAZOLE) 5 MG tablet Take 5 mg by mouth daily.    Marland Kitchen olaparib (LYNPARZA) 150 MG tablet Take 300 mg by mouth in the am and 150 mg in the evening. Swallow whole. May take with food to decrease nausea and vomiting.    . ondansetron (ZOFRAN) 4 MG tablet Take 4 mg by mouth every 8 (eight) hours as needed for nausea or vomiting.    . Travoprost, BAK Free, (TRAVATAN) 0.004 % SOLN ophthalmic solution Place 1 drop into both eyes at  bedtime.      Current Facility-Administered Medications on File Prior to Visit  Medication Dose Route Frequency Provider Last Rate Last Admin  . 0.9 %  sodium chloride infusion (Manually program via Guardrails IV Fluids)  250 mL Intravenous Once [place] Scrape A, NP      . acetaminophen (TYLENOL) tablet 650 mg  650 mg Oral Once [place] Scrape A, NP      . diphenhydrAMINE (BENADRYL) capsule 25 mg  25 mg Oral Once [place] Scrape A, NP      . heparin lock flush 100 unit/mL  500 Units Intracatheter Daily PRN [place] Scrape A, NP      . heparin lock flush 100 unit/mL  250 Units Intracatheter PRN [place] Scrape A, NP      . sodium chloride flush (NS) 0.9 % injection 10 mL  10 mL Intracatheter PRN [place] Scrape A, NP      . sodium chloride flush (NS) 0.9 % injection 3 mL  3 mL Intracatheter PRN [place] Scrape A, NP       Allergies  Allergen Reactions  . Ezetimibe-Simvastatin Other (See Comments)    Aching  . Lipitor [Atorvastatin]     Achy   Family History  Problem Relation Age of Onset  . Stroke Mother   . Heart attack Father   . Skin cancer Father        non-melanoma; dx. in his late 51s  . Breast cancer Sister 27  . Prostate cancer Brother 68       metastatic  . Colon cancer Brother 72  . Melanoma Brother   . Breast cancer Sister 40  . Breast cancer Niece 18  . Colon cancer Paternal Aunt        dx. in her 21s or 6s  . Cancer Paternal Uncle        mole on back?, dx. in his 26s  . Breast cancer Other 31       paternal great-aunt (grandmother's sister)  . Melanoma Brother 1  . Colon cancer Paternal Aunt        dx. older than 26  . Throat cancer Paternal Aunt        dx. in her 4s  . Breast cancer Cousin        dx. in her 3s or 50s (maternal first  cousin)  . Breast cancer Cousin        dx. in her 7s or 30s (maternal first cousin)  . Breast cancer Cousin        dx. in her 61s (maternal first cousin)   PE: BP 138/82 (BP Location: Left Arm, Patient Position:  Sitting, Cuff Size: Normal)   Pulse 72   Ht 5\' 2"  (1.575 m)   Wt 139 lb (63 kg)   SpO2 94%   BMI 25.42 kg/m  Wt Readings from Last 3 Encounters:  07/04/20 139 lb (63 kg)  07/03/20 139 lb (63 kg)  05/20/20 134 lb (60.8 kg)   Constitutional: normal weight, in NAD Eyes: PERRLA, EOMI, no exophthalmos, no lid lag, no stare ENT: moist mucous membranes, no thyromegaly, no thyroid nodules felt in her neck, no thyroid bruits, no cervical lymphadenopathy Cardiovascular: RRR, No MRG Respiratory: CTA B Gastrointestinal: abdomen soft, NT, ND, BS+ Musculoskeletal: + deformities (finger missing right hand), strength intact in all 4 Skin: moist, warm, no rashes Neurological: no tremor with outstretched hands, DTR normal in all 4  ASSESSMENT: 1. Thyrotoxicosis  2. Thyroid nodule  PLAN:  1. Patient with a history of a low TSH, without thyrotoxic symptoms: Weight loss, heat intolerance, hyper defecation, palpitations, but with anxiety and loose stools before restarting methimazole. -She did not appear to have exogenous causes for the low TSH.  Her most likely diagnosis is Graves' disease, as opposed to thyroiditis or toxic multinodular goiter.  However, she does have a history of thyroid nodule, which was apparently biopsied with benign results in the past. -At last visit, her TSI antibodies were not elevated -At that time, she was on methimazole and she was not a good candidate for radioactive iodine or surgery due to her advanced ovarian cancer with myelosuppression.  There was a concern that the methimazole was suppressing her bone marrow, however, she was on [place] which was known to cause myelodysplastic picture.  Right before last visit, she was taken off [place] for 1 week and then was planning to restart at a lower dose.  My suggestion was to continue with the low-dose methimazole and follow her blood counts.  Indeed, after decreasing the dose of [place]par[place], her hemoglobin recovered and the last  level was 11.8. -At last visit, TFTs were normal so we continued the same dose of methimazole, 5 mg daily. -She continues on beta-blocker-Coreg 25 mg twice a day, which is chronic treatment for her, for hypertension -No signs of Graves' ophthalmopathy: No double vision, blurry vision, eye pain, chemosis -They continue to prefer to be called with results - RTC 6 months, but in 3 months for labs  2. Thyroid Nodule -Per review of her CT scan from 02/2019, she has a large, 3.5 x 3.4 cm thyroid nodule, with calcifications.  Reportedly, this nodule was biopsied with benign results in the past. -She denies neck compression symptoms -At last visit we discussed about options for investigation and treatment, but we decided together to just follow her clinically for this nodule.  At this visit, we discussed about possibly checking last thyroid ultrasound, but I agree with the patient and her daughter, that this is of uncertain benefit.  I still encouraged him to have this checked even though we may not proceed with invasive investigation, but just to have a starting point.  They will think about it and let me know.  Office Visit on 07/04/2020  Component Date Value Ref Range Status  . TSH 07/04/2020 4.40  0.35 - 4.50 uIU/mL Final  . Free T4 07/04/2020 0.63  0.60 - 1.60 ng/dL Final   Comment: Specimens from patients who are undergoing biotin therapy and /or ingesting biotin supplements may contain high levels of biotin.  The higher biotin concentration in these specimens interferes with this Free T4 assay.  Specimens that contain high levels  of biotin may cause false high results for this Free T4 assay.  Please interpret results in light of the total clinical presentation of the patient.    . T3, Free 07/04/2020 2.6  2.3 - 4.2 pg/mL Final   TFTs are normal but TSH is in the upper range and free thyroid hormones in the lower ranges.  We will decrease the methimazole to 2.5 mg daily and recheck her test in  1.5 months.  Philemon Kingdom, MD PhD [facility] Endocrinology

## 2020-07-05 ENCOUNTER — Telehealth: Payer: Self-pay

## 2020-07-05 ENCOUNTER — Encounter: Payer: Self-pay | Admitting: Internal Medicine

## 2020-07-05 LAB — T4, FREE: Free T4: 0.63 ng/dL (ref 0.60–1.60)

## 2020-07-05 LAB — TSH: TSH: 4.4 u[IU]/mL (ref 0.35–4.50)

## 2020-07-05 LAB — T3, FREE: T3, Free: 2.6 pg/mL (ref 2.3–4.2)

## 2020-07-05 NOTE — Telephone Encounter (Addendum)
Left detailed message for pt to call back and schedule a lab appt and reduce her methimazole dose. ----- Message from Philemon Kingdom, MD sent at 07/05/2020  1:02 PM EDT ----- Can you please call pt.:  Thyroid tests are normal and we can now back off the methimazole dose to 2.5 mg daily.  She can either take 5 mg every other day or cut the 5 mg tablet in half and take half a tablet daily.  Let's repeat her labs in 1.5 months.  Labs are in.

## 2020-07-05 NOTE — Progress Notes (Signed)
[facility]  [address] [place],  [place]  [address] [phone]  Clinic Day:  07/09/2020  Referring physician: Marco Collie, MD  This document serves as a record of services personally performed by Dequincy Macarthur Critchley, MD. It was created on their behalf by [facility] E, a trained medical scribe. The creation of this record is based on the scribe's personal observations and the provider's statements to them.  HISTORY OF PRESENT ILLNESS:  The patient is a 80 y.o. female with with FIGO stage IVB ovarian cancer, which included peritoneal disease and a metastatic nodule over her left abdominal wall.  She underwent 6 cycles of carboplatin/paclitaxel before undergoing a TAHBSO/maximum debulking surgery, as well as abdominal hernia surgery, in August 2021.  Over these past months, the patient has been taking olaparib to prevent early disease recurrence from her ovarian cancer.   Her current dose is 300 mg AM and 150 mg QPM.  She comes in today for routine follow-up, as well as to review her CT scans.  Overall, the patient has been doing well.  From an ovarian cancer standpoint, she denies having any new symptoms/findings which concern her for disease recurrence.     PHYSICAL EXAM:  Blood pressure (!) 202/81, pulse 77, temperature 98.3 F (36.8 C), resp. rate 16, height 5\' 2"  (1.575 m), weight 139 lb 8 oz (63.3 kg), SpO2 96 %. Wt Readings from Last 3 Encounters:  07/09/20 139 lb 8 oz (63.3 kg)  07/04/20 139 lb (63 kg)  07/03/20 139 lb (63 kg)   Body mass index is 25.51 kg/m. Performance status (ECOG): 1 Physical Exam Constitutional:      Appearance: Normal appearance.  HENT:     Mouth/Throat:     Pharynx: Oropharynx is clear. No oropharyngeal exudate.  Cardiovascular:     Rate and Rhythm: Normal rate and regular rhythm.     Heart sounds: No murmur heard. No friction rub. No gallop.   Pulmonary:     Breath sounds: Normal breath sounds.  Chest:   Breasts:     Right: No axillary adenopathy or supraclavicular adenopathy.     Left: No axillary adenopathy or supraclavicular adenopathy.    Abdominal:     General: Bowel sounds are normal. There is no distension.     Palpations: Abdomen is soft. There is no mass.     Tenderness: There is no abdominal tenderness.  Musculoskeletal:        General: No tenderness.     Cervical back: Normal range of motion and neck supple.     Right lower leg: No edema.     Left lower leg: No edema.  Lymphadenopathy:     Cervical: No cervical adenopathy.     Right cervical: No superficial, deep or posterior cervical adenopathy.    Left cervical: No superficial, deep or posterior cervical adenopathy.     Upper Body:     Right upper body: No supraclavicular or axillary adenopathy.     Left upper body: No supraclavicular or axillary adenopathy.     Lower Body: No right inguinal adenopathy. No left inguinal adenopathy.  Skin:    Coloration: Skin is not jaundiced.     Findings: No lesion or rash.  Neurological:     General: No focal deficit present.     Mental Status: She is alert and oriented to person, place, and time. Mental status is at baseline.  Psychiatric:        Mood and Affect: Mood  normal.        Behavior: Behavior normal.        Thought Content: Thought content normal.        Judgment: Judgment normal.   SCANS:  CT scans of her abdomen/pelvis revealed the following: FINDINGS: Lower chest: Unremarkable.  Hepatobiliary: No suspicious focal abnormality within the liver parenchyma. Layering tiny calcified gallstones evident. No intrahepatic or extrahepatic biliary dilation.  Pancreas: No focal mass lesion. No dilatation of the main duct. No intraparenchymal cyst. No peripancreatic edema. Pancreas divisum anatomy evident.  Spleen: No splenomegaly. No focal mass lesion.  Adrenals/Urinary Tract: Right adrenal gland unremarkable. Tiny left adrenal nodule is stable tiny scar noted lower  pole right kidney. Left kidney unremarkable. No evidence for hydroureter. The urinary bladder appears normal for the degree of distention.  Stomach/Bowel: Stomach is unremarkable. No gastric wall thickening. No evidence of outlet obstruction. Duodenum is normally positioned as is the ligament of Treitz. No small bowel wall thickening. No small bowel dilatation. Small bowel anastomosis noted left pelvis. The terminal ileum is normal. The appendix is normal. No gross colonic mass. No colonic wall thickening. Left colonic anastomosis identified. Prominent stool volume throughout.  Vascular/Lymphatic: There is abdominal aortic atherosclerosis without aneurysm. There is no gastrohepatic or hepatoduodenal ligament lymphadenopathy. No retroperitoneal or mesenteric lymphadenopathy. No pelvic sidewall lymphadenopathy. Upper normal lymph nodes are seen in the left groin region.  Reproductive: Interval hysterectomy. Nonvisualization of the ovaries suggest associated oophorectomy.  Other: No intraperitoneal free fluid. No evidence for omental or peritoneal nodularity.  Musculoskeletal: Left abdominal wall hernia seen previously has been repaired in the interval with soft tissue attenuation in the subcutaneous fat of this region likely related to the surgery. No worrisome lytic or sclerotic osseous abnormality.  IMPRESSION: 1. Interval hysterectomy and bilateral oophorectomy. No evidence for recurrent or metastatic disease in the abdomen/pelvis. 2. Interval repair of left abdominal wall hernia with soft tissue attenuation in the subcutaneous fat of this region likely related to the surgery. 3. Prominent stool volume throughout the colon. Imaging features could be compatible with clinical constipation. 4. Tiny left adrenal nodule is unchanged, likely an adenoma. 5. Cholelithiasis. 6. Upper normal lymph nodes in the left groin region. Attention on follow-up recommended. 7. Aortic  Atherosclerosis (ICD10-I70.0).  LABS:    Ref. Range 05/08/2020 00:00  Sodium Latest Ref Range: 137 - 147  142  Potassium Latest Ref Range: 3.4 - 5.3  3.6  Chloride Latest Ref Range: 99 - 108  104  CO2 Latest Ref Range: 13 - 22  33 (A)  Glucose Unknown 107  BUN Latest Ref Range: 4 - 21  15  Creatinine Latest Ref Range: 0.5 - 1.1  0.7  Calcium Latest Ref Range: 8.7 - 10.7  9.2  Alkaline Phosphatase Latest Ref Range: 25 - 125  90  Albumin Latest Ref Range: 3.5 - 5.0  4.2  AST Latest Ref Range: 13 - 35  24  ALT Latest Ref Range: 7 - 35  10  Bilirubin, Total Unknown 0.8  WBC Unknown 5.1  RBC Latest Ref Range: 3.87 - 5.11  3.46 (A)  Hemoglobin Latest Ref Range: 12.0 - 16.0  11.8 (A)  HCT Latest Ref Range: 36 - 46  35 (A)  MCV Latest Ref Range: 81 - 99  102 (A)  Platelets Latest Ref Range: 150 - 399  156     ASSESSMENT & PLAN:  Assessment/Plan:  A 80 y.o. female with FIGO stage IVB ovarian cancer, status post 6 cycles  of carboplatin/paclitaxel, followed by TAHBSO/maximum debulking surgery.  She is currently on olaparib at 300 mg in the AM and 150 mg in the evening.  In clinic today, I went over her CT scan images with her, for which she remains disease-free.  Her physical exam and most recent CA-125 level also suggest she remains disease free.  Clinically, she continues to do very well.  I will see her back in 3 months for repeat clinical assessment.  The patient understands all the plans discussed today and is in agreement with them.    I, Rita Ohara, am acting as scribe for Marice Potter, MD    I have reviewed this report as typed by the medical scribe, and it is complete and accurate.  Dequincy Macarthur Critchley, MD

## 2020-07-08 ENCOUNTER — Inpatient Hospital Stay: Payer: Medicare Other

## 2020-07-08 DIAGNOSIS — K8020 Calculus of gallbladder without cholecystitis without obstruction: Secondary | ICD-10-CM | POA: Diagnosis not present

## 2020-07-08 DIAGNOSIS — E278 Other specified disorders of adrenal gland: Secondary | ICD-10-CM | POA: Diagnosis not present

## 2020-07-08 DIAGNOSIS — K6389 Other specified diseases of intestine: Secondary | ICD-10-CM | POA: Diagnosis not present

## 2020-07-08 DIAGNOSIS — C569 Malignant neoplasm of unspecified ovary: Secondary | ICD-10-CM | POA: Diagnosis not present

## 2020-07-08 DIAGNOSIS — C562 Malignant neoplasm of left ovary: Secondary | ICD-10-CM | POA: Diagnosis not present

## 2020-07-09 ENCOUNTER — Other Ambulatory Visit: Payer: Self-pay | Admitting: Oncology

## 2020-07-09 ENCOUNTER — Telehealth: Payer: Self-pay | Admitting: Oncology

## 2020-07-09 ENCOUNTER — Inpatient Hospital Stay: Payer: Medicare Other | Attending: Oncology | Admitting: Oncology

## 2020-07-09 VITALS — BP 202/81 | HR 77 | Temp 98.3°F | Resp 16 | Ht 62.0 in | Wt 139.5 lb

## 2020-07-09 DIAGNOSIS — C563 Malignant neoplasm of bilateral ovaries: Secondary | ICD-10-CM | POA: Diagnosis not present

## 2020-07-09 DIAGNOSIS — C562 Malignant neoplasm of left ovary: Secondary | ICD-10-CM

## 2020-07-09 NOTE — Telephone Encounter (Signed)
Per 5/17 LOS, patient scheduled for Aug Appt's.  Gave patient Appt Summary

## 2020-07-12 ENCOUNTER — Encounter: Payer: Self-pay | Admitting: Oncology

## 2020-07-23 DIAGNOSIS — I10 Essential (primary) hypertension: Secondary | ICD-10-CM | POA: Diagnosis not present

## 2020-07-24 DIAGNOSIS — I10 Essential (primary) hypertension: Secondary | ICD-10-CM | POA: Diagnosis not present

## 2020-07-24 DIAGNOSIS — F329 Major depressive disorder, single episode, unspecified: Secondary | ICD-10-CM | POA: Diagnosis not present

## 2020-07-24 DIAGNOSIS — E785 Hyperlipidemia, unspecified: Secondary | ICD-10-CM | POA: Diagnosis not present

## 2020-08-05 ENCOUNTER — Telehealth: Payer: Self-pay | Admitting: Internal Medicine

## 2020-08-05 NOTE — Telephone Encounter (Signed)
Pt is wanting lab orders faxed to primary care provider, Dr.Beth Nyra Capes. If possible if not pt can come in to have blood work. Pt's daughter would like a call back either way if possible or not.   Fax number:[phone]

## 2020-08-20 DIAGNOSIS — I10 Essential (primary) hypertension: Secondary | ICD-10-CM | POA: Diagnosis not present

## 2020-08-20 DIAGNOSIS — E0590 Thyrotoxicosis, unspecified without thyrotoxic crisis or storm: Secondary | ICD-10-CM | POA: Diagnosis not present

## 2020-08-21 NOTE — Telephone Encounter (Signed)
Is okay for patient to have labs at pcp office ,please advise

## 2020-08-22 NOTE — Telephone Encounter (Signed)
[facility] to send the order for TSH, free T4, free T3 to PCP via fax. Ty! C

## 2020-08-22 NOTE — Telephone Encounter (Signed)
Spoke w/ patient she said that she had labs draw on yesterday that  Dr.Hodge would be faxing the results to the office.

## 2020-08-23 DIAGNOSIS — E785 Hyperlipidemia, unspecified: Secondary | ICD-10-CM | POA: Diagnosis not present

## 2020-08-23 DIAGNOSIS — F329 Major depressive disorder, single episode, unspecified: Secondary | ICD-10-CM | POA: Diagnosis not present

## 2020-08-23 DIAGNOSIS — I10 Essential (primary) hypertension: Secondary | ICD-10-CM | POA: Diagnosis not present

## 2020-08-28 DIAGNOSIS — E0590 Thyrotoxicosis, unspecified without thyrotoxic crisis or storm: Secondary | ICD-10-CM | POA: Diagnosis not present

## 2020-08-28 DIAGNOSIS — E785 Hyperlipidemia, unspecified: Secondary | ICD-10-CM | POA: Diagnosis not present

## 2020-08-28 DIAGNOSIS — I4891 Unspecified atrial fibrillation: Secondary | ICD-10-CM | POA: Diagnosis not present

## 2020-08-28 DIAGNOSIS — I10 Essential (primary) hypertension: Secondary | ICD-10-CM | POA: Diagnosis not present

## 2020-08-30 ENCOUNTER — Other Ambulatory Visit: Payer: Self-pay | Admitting: Internal Medicine

## 2020-08-30 ENCOUNTER — Telehealth: Payer: Self-pay

## 2020-08-30 NOTE — Progress Notes (Signed)
Received labs from PCP from 08/28/2020.  Patient had the following TFTs: TSH 0.022 (0.45-4.5), free T4 1.42 (0.82-1.77) She is on methimazole 2.5 mg daily.  We will increase the dose to 5 mg daily. Will need repeat set of TFTs in 1.5 months.

## 2020-08-30 NOTE — Telephone Encounter (Addendum)
-----   Message from Philemon Kingdom, MD sent at 08/30/2020  5:25 PM EDT ----- Can you please call pt.:  I received labs from patient's PCP from 08/28/2020.  Her TSH was too low, at 0.022.  We need to increase methimazole to 5 mg daily.  Afterwards, lets have her back for labs in 1.5 months.  Labs are in.  Pt contacted and verbalized understanding. Will call back Monday to schedule lab appt.

## 2020-09-09 NOTE — Telephone Encounter (Signed)
LM to schedule lab appt for 6 weeks

## 2020-09-23 DIAGNOSIS — I10 Essential (primary) hypertension: Secondary | ICD-10-CM | POA: Diagnosis not present

## 2020-09-23 DIAGNOSIS — F329 Major depressive disorder, single episode, unspecified: Secondary | ICD-10-CM | POA: Diagnosis not present

## 2020-09-23 DIAGNOSIS — E785 Hyperlipidemia, unspecified: Secondary | ICD-10-CM | POA: Diagnosis not present

## 2020-09-26 DIAGNOSIS — C44529 Squamous cell carcinoma of skin of other part of trunk: Secondary | ICD-10-CM | POA: Diagnosis not present

## 2020-09-26 DIAGNOSIS — L570 Actinic keratosis: Secondary | ICD-10-CM | POA: Diagnosis not present

## 2020-09-26 DIAGNOSIS — L578 Other skin changes due to chronic exposure to nonionizing radiation: Secondary | ICD-10-CM | POA: Diagnosis not present

## 2020-09-26 DIAGNOSIS — L821 Other seborrheic keratosis: Secondary | ICD-10-CM | POA: Diagnosis not present

## 2020-09-26 DIAGNOSIS — C4442 Squamous cell carcinoma of skin of scalp and neck: Secondary | ICD-10-CM | POA: Diagnosis not present

## 2020-10-04 NOTE — Progress Notes (Signed)
[place]  [address] [place],  [place]  [address] [phone]  Clinic Day:  10/11/2020  Referring physician: Marco Collie, MD  This document serves as a record of services personally performed by Derisha Funderburke Macarthur Critchley, MD. It was created on their behalf by [facility] E, a trained medical scribe. The creation of this record is based on the scribe's personal observations and the provider's statements to them.  HISTORY OF PRESENT ILLNESS:  The patient is a 80 y.o. female with with FIGO stage IVB ovarian cancer, which included peritoneal disease and a metastatic nodule over her left abdominal wall.  She underwent 6 cycles of carboplatin/paclitaxel before undergoing a TAHBSO/maximum debulking surgery, as well as abdominal hernia surgery, in August 2021.  Over these past months, the patient has been taking olaparib to prevent early disease recurrence from her ovarian cancer.   Her current dose is 300 mg AM and 150 mg QPM.  She comes in today for routine follow-up.  Overall, the patient has been doing well.  From an ovarian cancer standpoint, she denies having any new symptoms/findings which concern her for disease recurrence.     PHYSICAL EXAM:  Blood pressure (!) 183/81, pulse 69, temperature 98.2 F (36.8 C), resp. rate 16, height 5\' 2"  (1.575 m), weight 142 lb 9.6 oz (64.7 kg), SpO2 96 %. Wt Readings from Last 3 Encounters:  10/11/20 142 lb 9.6 oz (64.7 kg)  07/09/20 139 lb 8 oz (63.3 kg)  07/04/20 139 lb (63 kg)   Body mass index is 26.08 kg/m. Performance status (ECOG): 1 Physical Exam Constitutional:      Appearance: Normal appearance.  HENT:     Mouth/Throat:     Pharynx: Oropharynx is clear. No oropharyngeal exudate.  Cardiovascular:     Rate and Rhythm: Normal rate and regular rhythm.     Heart sounds: No murmur heard.   No friction rub. No gallop.  Pulmonary:     Breath sounds: Normal breath sounds.  Abdominal:     General: Bowel sounds  are normal. There is no distension.     Palpations: Abdomen is soft. There is no mass.     Tenderness: There is no abdominal tenderness.  Musculoskeletal:        General: No tenderness.     Cervical back: Normal range of motion and neck supple.     Right lower leg: No edema.     Left lower leg: No edema.  Lymphadenopathy:     Cervical: No cervical adenopathy.     Right cervical: No superficial, deep or posterior cervical adenopathy.    Left cervical: No superficial, deep or posterior cervical adenopathy.     Upper Body:     Right upper body: No supraclavicular or axillary adenopathy.     Left upper body: No supraclavicular or axillary adenopathy.     Lower Body: No right inguinal adenopathy. No left inguinal adenopathy.  Skin:    Coloration: Skin is not jaundiced.     Findings: No lesion or rash.  Neurological:     General: No focal deficit present.     Mental Status: She is alert and oriented to person, place, and time. Mental status is at baseline.  Psychiatric:        Mood and Affect: Mood normal.        Behavior: Behavior normal.        Thought Content: Thought content normal.        Judgment: Judgment normal.  LABS:    Ref. Range 10/10/2020 00:00  Sodium Latest Ref Range: 137 - 147  139  Potassium Latest Ref Range: 3.4 - 5.3  3.9  Chloride Latest Ref Range: 99 - 108  103  CO2 Latest Ref Range: 13 - 22  30 (A)  Glucose Unknown 129  BUN Latest Ref Range: 4 - 21  18  Creatinine Latest Ref Range: 0.5 - 1.1  0.9  Calcium Latest Ref Range: 8.7 - 10.7  8.7  Alkaline Phosphatase Latest Ref Range: 25 - 125  80  Albumin Latest Ref Range: 3.5 - 5.0  3.9  AST Latest Ref Range: 13 - 35  21  ALT Latest Ref Range: 7 - 35  11  Bilirubin, Total Unknown 0.7    Ref. Range 10/10/2020 00:00  WBC Unknown 4.9  RBC Latest Ref Range: 3.87 - 5.11  3.02 (A)  Hemoglobin Latest Ref Range: 12.0 - 16.0  11.4 (A)  HCT Latest Ref Range: 36 - 46  34 (A)  Platelets Latest Ref Range: 150 - 399   139 (A)    Ref. Range 10/10/2020 15:50  Cancer Antigen (CA) 125 Latest Ref Range: 0.0 - 38.1 U/mL 23.8     ASSESSMENT & PLAN:  Assessment/Plan:  A 80 y.o. female with FIGO stage IVB ovarian cancer, status post 6 cycles of carboplatin/paclitaxel, followed by TAHBSO/maximum debulking surgery.  She is currently on olaparib at 300 mg in the AM and 150 mg in the evening.  Her physical exam and most recent CA-125 level also suggest she remains disease free.  Clinically, she continues to do very well.  I will see her back in 3 months for repeat clinical assessment.  CT scans will be done before her next visit to ensure there remains no radiographic evidence of disease recurrence.  The patient understands all the plans discussed today and is in agreement with them.    I, Rita Ohara, am acting as scribe for Marice Potter, MD    I have reviewed this report as typed by the medical scribe, and it is complete and accurate.  Adianna Darwin Macarthur Critchley, MD

## 2020-10-08 ENCOUNTER — Telehealth: Payer: Self-pay | Admitting: Internal Medicine

## 2020-10-08 NOTE — Telephone Encounter (Signed)
Called and advised pt due to lab appt 10/09/20 to check levels we will hold off on sending in a Rx today until we know if any adjustments need to be made. Pt verbalized understanding

## 2020-10-08 NOTE — Telephone Encounter (Signed)
Got verbal permission to speak with daughter. Daughter is stating that primary care can not fill this medication due to her seeing [place]. Pt is needing a medication refill on  methimazole (TAPAZOLE) 5 MG tablet   [place] [place] - [address]

## 2020-10-09 ENCOUNTER — Other Ambulatory Visit: Payer: Self-pay

## 2020-10-09 ENCOUNTER — Other Ambulatory Visit (INDEPENDENT_AMBULATORY_CARE_PROVIDER_SITE_OTHER): Payer: Medicare Other

## 2020-10-09 DIAGNOSIS — E0590 Thyrotoxicosis, unspecified without thyrotoxic crisis or storm: Secondary | ICD-10-CM

## 2020-10-09 LAB — T3, FREE: T3, Free: 3 pg/mL (ref 2.3–4.2)

## 2020-10-09 LAB — TSH: TSH: 3.81 u[IU]/mL (ref 0.35–5.50)

## 2020-10-09 LAB — T4, FREE: Free T4: 0.59 ng/dL — ABNORMAL LOW (ref 0.60–1.60)

## 2020-10-09 NOTE — Telephone Encounter (Signed)
Pt's daughter would like a call back.DPR is on file. Pt is completely out of medication  Megan Waller [phone]

## 2020-10-10 ENCOUNTER — Encounter: Payer: Self-pay | Admitting: Hematology and Oncology

## 2020-10-10 ENCOUNTER — Other Ambulatory Visit: Payer: Self-pay | Admitting: Internal Medicine

## 2020-10-10 ENCOUNTER — Other Ambulatory Visit: Payer: Self-pay | Admitting: Hematology and Oncology

## 2020-10-10 ENCOUNTER — Inpatient Hospital Stay: Payer: Medicare Other | Attending: Oncology

## 2020-10-10 DIAGNOSIS — C562 Malignant neoplasm of left ovary: Secondary | ICD-10-CM

## 2020-10-10 DIAGNOSIS — C569 Malignant neoplasm of unspecified ovary: Secondary | ICD-10-CM | POA: Insufficient documentation

## 2020-10-10 LAB — CBC: RBC: 3.02 — AB (ref 3.87–5.11)

## 2020-10-10 LAB — BASIC METABOLIC PANEL
BUN: 18 (ref 4–21)
CO2: 30 — AB (ref 13–22)
Chloride: 103 (ref 99–108)
Creatinine: 0.9 (ref 0.5–1.1)
Glucose: 129
Potassium: 3.9 (ref 3.4–5.3)
Sodium: 139 (ref 137–147)

## 2020-10-10 LAB — CBC AND DIFFERENTIAL
HCT: 34 — AB (ref 36–46)
Hemoglobin: 11.4 — AB (ref 12.0–16.0)
Neutrophils Absolute: 2.74
Platelets: 139 — AB (ref 150–399)
WBC: 4.9

## 2020-10-10 LAB — HEPATIC FUNCTION PANEL
ALT: 11 (ref 7–35)
AST: 21 (ref 13–35)
Alkaline Phosphatase: 80 (ref 25–125)
Bilirubin, Total: 0.7

## 2020-10-10 LAB — COMPREHENSIVE METABOLIC PANEL
Albumin: 3.9 (ref 3.5–5.0)
Calcium: 8.7 (ref 8.7–10.7)

## 2020-10-10 MED ORDER — METHIMAZOLE 5 MG PO TABS: 2.5000 mg | ORAL_TABLET | Freq: Every day | ORAL | 3 refills | Status: DC

## 2020-10-10 NOTE — Telephone Encounter (Signed)
Called and advised pt Rx was sent to preferred pharmacy earlier today.

## 2020-10-11 ENCOUNTER — Other Ambulatory Visit: Payer: Self-pay | Admitting: Oncology

## 2020-10-11 ENCOUNTER — Inpatient Hospital Stay (INDEPENDENT_AMBULATORY_CARE_PROVIDER_SITE_OTHER): Payer: Medicare Other | Admitting: Oncology

## 2020-10-11 VITALS — BP 183/81 | HR 69 | Temp 98.2°F | Resp 16 | Ht 62.0 in | Wt 142.6 lb

## 2020-10-11 DIAGNOSIS — C563 Malignant neoplasm of bilateral ovaries: Secondary | ICD-10-CM

## 2020-10-11 DIAGNOSIS — C562 Malignant neoplasm of left ovary: Secondary | ICD-10-CM

## 2020-10-11 LAB — CA 125: Cancer Antigen (CA) 125: 23.8 U/mL (ref 0.0–38.1)

## 2020-10-19 ENCOUNTER — Encounter: Payer: Self-pay | Admitting: Oncology

## 2020-10-24 DIAGNOSIS — I10 Essential (primary) hypertension: Secondary | ICD-10-CM | POA: Diagnosis not present

## 2020-10-24 DIAGNOSIS — E785 Hyperlipidemia, unspecified: Secondary | ICD-10-CM | POA: Diagnosis not present

## 2020-10-24 DIAGNOSIS — F329 Major depressive disorder, single episode, unspecified: Secondary | ICD-10-CM | POA: Diagnosis not present

## 2020-10-31 NOTE — Telephone Encounter (Signed)
Na

## 2020-11-12 ENCOUNTER — Telehealth: Payer: Self-pay

## 2020-11-12 NOTE — Telephone Encounter (Signed)
Pt's daughter,Amanda, called to ask for an appointment. She states her mom has new area on her stomach that looks similar to the last one, which was cancer. [name] states it has been there about 2 weeks. No redness, no warmth, but is a little tender. Afebrile.  I notified Dr Bobby Rumpf of above. He agreed to see pt. Appt made.

## 2020-11-15 NOTE — Progress Notes (Signed)
[place]  [address] [place],  [place]  [address] [phone]  Clinic Day:  11/18/2020  Referring physician: Marco Collie, MD  This document serves as a record of services personally performed by Kittie Krizan Macarthur Critchley, MD. It was created on their behalf by [facility] E, a trained medical scribe. The creation of this record is based on the scribe's personal observations and the provider's statements to them.  HISTORY OF PRESENT ILLNESS:  The patient is a 80 y.o. female with with FIGO stage IVB ovarian cancer, which included peritoneal disease and a metastatic nodule over her left abdominal wall.  She underwent 6 cycles of carboplatin/paclitaxel before undergoing a TAHBSO/maximum debulking surgery, as well as abdominal hernia surgery, in August 2021.  Over these past months, the patient has been taking olaparib to prevent early disease recurrence from her ovarian cancer.   Her current dose is 300 mg AM and 150 mg QPM.  She comes in today off-schedule as she has noticed a new skin nodule develop over her abdomen.  It appears to be in the same area where her initial skin lesion, which proved to be metastatic ovarian cancer, was resected.  She denies having other skin lesions.  She has also noticed mild early satiety.  PHYSICAL EXAM:  Blood pressure (!) 180/72, pulse 72, temperature 98.2 F (36.8 C), temperature source Oral, resp. rate 20, height 5\' 2"  (1.575 m), weight 144 lb 9.6 oz (65.6 kg), SpO2 97 %. Wt Readings from Last 3 Encounters:  11/18/20 144 lb 9.6 oz (65.6 kg)  10/11/20 142 lb 9.6 oz (64.7 kg)  07/09/20 139 lb 8 oz (63.3 kg)   Body mass index is 26.45 kg/m. Performance status (ECOG): 1 Physical Exam Constitutional:      Appearance: Normal appearance.  HENT:     Mouth/Throat:     Pharynx: Oropharynx is clear. No oropharyngeal exudate.  Cardiovascular:     Rate and Rhythm: Normal rate and regular rhythm.     Heart sounds: No murmur  heard.   No friction rub. No gallop.  Pulmonary:     Breath sounds: Normal breath sounds.  Abdominal:     General: Bowel sounds are normal. There is no distension.     Palpations: Abdomen is soft. There is no mass.     Tenderness: There is no abdominal tenderness.  Musculoskeletal:        General: No tenderness.     Cervical back: Normal range of motion and neck supple.     Right lower leg: No edema.     Left lower leg: No edema.  Lymphadenopathy:     Cervical: No cervical adenopathy.     Right cervical: No superficial, deep or posterior cervical adenopathy.    Left cervical: No superficial, deep or posterior cervical adenopathy.     Upper Body:     Right upper body: No supraclavicular or axillary adenopathy.     Left upper body: No supraclavicular or axillary adenopathy.     Lower Body: No right inguinal adenopathy. No left inguinal adenopathy.  Skin:    Coloration: Skin is not jaundiced.     Findings: Lesion present. No rash.     Comments: 2 cm flesh-colored nodule with mild erythema just left of abdominal midline  Neurological:     General: No focal deficit present.     Mental Status: She is alert and oriented to person, place, and time. Mental status is at baseline.  Psychiatric:  Mood and Affect: Mood normal.        Behavior: Behavior normal.        Thought Content: Thought content normal.        Judgment: Judgment normal.    ASSESSMENT & PLAN:  Assessment/Plan:  A 80 y.o. female with FIGO stage IVB ovarian cancer, status post 6 cycles of carboplatin/paclitaxel, followed by TAHBSO/maximum debulking surgery.  She is currently on olaparib at 300 mg in the AM and 150 mg in the evening.  The nodule on her abdomen does have me concerned for possible cutaneous recurrence of her ovarian cancer.  I will have her see Dr Coralie Keens later this week so he can biopsy the area in question.  I will see her back in 2 weeks to go over the biopsy results and their implications.  The  patient understands all the plans discussed today and is in agreement with them.    I, Rita Ohara, am acting as scribe for Marice Potter, MD    I have reviewed this report as typed by the medical scribe, and it is complete and accurate.  Eydan Chianese Macarthur Critchley, MD

## 2020-11-18 ENCOUNTER — Encounter: Payer: Self-pay | Admitting: Oncology

## 2020-11-18 ENCOUNTER — Inpatient Hospital Stay: Payer: Medicare Other | Attending: Oncology | Admitting: Oncology

## 2020-11-18 VITALS — BP 180/72 | HR 72 | Temp 98.2°F | Resp 20 | Ht 62.0 in | Wt 144.6 lb

## 2020-11-18 DIAGNOSIS — C563 Malignant neoplasm of bilateral ovaries: Secondary | ICD-10-CM

## 2020-11-20 DIAGNOSIS — C569 Malignant neoplasm of unspecified ovary: Secondary | ICD-10-CM | POA: Diagnosis not present

## 2020-11-20 DIAGNOSIS — C792 Secondary malignant neoplasm of skin: Secondary | ICD-10-CM | POA: Diagnosis not present

## 2020-11-20 DIAGNOSIS — R229 Localized swelling, mass and lump, unspecified: Secondary | ICD-10-CM | POA: Diagnosis not present

## 2020-11-22 DIAGNOSIS — I10 Essential (primary) hypertension: Secondary | ICD-10-CM | POA: Diagnosis not present

## 2020-11-23 DIAGNOSIS — I10 Essential (primary) hypertension: Secondary | ICD-10-CM | POA: Diagnosis not present

## 2020-11-23 DIAGNOSIS — F329 Major depressive disorder, single episode, unspecified: Secondary | ICD-10-CM | POA: Diagnosis not present

## 2020-11-23 DIAGNOSIS — E785 Hyperlipidemia, unspecified: Secondary | ICD-10-CM | POA: Diagnosis not present

## 2020-11-26 ENCOUNTER — Other Ambulatory Visit: Payer: Self-pay | Admitting: Oncology

## 2020-11-26 DIAGNOSIS — C562 Malignant neoplasm of left ovary: Secondary | ICD-10-CM

## 2020-11-27 ENCOUNTER — Telehealth: Payer: Self-pay | Admitting: Oncology

## 2020-11-27 NOTE — Telephone Encounter (Signed)
11/27/20 Spoke with patient and scheduled ct scans

## 2020-11-27 NOTE — Progress Notes (Signed)
[place]  [address] [place],  [place]  [address] [phone]  Clinic Day:  12/02/2020  Referring physician: Marco Collie, MD  This document serves as a record of services personally performed by Dequincy Macarthur Critchley, MD. It was created on their behalf by [facility] E, a trained medical scribe. The creation of this record is based on the scribe's personal observations and the provider's statements to them.  HISTORY OF PRESENT ILLNESS:  The patient is a 80 y.o. female with with FIGO stage IVB ovarian cancer.  Recently, an abdominal nodule redeveloped, which was biopsy-proven to be recurrent ovarian cancer.  Based upon this finding, she comes in today to go over her CT scans, as well as to discuss what next to do for her disease management.  Despite her disease recurrence, the patient has been doing well.  She denies having any abdominal pain or other symptoms which concern her for overt signs of disease recurrence.    With respect to her ovarian cancer, she initially presented with eritoneal disease and a metastatic nodule over her left abdominal wall.  She underwent 6 cycles of carboplatin/paclitaxel before undergoing a TAHBSO/maximum debulking surgery in August 2021.  Over these past months, the patient was taking olaparib to prevent early disease recurrence from her ovarian cancer.   Olaparib was dosed at 300 mg qAM and 150 mg qPM.    PHYSICAL EXAM:  Blood pressure (!) 211/84, pulse 70, temperature 98.2 F (36.8 C), resp. rate 14, height 5\' 2"  (1.575 m), weight 144 lb 4.8 oz (65.5 kg), SpO2 97 %. Wt Readings from Last 3 Encounters:  12/02/20 144 lb 4.8 oz (65.5 kg)  11/18/20 144 lb 9.6 oz (65.6 kg)  10/11/20 142 lb 9.6 oz (64.7 kg)   Body mass index is 26.39 kg/m. Performance status (ECOG): 1 Physical Exam Constitutional:      General: She is not in acute distress.    Appearance: Normal appearance. She is normal weight.  HENT:     Head:  Normocephalic and atraumatic.  Eyes:     General: No scleral icterus.    Extraocular Movements: Extraocular movements intact.     Conjunctiva/sclera: Conjunctivae normal.     Pupils: Pupils are equal, round, and reactive to light.  Cardiovascular:     Rate and Rhythm: Normal rate and regular rhythm.     Pulses: Normal pulses.     Heart sounds: Normal heart sounds. No murmur heard.   No friction rub. No gallop.  Pulmonary:     Effort: Pulmonary effort is normal. No respiratory distress.     Breath sounds: Normal breath sounds.  Abdominal:     General: Bowel sounds are normal. There is no distension.     Palpations: Abdomen is soft. There is no hepatomegaly, splenomegaly or mass.     Tenderness: There is no abdominal tenderness.  Musculoskeletal:        General: Normal range of motion.     Cervical back: Normal range of motion and neck supple.     Right lower leg: No edema.     Left lower leg: No edema.  Lymphadenopathy:     Cervical: No cervical adenopathy.  Skin:    General: Skin is warm and dry.  Neurological:     General: No focal deficit present.     Mental Status: She is alert and oriented to person, place, and time. Mental status is at baseline.  Psychiatric:  Mood and Affect: Mood normal.        Behavior: Behavior normal.        Thought Content: Thought content normal.        Judgment: Judgment normal.   PATHOLOGY:  Her abdominal nodule biopsy revealed the following:  SCANS:  CT scans of her abdomen/pelvis revealed the following: FINDINGS:  Lower chest: Clear lung bases. No significant pleural or pericardial  effusion. Mild aortic atherosclerosis.  Hepatobiliary: No intraparenchymal metastases are identified.  However, there is a probable peritoneal implant along the lateral  margin of the right hepatic lobe, measuring 2.4 x 0.8 cm on image  21/2. Multiple small gallstones are present. No evidence of  gallbladder wall thickening or biliary dilatation.   Pancreas: Unremarkable. No pancreatic ductal dilatation or  surrounding inflammatory changes.  Spleen: Normal in size without focal abnormality.  Adrenals/Urinary Tract: Stable tiny left adrenal nodule, consistent  with an adenoma. The right adrenal gland appears normal. Stable  small cyst in the lower pole of the right kidney. No evidence of  renal mass, urinary tract calculus or hydronephrosis. The bladder  appears unremarkable.  Stomach/Bowel: Enteric contrast was administered and has passed into  the distal colon. The stomach appears unremarkable for its degree of  distension. No evidence of bowel wall thickening, distention or  surrounding inflammatory change. The appendix appears normal. The  rectosigmoid anastomosis appears patent. Vascular/Lymphatic: There is a newly enlarged right internal iliac  node measuring 1.3 x 2.3 cm on image 56/2. More inferiorly, there is  an enlarged right external iliac node demonstrating a short axis  dimension of 1.7 cm on image 61/2. There is an enlarged left  inguinal node measuring 2.3 x 2.1 cm on image 70/2. Diffuse aortic  and branch vessel atherosclerosis without acute vascular findings.  The portal, superior mesenteric and splenic veins are patent.  Reproductive: Hysterectomy. No adnexal mass.  Other: There are postsurgical changes in the left anterior abdominal  wall with ill-defined fluid and scarring. However, there is  increased soft tissue nodularity in this region suspicious for tumor  implants. In addition, there are multiple new peritoneal implants,  including a 12 mm left omental implant on image 42/2, a left pelvic  implant measuring 2.3 x 1.3 cm on image 55/2 and the subcapsular  right hepatic lesion described above. No generalized ascites.  Musculoskeletal: No acute or significant osseous findings. Stable  degenerative changes within the thoracolumbar spine.   IMPRESSION:  1. New multifocal peritoneal nodularity, right pelvic   lymphadenopathy and increased nodularity within the left anterior  abdominal wall, highly suspicious for recurrent ovarian cancer. A  left inguinal mass is most amenable to percutaneous sampling if  clinically necessary.  2. No evidence of solid parenchymal metastasis, bowel or ureteral  obstruction.  3. Cholelithiasis without evidence of cholecystitis.  4. Aortic Atherosclerosis (ICD10-I70.0).    ASSESSMENT & PLAN:  Assessment/Plan:  A 80 y.o. female who unfortunately has recurrent/metastatic ovarian cancer.  In clinic today, I went over her biopsy and scan results, for which she understands her disease is now back.  As it has been well beyond 6 months since her disease has come back, she is considered to still have platinum-sensitive disease.  Based upon this, I will place her on another platinum-based regiment, which will be carboplatin/Doxil.  Carboplatin will be given at an AUC of 5, while Doxil will be given at 30 mg/m2.  She was made aware of the side effects that go along with this regimen,  including cytopenias, alopecia, hand stiffness and redness.  Each cycle will be repeated every 4 weeks.  Her first cycle will commence on Friday, October 14th.  I will see her back 4 weeks later before she heads into her 2nd cycle of carboplatin/Doxil.  The patient understands all the plans discussed today and is in agreement with them.    I, Rita Ohara, am acting as scribe for Marice Potter, MD    I have reviewed this report as typed by the medical scribe, and it is complete and accurate.  Dequincy Macarthur Critchley, MD

## 2020-11-28 ENCOUNTER — Encounter: Payer: Self-pay | Admitting: Oncology

## 2020-11-28 DIAGNOSIS — C786 Secondary malignant neoplasm of retroperitoneum and peritoneum: Secondary | ICD-10-CM | POA: Diagnosis not present

## 2020-11-28 DIAGNOSIS — I700 Atherosclerosis of aorta: Secondary | ICD-10-CM | POA: Diagnosis not present

## 2020-11-28 DIAGNOSIS — C562 Malignant neoplasm of left ovary: Secondary | ICD-10-CM | POA: Diagnosis not present

## 2020-11-28 DIAGNOSIS — N281 Cyst of kidney, acquired: Secondary | ICD-10-CM | POA: Diagnosis not present

## 2020-11-28 DIAGNOSIS — Z8543 Personal history of malignant neoplasm of ovary: Secondary | ICD-10-CM | POA: Diagnosis not present

## 2020-11-28 DIAGNOSIS — K8020 Calculus of gallbladder without cholecystitis without obstruction: Secondary | ICD-10-CM | POA: Diagnosis not present

## 2020-12-02 ENCOUNTER — Other Ambulatory Visit: Payer: Self-pay

## 2020-12-02 ENCOUNTER — Inpatient Hospital Stay: Payer: Medicare Other | Attending: Oncology | Admitting: Oncology

## 2020-12-02 ENCOUNTER — Other Ambulatory Visit: Payer: Self-pay | Admitting: Oncology

## 2020-12-02 VITALS — BP 211/84 | HR 70 | Temp 98.2°F | Resp 14 | Ht 62.0 in | Wt 144.3 lb

## 2020-12-02 DIAGNOSIS — Z79899 Other long term (current) drug therapy: Secondary | ICD-10-CM | POA: Insufficient documentation

## 2020-12-02 DIAGNOSIS — I10 Essential (primary) hypertension: Secondary | ICD-10-CM | POA: Insufficient documentation

## 2020-12-02 DIAGNOSIS — C563 Malignant neoplasm of bilateral ovaries: Secondary | ICD-10-CM

## 2020-12-02 DIAGNOSIS — J449 Chronic obstructive pulmonary disease, unspecified: Secondary | ICD-10-CM | POA: Insufficient documentation

## 2020-12-02 DIAGNOSIS — D509 Iron deficiency anemia, unspecified: Secondary | ICD-10-CM | POA: Insufficient documentation

## 2020-12-02 DIAGNOSIS — C562 Malignant neoplasm of left ovary: Secondary | ICD-10-CM | POA: Insufficient documentation

## 2020-12-02 DIAGNOSIS — K5900 Constipation, unspecified: Secondary | ICD-10-CM | POA: Insufficient documentation

## 2020-12-02 DIAGNOSIS — Z5111 Encounter for antineoplastic chemotherapy: Secondary | ICD-10-CM | POA: Insufficient documentation

## 2020-12-02 DIAGNOSIS — G20 Parkinson's disease: Secondary | ICD-10-CM | POA: Insufficient documentation

## 2020-12-02 DIAGNOSIS — Z800 Family history of malignant neoplasm of digestive organs: Secondary | ICD-10-CM | POA: Insufficient documentation

## 2020-12-02 DIAGNOSIS — C786 Secondary malignant neoplasm of retroperitoneum and peritoneum: Secondary | ICD-10-CM | POA: Insufficient documentation

## 2020-12-02 DIAGNOSIS — I700 Atherosclerosis of aorta: Secondary | ICD-10-CM | POA: Insufficient documentation

## 2020-12-02 DIAGNOSIS — R112 Nausea with vomiting, unspecified: Secondary | ICD-10-CM | POA: Insufficient documentation

## 2020-12-02 DIAGNOSIS — E876 Hypokalemia: Secondary | ICD-10-CM | POA: Insufficient documentation

## 2020-12-02 DIAGNOSIS — Z808 Family history of malignant neoplasm of other organs or systems: Secondary | ICD-10-CM | POA: Insufficient documentation

## 2020-12-02 DIAGNOSIS — Z803 Family history of malignant neoplasm of breast: Secondary | ICD-10-CM | POA: Insufficient documentation

## 2020-12-02 DIAGNOSIS — E7800 Pure hypercholesterolemia, unspecified: Secondary | ICD-10-CM | POA: Insufficient documentation

## 2020-12-02 DIAGNOSIS — R634 Abnormal weight loss: Secondary | ICD-10-CM | POA: Insufficient documentation

## 2020-12-02 DIAGNOSIS — R630 Anorexia: Secondary | ICD-10-CM | POA: Insufficient documentation

## 2020-12-02 DIAGNOSIS — K1230 Oral mucositis (ulcerative), unspecified: Secondary | ICD-10-CM | POA: Insufficient documentation

## 2020-12-02 DIAGNOSIS — R197 Diarrhea, unspecified: Secondary | ICD-10-CM | POA: Insufficient documentation

## 2020-12-02 DIAGNOSIS — G629 Polyneuropathy, unspecified: Secondary | ICD-10-CM | POA: Insufficient documentation

## 2020-12-03 ENCOUNTER — Telehealth: Payer: Self-pay | Admitting: Oncology

## 2020-12-03 NOTE — Telephone Encounter (Signed)
Patient's daughter called back - patient scheduled for 10/14 Infusion 10:30 am

## 2020-12-03 NOTE — Telephone Encounter (Signed)
Per 10/10 LOS, patient scheduled 10/12 Echo checking in at [facility] 9:30 am - 11/10 Labs, Follow Up  Waiting Plan of Care to be entered for Chemo to schedule

## 2020-12-04 ENCOUNTER — Encounter: Payer: Self-pay | Admitting: Oncology

## 2020-12-04 ENCOUNTER — Telehealth: Payer: Self-pay | Admitting: Hematology and Oncology

## 2020-12-04 ENCOUNTER — Other Ambulatory Visit: Payer: Medicare Other | Admitting: Hematology and Oncology

## 2020-12-04 DIAGNOSIS — C569 Malignant neoplasm of unspecified ovary: Secondary | ICD-10-CM | POA: Diagnosis not present

## 2020-12-04 DIAGNOSIS — I358 Other nonrheumatic aortic valve disorders: Secondary | ICD-10-CM | POA: Diagnosis not present

## 2020-12-04 DIAGNOSIS — T451X1A Poisoning by antineoplastic and immunosuppressive drugs, accidental (unintentional), initial encounter: Secondary | ICD-10-CM | POA: Diagnosis not present

## 2020-12-04 DIAGNOSIS — I361 Nonrheumatic tricuspid (valve) insufficiency: Secondary | ICD-10-CM

## 2020-12-04 DIAGNOSIS — I071 Rheumatic tricuspid insufficiency: Secondary | ICD-10-CM | POA: Diagnosis not present

## 2020-12-04 DIAGNOSIS — I10 Essential (primary) hypertension: Secondary | ICD-10-CM | POA: Diagnosis not present

## 2020-12-04 NOTE — Telephone Encounter (Signed)
Called daughter to give Chemo Edu Appt - She rescheduled to 10/14 at 10:00 am right before treatment

## 2020-12-05 ENCOUNTER — Encounter: Payer: Self-pay | Admitting: Oncology

## 2020-12-05 MED FILL — Dexamethasone Sodium Phosphate Inj 100 MG/10ML: INTRAMUSCULAR | Qty: 1 | Status: AC

## 2020-12-05 MED FILL — Fosaprepitant Dimeglumine For IV Infusion 150 MG (Base Eq): INTRAVENOUS | Qty: 5 | Status: AC

## 2020-12-05 MED FILL — Doxorubicin HCl Liposomal Inj (For IV Infusion) 2 MG/ML: INTRAVENOUS | Qty: 25 | Status: AC

## 2020-12-05 NOTE — Progress Notes (Signed)
START ON PATHWAY REGIMEN - Ovarian     A cycle is every 28 days:     Carboplatin      Liposomal doxorubicin   **Always confirm dose/schedule in your pharmacy ordering system**  Patient Characteristics: Recurrent or Progressive Disease, Second Line, Platinum Sensitive and ? 6 Months Since Last Therapy, Not a Candidate for Secondary Debulking Surgery BRCA Mutation Status: Present (Somatic) Therapeutic Status: Recurrent or Progressive Disease Line of Therapy: Second Line  Intent of Therapy: Non-Curative / Palliative Intent, Discussed with Patient 

## 2020-12-06 ENCOUNTER — Telehealth: Payer: Self-pay

## 2020-12-06 ENCOUNTER — Other Ambulatory Visit: Payer: Self-pay

## 2020-12-06 ENCOUNTER — Inpatient Hospital Stay (INDEPENDENT_AMBULATORY_CARE_PROVIDER_SITE_OTHER): Payer: Medicare Other | Admitting: Hematology and Oncology

## 2020-12-06 ENCOUNTER — Encounter: Payer: Self-pay | Admitting: Hematology and Oncology

## 2020-12-06 ENCOUNTER — Inpatient Hospital Stay: Payer: Medicare Other

## 2020-12-06 VITALS — HR 69 | Temp 98.9°F | Resp 18 | Ht 62.0 in | Wt 146.0 lb

## 2020-12-06 DIAGNOSIS — E7800 Pure hypercholesterolemia, unspecified: Secondary | ICD-10-CM | POA: Diagnosis not present

## 2020-12-06 DIAGNOSIS — Z803 Family history of malignant neoplasm of breast: Secondary | ICD-10-CM | POA: Diagnosis not present

## 2020-12-06 DIAGNOSIS — D509 Iron deficiency anemia, unspecified: Secondary | ICD-10-CM | POA: Diagnosis not present

## 2020-12-06 DIAGNOSIS — R634 Abnormal weight loss: Secondary | ICD-10-CM | POA: Diagnosis not present

## 2020-12-06 DIAGNOSIS — Z5111 Encounter for antineoplastic chemotherapy: Secondary | ICD-10-CM | POA: Diagnosis not present

## 2020-12-06 DIAGNOSIS — I10 Essential (primary) hypertension: Secondary | ICD-10-CM | POA: Diagnosis not present

## 2020-12-06 DIAGNOSIS — E876 Hypokalemia: Secondary | ICD-10-CM | POA: Diagnosis not present

## 2020-12-06 DIAGNOSIS — R630 Anorexia: Secondary | ICD-10-CM | POA: Diagnosis not present

## 2020-12-06 DIAGNOSIS — C786 Secondary malignant neoplasm of retroperitoneum and peritoneum: Secondary | ICD-10-CM | POA: Diagnosis not present

## 2020-12-06 DIAGNOSIS — Z800 Family history of malignant neoplasm of digestive organs: Secondary | ICD-10-CM | POA: Diagnosis not present

## 2020-12-06 DIAGNOSIS — K1230 Oral mucositis (ulcerative), unspecified: Secondary | ICD-10-CM | POA: Diagnosis not present

## 2020-12-06 DIAGNOSIS — C563 Malignant neoplasm of bilateral ovaries: Secondary | ICD-10-CM

## 2020-12-06 DIAGNOSIS — K5900 Constipation, unspecified: Secondary | ICD-10-CM | POA: Diagnosis not present

## 2020-12-06 DIAGNOSIS — G629 Polyneuropathy, unspecified: Secondary | ICD-10-CM | POA: Diagnosis not present

## 2020-12-06 DIAGNOSIS — R112 Nausea with vomiting, unspecified: Secondary | ICD-10-CM | POA: Diagnosis not present

## 2020-12-06 DIAGNOSIS — I700 Atherosclerosis of aorta: Secondary | ICD-10-CM | POA: Diagnosis not present

## 2020-12-06 DIAGNOSIS — G20 Parkinson's disease: Secondary | ICD-10-CM | POA: Diagnosis not present

## 2020-12-06 DIAGNOSIS — J449 Chronic obstructive pulmonary disease, unspecified: Secondary | ICD-10-CM | POA: Diagnosis not present

## 2020-12-06 DIAGNOSIS — R197 Diarrhea, unspecified: Secondary | ICD-10-CM | POA: Diagnosis not present

## 2020-12-06 DIAGNOSIS — C562 Malignant neoplasm of left ovary: Secondary | ICD-10-CM | POA: Diagnosis not present

## 2020-12-06 DIAGNOSIS — Z79899 Other long term (current) drug therapy: Secondary | ICD-10-CM | POA: Diagnosis not present

## 2020-12-06 DIAGNOSIS — Z808 Family history of malignant neoplasm of other organs or systems: Secondary | ICD-10-CM | POA: Diagnosis not present

## 2020-12-06 MED ORDER — SODIUM CHLORIDE 0.9% FLUSH
10.0000 mL | INTRAVENOUS | Status: DC | PRN
Start: 2020-12-06 — End: 2020-12-06
  Administered 2020-12-06: 10 mL

## 2020-12-06 MED ORDER — PALONOSETRON HCL INJECTION 0.25 MG/5ML
0.2500 mg | Freq: Once | INTRAVENOUS | Status: AC
  Administered 2020-12-06: 0.25 mg via INTRAVENOUS
  Filled 2020-12-06: qty 5

## 2020-12-06 MED ORDER — SODIUM CHLORIDE 0.9 % IV SOLN
10.0000 mg | Freq: Once | INTRAVENOUS | Status: AC
  Administered 2020-12-06: 10 mg via INTRAVENOUS
  Filled 2020-12-06: qty 10

## 2020-12-06 MED ORDER — FAMOTIDINE IN NACL 20-0.9 MG/50ML-% IV SOLN
20.0000 mg | Freq: Once | INTRAVENOUS | Status: AC
  Administered 2020-12-06: 20 mg via INTRAVENOUS
  Filled 2020-12-06: qty 50

## 2020-12-06 MED ORDER — SODIUM CHLORIDE 0.9 % IV SOLN
150.0000 mg | Freq: Once | INTRAVENOUS | Status: AC
  Administered 2020-12-06: 150 mg via INTRAVENOUS
  Filled 2020-12-06: qty 150

## 2020-12-06 MED ORDER — DIPHENHYDRAMINE HCL 50 MG/ML IJ SOLN
50.0000 mg | Freq: Once | INTRAMUSCULAR | Status: AC
  Administered 2020-12-06: 50 mg via INTRAVENOUS
  Filled 2020-12-06: qty 1

## 2020-12-06 MED ORDER — DEXTROSE 5 % IV SOLN: Freq: Once | INTRAVENOUS | Status: AC

## 2020-12-06 MED ORDER — ONDANSETRON HCL 4 MG PO TABS: 4.0000 mg | ORAL_TABLET | ORAL | 3 refills | Status: AC | PRN

## 2020-12-06 MED ORDER — SODIUM CHLORIDE 0.9 % IV SOLN
364.5000 mg | Freq: Once | INTRAVENOUS | Status: AC
  Administered 2020-12-06: 360 mg via INTRAVENOUS
  Filled 2020-12-06: qty 36

## 2020-12-06 MED ORDER — DOXORUBICIN HCL LIPOSOMAL CHEMO INJECTION 2 MG/ML
30.0000 mg/m2 | Freq: Once | INTRAVENOUS | Status: AC
  Administered 2020-12-06: 50 mg via INTRAVENOUS
  Filled 2020-12-06: qty 25

## 2020-12-06 MED ORDER — PROCHLORPERAZINE MALEATE 10 MG PO TABS: 10.0000 mg | ORAL_TABLET | Freq: Four times a day (QID) | ORAL | 3 refills | Status: DC | PRN

## 2020-12-06 MED ORDER — HEPARIN SOD (PORK) LOCK FLUSH 100 UNIT/ML IV SOLN
500.0000 [IU] | Freq: Once | INTRAVENOUS | Status: AC | PRN
  Administered 2020-12-06: 500 [IU]

## 2020-12-06 NOTE — Patient Instructions (Signed)
[facility]  Discharge Instructions: Thank you for choosing [facility] to provide your oncology and hematology care.  If you have a lab appointment with the [facility], please go directly to the [facility] and check in at the registration area.   Wear comfortable clothing and clothing appropriate for easy access to any Portacath or PICC line.   We strive to give you quality time with your provider. You may need to reschedule your appointment if you arrive late (15 or more minutes).  Arriving late affects you and other patients whose appointments are after yours.  Also, if you miss three or more appointments without notifying the office, you may be dismissed from the clinic at the provider's discretion.      For prescription refill requests, have your pharmacy contact our office and allow 72 hours for refills to be completed.    Today you received the following chemotherapy and/or immunotherapy agents:Liposomal Adriamycin and CarboplatinDoxorubicin Liposomal injection What is this medication? LIPOSOMAL DOXORUBICIN (LIP oh som al dox oh ROO bi sin) is a chemotherapy drug. This medicine is used to treat many kinds of cancer like Kaposi's sarcoma, multiple myeloma, and ovarian cancer. This medicine may be used for other purposes; ask your health care provider or pharmacist if you have questions. COMMON BRAND NAME(S): Doxil, Lipodox What should I tell my care team before I take this medication? They need to know if you have any of these conditions: blood disorders heart disease infection (especially a virus infection such as chickenpox, cold sores, or herpes) liver disease recent or ongoing radiation therapy an unusual or allergic reaction to doxorubicin, other chemotherapy agents, soybeans, other medicines, foods, dyes, or preservatives pregnant or trying to get pregnant breast-feeding How should I use this medication? This drug is given as an  infusion into a vein. It is administered in a hospital or clinic by a specially trained health care professional. If you have pain, swelling, burning or any unusual feeling around the site of your injection, tell your health care professional right away. Talk to your pediatrician regarding the use of this medicine in children. Special care may be needed. Overdosage: If you think you have taken too much of this medicine contact a poison control center or emergency room at once. NOTE: This medicine is only for you. Do not share this medicine with others. What if I miss a dose? It is important not to miss your dose. Call your doctor or health care professional if you are unable to keep an appointment. What may interact with this medication? Do not take this medicine with any of the following medications: zidovudine This medicine may also interact with the following medications: medicines to increase blood counts like filgrastim, pegfilgrastim, sargramostim vaccines Talk to your doctor or health care professional before taking any of these medicines: acetaminophen aspirin ibuprofen ketoprofen naproxen This list may not describe all possible interactions. Give your health care provider a list of all the medicines, herbs, non-prescription drugs, or dietary supplements you use. Also tell them if you smoke, drink alcohol, or use illegal drugs. Some items may interact with your medicine. What should I watch for while using this medication? Your condition will be monitored carefully while you are receiving this medicine. You may need blood work done while you are taking this medicine. This drug may make you feel generally unwell. This is not uncommon, as chemotherapy can affect healthy cells as well as cancer cells. Report any side  effects. Continue your course of treatment even though you feel ill unless your doctor tells you to stop. Your urine may turn orange-red for a few days after your dose. This  is not blood. If your urine is dark or brown, call your doctor. In some cases, you may be given additional medicines to help with side effects. Follow all directions for their use. Talk to your doctor about your risk of cancer. You may be more at risk for certain types of cancers if you take this medicine. Do not become pregnant while taking this medicine or for 6 months after stopping it. Women should inform their healthcare professional if they wish to become pregnant or think they may be pregnant. Men should not father a child while taking this medicine and for 6 months after stopping it. There is a potential for serious side effects to an unborn child. Talk to your health care professional or pharmacist for more information. Do not breast-feed an infant while taking this medicine. This medicine has caused ovarian failure in some women. This medicine may make it more difficult to get pregnant. Talk to your healthcare professional if you are concerned about your fertility. This medicine has caused decreased sperm counts in some men. This may make it more difficult to father a child. Talk to your healthcare professional if you are concerned about your fertility. This medicine may cause a decrease in Co-Enzyme Q-10. You should make sure that you get enough Co-Enzyme Q-10 while you are taking this medicine. Discuss the foods you eat and the vitamins you take with your health care professional. What side effects may I notice from receiving this medication? Side effects that you should report to your doctor or health care professional as soon as possible: allergic reactions like skin rash, itching or hives, swelling of the face, lips, or tongue low blood counts - this medicine may decrease the number of white blood cells, red blood cells and platelets. You may be at increased risk for infections and bleeding. signs of hand-foot syndrome - tingling or burning, redness, flaking, swelling, small blisters, or  small sores on the palms of your hands or the soles of your feet signs of infection - fever or chills, cough, sore throat, pain or difficulty passing urine signs of decreased platelets or bleeding - bruising, pinpoint red spots on the skin, black, tarry stools, blood in the urine signs of decreased red blood cells - unusually weak or tired, fainting spells, lightheadedness back pain, chills, facial flushing, fever, headache, tightness in the chest or throat during the infusion breathing problems chest pain fast, irregular heartbeat mouth pain, redness, sores pain, swelling, redness at site where injected pain, tingling, numbness in the hands or feet swelling of ankles, feet, or hands vomiting Side effects that usually do not require medical attention (report to your doctor or health care professional if they continue or are bothersome): diarrhea hair loss loss of appetite nail discoloration or damage nausea red or watery eyes red colored urine stomach upset This list may not describe all possible side effects. Call your doctor for medical advice about side effects. You may report side effects to FDA at 1-800-FDA-1088. Where should I keep my medication? This drug is given in a hospital or clinic and will not be stored at home. NOTE: This sheet is a summary. It may not cover all possible information. If you have questions about this medicine, talk to your doctor, pharmacist, or health care provider.  2022 Elsevier/Gold Standard (2017-10-18  15:13:26) Carboplatin injection What is this medication? CARBOPLATIN (KAR boe pla tin) is a chemotherapy drug. It targets fast dividing cells, like cancer cells, and causes these cells to die. This medicine is used to treat ovarian cancer and many other cancers. This medicine may be used for other purposes; ask your health care provider or pharmacist if you have questions. COMMON BRAND NAME(S): Paraplatin What should I tell my care team before I take  this medication? They need to know if you have any of these conditions: blood disorders hearing problems kidney disease recent or ongoing radiation therapy an unusual or allergic reaction to carboplatin, cisplatin, other chemotherapy, other medicines, foods, dyes, or preservatives pregnant or trying to get pregnant breast-feeding How should I use this medication? This drug is usually given as an infusion into a vein. It is administered in a hospital or clinic by a specially trained health care professional. Talk to your pediatrician regarding the use of this medicine in children. Special care may be needed. Overdosage: If you think you have taken too much of this medicine contact a poison control center or emergency room at once. NOTE: This medicine is only for you. Do not share this medicine with others. What if I miss a dose? It is important not to miss a dose. Call your doctor or health care professional if you are unable to keep an appointment. What may interact with this medication? medicines for seizures medicines to increase blood counts like filgrastim, pegfilgrastim, sargramostim some antibiotics like amikacin, gentamicin, neomycin, streptomycin, tobramycin vaccines Talk to your doctor or health care professional before taking any of these medicines: acetaminophen aspirin ibuprofen ketoprofen naproxen This list may not describe all possible interactions. Give your health care provider a list of all the medicines, herbs, non-prescription drugs, or dietary supplements you use. Also tell them if you smoke, drink alcohol, or use illegal drugs. Some items may interact with your medicine. What should I watch for while using this medication? Your condition will be monitored carefully while you are receiving this medicine. You will need important blood work done while you are taking this medicine. This drug may make you feel generally unwell. This is not uncommon, as chemotherapy can  affect healthy cells as well as cancer cells. Report any side effects. Continue your course of treatment even though you feel ill unless your doctor tells you to stop. In some cases, you may be given additional medicines to help with side effects. Follow all directions for their use. Call your doctor or health care professional for advice if you get a fever, chills or sore throat, or other symptoms of a cold or flu. Do not treat yourself. This drug decreases your body's ability to fight infections. Try to avoid being around people who are sick. This medicine may increase your risk to bruise or bleed. Call your doctor or health care professional if you notice any unusual bleeding. Be careful brushing and flossing your teeth or using a toothpick because you may get an infection or bleed more easily. If you have any dental work done, tell your dentist you are receiving this medicine. Avoid taking products that contain aspirin, acetaminophen, ibuprofen, naproxen, or ketoprofen unless instructed by your doctor. These medicines may hide a fever. Do not become pregnant while taking this medicine. Women should inform their doctor if they wish to become pregnant or think they might be pregnant. There is a potential for serious side effects to an unborn child. Talk to your health care professional  or pharmacist for more information. Do not breast-feed an infant while taking this medicine. What side effects may I notice from receiving this medication? Side effects that you should report to your doctor or health care professional as soon as possible: allergic reactions like skin rash, itching or hives, swelling of the face, lips, or tongue signs of infection - fever or chills, cough, sore throat, pain or difficulty passing urine signs of decreased platelets or bleeding - bruising, pinpoint red spots on the skin, black, tarry stools, nosebleeds signs of decreased red blood cells - unusually weak or tired, fainting  spells, lightheadedness breathing problems changes in hearing changes in vision chest pain high blood pressure low blood counts - This drug may decrease the number of white blood cells, red blood cells and platelets. You may be at increased risk for infections and bleeding. nausea and vomiting pain, swelling, redness or irritation at the injection site pain, tingling, numbness in the hands or feet problems with balance, talking, walking trouble passing urine or change in the amount of urine Side effects that usually do not require medical attention (report to your doctor or health care professional if they continue or are bothersome): hair loss loss of appetite metallic taste in the mouth or changes in taste This list may not describe all possible side effects. Call your doctor for medical advice about side effects. You may report side effects to FDA at 1-800-FDA-1088. Where should I keep my medication? This drug is given in a hospital or clinic and will not be stored at home. NOTE: This sheet is a summary. It may not cover all possible information. If you have questions about this medicine, talk to your doctor, pharmacist, or health care provider.  2022 Elsevier/Gold Standard (2007-05-17 14:38:05)     To help prevent nausea and vomiting after your treatment, we encourage you to take your nausea medication as directed.  BELOW ARE SYMPTOMS THAT SHOULD BE REPORTED IMMEDIATELY: *FEVER GREATER THAN 100.4 F (38 C) OR HIGHER *CHILLS OR SWEATING *NAUSEA AND VOMITING THAT IS NOT CONTROLLED WITH YOUR NAUSEA MEDICATION *UNUSUAL SHORTNESS OF BREATH *UNUSUAL BRUISING OR BLEEDING *URINARY PROBLEMS (pain or burning when urinating, or frequent urination) *BOWEL PROBLEMS (unusual diarrhea, constipation, pain near the anus) TENDERNESS IN MOUTH AND THROAT WITH OR WITHOUT PRESENCE OF ULCERS (sore throat, sores in mouth, or a toothache) UNUSUAL RASH, SWELLING OR PAIN  UNUSUAL VAGINAL DISCHARGE OR  ITCHING   Items with * indicate a potential emergency and should be followed up as soon as possible or go to the Emergency Department if any problems should occur.  Please show the CHEMOTHERAPY ALERT CARD or IMMUNOTHERAPY ALERT CARD at check-in to the Emergency Department and triage nurse.  Should you have questions after your visit or need to cancel or reschedule your appointment, please contact [facility]  Dept: [phone]  and follow the prompts.  Office hours are 8:00 a.m. to 4:30 p.m. Monday - Friday. Please note that voicemails left after 4:00 p.m. may not be returned until the following business day.  We are closed weekends and major holidays. You have access to a nurse at all times for urgent questions. Please call the main number to the clinic Dept: [phone] and follow the prompts.  For any non-urgent questions, you may also contact your provider using MyChart. We now offer e-Visits for anyone 65 and older to request care online for non-urgent symptoms. For details visit [email or web address].   Also download the MyChart app! Go  to the app store, search "MyChart", open the app, select [facility], and log in with your MyChart username and password.  Due to Covid, a mask is required upon entering the hospital/clinic. If you do not have a mask, one will be given to you upon arrival. For doctor visits, patients may have 1 support person aged 73 or older with them. For treatment visits, patients cannot have anyone with them due to current Covid guidelines and our immunocompromised population.

## 2020-12-06 NOTE — Progress Notes (Signed)
[name]  [phone] [phone]  Patient Care Team: Marco Collie, MD as PCP - General (Family Medicine) Berniece Salines, DO as PCP - Cardiology (Cardiology) Marice Potter, MD as Consulting Physician (Oncology)   Name of the patient: Megan Waller  [id number]  1940/04/28   Date of visit: 12/06/20  Diagnosis- Recurrent ovarian cancer  Chief complaint/Reason for visit- Initial Meeting for [facility], preparing for starting chemotherapy    Heme/Onc history:  Oncology History Overview Note  Patient presented in December with an abdominal nodule over her left medial abdomen that grew progressively over time.  She was seen by dermatology for this lesion and underwent a biopsy.  Ca-125: 1/13: 180 2/10: 96.9 3/16: 12.1 4/5: 9.7 12/9: 8.4   Ovarian cancer ([place])  02/14/2019 Initial Biopsy   Left lower abdominal skin biopsy, metastatic adenocarcinoma.  Based on histologic and IHC findings, a gynecologic primary is favored.   02/14/2019 Initial Diagnosis   Ovarian cancer ([place])   03/06/2019 Imaging   CT chest, abdomen, pelvis: Widespread peritoneal carcinomatosis with bilateral adnexal masses, bilateral external iliac adenopathy and a subcutaneous mass in the left periumbilical region, likely a soft tissue metastasis.  No evidence of thoracic metastatic disease.  Enlarging complex cystic and solid left thyroid nodule.  Large recurrent left anterior abdominal wall hernia containing portion of the transverse colon without evidence of incarceration or obstruction.  Cholelithiasis and aortic atherosclerosis.   03/08/2019 Cancer Staging   Staging form: Ovary, Fallopian Tube, and Primary Peritoneal Carcinoma, AJCC 8th Edition - Clinical stage from 03/08/2019: FIGO Stage IVB (cT1b, cN1, cM1b) - Signed by Marice Potter, MD on 10/19/2020 Histopathologic type: Adenocarcinoma, NOS   03/15/2019 -  Chemotherapy    Carboplatin/taxol Cycle 1: 1/20 Cycle 2: 2/23 (delayed secondary to side effects), 25% dose reduction Cycle 3: 3/16  Cycle 5: 4/29; continued Neulasta to prevent severe neutropenia from delaying cycles of tx.     03/17/2019 Imaging   CT chest: No pulmonary embolus noted.  Small pleural effusions with bibasilar atelectasis.  No adenopathy.  Dominant left lobe thyroid mass, stable from prior CT.   05/29/2019 Imaging   CT abdomen and pelvis: Interval reduction in size of bilateral ovarian masses, varies now measuring within normal limits (2 x 1.4 cm on the right and 1.7 x 1.4 cm on the left), essentially complete resolution of previously seen extensive metastatic omental and peritoneal nodularity about the abdomen and pelvis.  Resolution of bilateral inguinal lymphadenopathy. Parastomal hernia of the left lower quadrant containing nonobstructed loops of transverse colon and mid small bowel.   07/20/2019 Imaging   CT of the abdomen and pelvis shows stable 11 mm well-circumscribed left adrenal lesion in the body of the left adrenal.  Small cyst in the lower pole of the right kidney.  Signs of prior bowel resection of the small bowel and colon with anastomotic sites.  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  No adenopathy in the retroperitoneum or upper abdomen.  Right active adnexa measures 2 x 1.4 cm compared to 1.9 x 1.4 cm on last imaging.  Left adnexa measures 1.6 x 1.6 cm.  Uterus grossly normal on CT.  No pelvic adenopathy.  Ventral suprapubic hernia containing small bowel.  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  08/15/2019 Surgery   Robotic-assisted laparoscopic total hysterectomy with bilateral salpingoophorectomy, infracolic omentectomy Lysis of adhesions and hernia reduction (Dr. Rosendo Gros), repair of abdominal  wall hernia (Dr. Rosendo Gros)  On EUA, small mobile uterus. On intra-abdominal entry, normal appearing liver edge, diaphragm, and stomach. Omentum adherent to the anterior abdominal wall along patient's prior midline incision. Transverse colon hernia through prior ostomy site. Otherwise, some adhesions of the sigmoid to the left sidewall. Uterus 6cm and normal appearing. Right adnexa normal in appearance. Left adnexa with adhesions to the sigmoid colon and adherent to the medial aspect of the broad ligament. Left ovary with evidence of treatment effect. Several small (<75m) nodules on the peritoneum within the cul de sac c/w treated tumor implants (cauterized). Omentum without obvious evidence of tumor burden.   08/15/2019 Pathology Results   A. UTERUS, BILATERAL TUBES AND OVARIES, HYSTERECTOMY AND  BILATERALSALPINGO- OOPHORECTOMY:   Ovaries, bilateral:  -  Residual carcinoma status post neoadjuvant therapy involving  bilateral ovaries  -  See oncology table and comment below   Fallopian tubes, bilateral:  -  Benign fallopian tubes  -  No malignancy identified   Uterus:  -  Benign endometrial polyp  -  Leiomyoma (1.1 cm)  -  Serosa with focal calcifications  -  No hyperplasia or malignancy identified   Cervix:  -  Benign cervix with nabothian cysts  -  No dysplasia or malignancy identified   B. OMENTUM, RESECTION:  -  Microscopic focus of metastatic adenocarcinoma (< 192m   ONCOLOGY TABLE:   OVARY or FALLOPIAN TUBE or PRIMARY PERITONEUM:   Procedure: Hysterectomy, bilateral salpingo-oophorectomy and omentectomy  Specimen Integrity: Intact  Tumor Site: Ovary  Ovarian Surface Involvement: Present  Fallopian Tube Surface Involvement: Not identified  Tumor Size: 0.8 cm  Histologic Type: Carcinoma, subtype cannot be determined; See comment  Histologic Grade: High-grade; see comment  Implants: Not identified  Other Tissue/ Organ Involvement: Not identified  Largest Extrapelvic  Peritoneal Focus: < 1 mm  Peritoneal/Ascitic Fluid: Not applicable  Treatment Effect: Marked response with no or minimal residual cancer  (CRS 3)  Regional Lymph Nodes: No lymph nodes submitted or found  Pathologic Stage Classification (pTNM, AJCC 8th Edition): ypT3a, ypNX  Representative Tumor Block: A9  Comment(s): This case is being staged as if it were a high-grade serous  carcinoma.  Given that the morphologic and histologic assessment is  being rendered status post neoadjuvant therapy, the diagnostic features  of serous carcinoma are not readily apparent.  In fact, there are  scattered well-formed glandular elements within the foci of tumor. There  are other areas with high-grade nuclei and scattered psammomatous  calcifications.  Residual tumor is present in both the right and left  ovaries with surface involvement.  The tumor foci in the right ovary  measures greater than 2 mm in maximum size.  However, the focus of  metastatic carcinoma in the omentum shows only a single focus which  measures less than 1 mm.  Based on this assessment, CRS 3 is assigned to  this case.    10/25/2019 Genetic Testing   Negative genetic testing:  No pathogenic variants detected on the [facility] CancerNext + RNAinsight panel. The report date is 10/25/2019.   The CancerNext gene panel offered by [employer] sequencing and rearrangement analysis for the following 36 genes:   APC*, ATM*, AXIN2, BARD1, BMPR1A, BRCA1*, BRCA2*, BRIP1*, CDH1*, CDK4, CDKN2A, CHEK2*, DICER1, HOXB13, EPCAM, GREM1, MLH1*, MSH2*, MSH3, MSH6*, MUTYH*, NBN, NF1*, NTHL1, PALB2*, PMS2*,  POLD1, POLE, PTEN*, RAD51C*, RAD51D*, RECQL, SMAD4, SMARCA4, STK11, and TP53*. DNA and RNA analyses performed for * genes.   12/06/2020 -  Chemotherapy   Patient is on Treatment Plan : OVARIAN RECURRENT Liposomal Doxorubicin + Carboplatin q28d X 6 Cycles       Interval history-  Patient presents to chemo care clinic today for initial meeting in  preparation for starting chemotherapy. I introduced the chemo care clinic and we discussed that the role of the clinic is to assist those who are at an increased risk of emergency room visits and/or complications during the course of chemotherapy treatment. We discussed that the increased risk takes into account factors such as age, performance status, and co-morbidities. We also discussed that for some, this might include barriers to care such as not having a primary care provider, lack of insurance/transportation, or not being able to afford medications. We discussed that the goal of the program is to help prevent unplanned ER visits and help reduce complications during chemotherapy. We do this by discussing specific risk factors to each individual and identifying ways that we can help improve these risk factors and reduce barriers to care.   Allergies  Allergen Reactions   Ezetimibe-Simvastatin Other (See Comments)    Aching   Lipitor [Atorvastatin]     Achy    Past Medical History:  Diagnosis Date   Abdominal hernia    Acute on chronic respiratory failure with hypoxia ([place]) 03/25/2019   Anxiety    Anxiety    Atrial fibrillation ([place]) 03/24/2019   Chest pressure 09/18/2015   Community acquired bilateral lower lobe pneumonia 6/31/4970   Complication of anesthesia    COPD (chronic obstructive pulmonary disease) ([place]) 03/27/2019   Diverticulitis    Essential hypertension 09/18/2015   Family history of breast cancer    Family history of colon cancer    Family history of melanoma    Family history of prostate cancer    GAD (generalized anxiety disorder) 12/01/2015   Glaucoma    Hypercholesteremia    Hypertension    Hyperthyroidism    Hypokalemia 09/18/2015   Iron deficiency anemia, unspecified    Malignant neoplasm of ovary ([place])    Ovarian cancer ([place])    Parkinson disease ([place]) 12/01/2015   Primary insomnia 12/01/2015   Skin cancer    squamous cell   Weakness 09/18/2015    Past  Surgical History:  Procedure Laterality Date   BREAST LUMPECTOMY     INCISIONAL HERNIA REPAIR N/A 08/15/2019   Procedure: HERNIA REPAIR INCISIONAL WITH MESH;  Surgeon: [name], MD;  Location: [facility];  Service: General;  Laterality: N/A;   LAPAROTOMY N/A 08/15/2019   Procedure: EXPLORATORY LAPAROTOMY;  Surgeon: [name], MD;  Location: [facility];  Service: General;  Laterality: N/A;  REQUESTING RNFA   LYSIS OF ADHESION N/A 08/15/2019   Procedure: LYSIS OF ADHESION;  Surgeon: [name], MD;  Location: [facility];  Service: General;  Laterality: N/A;   PARTIAL COLECTOMY     had partial colectomy for diverticular disease, required an ostomy followed by stoma revision   right hand surgery     squamous cell skin cancer resections     3    Social History   Socioeconomic History   Marital status: Widowed    Spouse name: Not on file   Number of children: 3   Years of education: Not on file   Highest education level: Not on file  Occupational History   Not on file  Tobacco Use   Smoking status: Former   Smokeless tobacco: Never   Tobacco comments:    Quit 29 years ago  Vaping Use   Vaping Use: Never used  Substance and Sexual Activity   Alcohol use: Never   Drug use: Never   Sexual activity: Not on file  Other Topics Concern   Not on file  Social History Narrative   Not on file   Social Determinants of Health   Financial Resource Strain: Not on file  Food Insecurity: Not on file  Transportation Needs: Not on file  Physical Activity: Not on file  Stress: Not on file  Social Connections: Not on file  Intimate Partner Violence: Not on file    Family History  Problem Relation Age of Onset   Stroke Mother    Heart attack Father    Skin cancer Father        non-melanoma; dx. in his late 30s   Breast cancer Sister 51   Prostate cancer Brother 67       metastatic   Colon cancer Brother 34   Melanoma Brother    Breast cancer Sister 58   Breast cancer Niece  51   Colon cancer Paternal Aunt        dx. in her 55s or 73s   Cancer Paternal Uncle        mole on back?, dx. in his 15s   Breast cancer Other 24       paternal great-aunt (grandmother's sister)   Melanoma Brother 75   Colon cancer Paternal Aunt        dx. older than 30   Throat cancer Paternal Aunt        dx. in her 34s   Breast cancer Cousin        dx. in her 32s or 14s (maternal first cousin)   Breast cancer Cousin        dx. in her 34s or 66s (maternal first cousin)   Breast cancer Cousin        dx. in her 4s (maternal first cousin)     Current Outpatient Medications:    ondansetron (ZOFRAN) 4 MG tablet, Take 1 tablet (4 mg total) by mouth every 4 (four) hours as needed for nausea., Disp: 90 tablet, Rfl: 3   prochlorperazine (COMPAZINE) 10 MG tablet, Take 1 tablet (10 mg total) by mouth every 6 (six) hours as needed for nausea or vomiting., Disp: 90 tablet, Rfl: 3   aspirin 81 MG EC tablet, Take by mouth., Disp: , Rfl:    diltiazem (CARDIZEM CD) 120 MG 24 hr capsule, Take 120 mg by mouth in the morning and at bedtime. (Patient not taking: Reported on 11/18/2020), Disp: , Rfl:    diltiazem (CARDIZEM CD) 240 MG 24 hr capsule, Take 240 mg by mouth daily., Disp: , Rfl:    DULoxetine (CYMBALTA) 60 MG capsule, Take 60 mg by mouth daily., Disp: , Rfl:    furosemide (LASIX) 20 MG tablet, Take 20 mg by mouth every other day., Disp: , Rfl:    losartan (COZAAR) 100 MG tablet, Take 100 mg by mouth daily., Disp: , Rfl:    methimazole (TAPAZOLE) 5 MG tablet, Take 0.5 tablets (2.5 mg total) by mouth daily., Disp: 45 tablet, Rfl: 3   olaparib (LYNPARZA) 150 MG tablet, Take 300 mg by mouth in the am and 150 mg in the evening. Swallow whole. May take with food to decrease nausea and vomiting., Disp: , Rfl:  ondansetron (ZOFRAN) 4 MG tablet, Take 4 mg by mouth every 8 (eight) hours as needed for nausea or vomiting., Disp: , Rfl:    Travoprost, BAK Free, (TRAVATAN) 0.004 % SOLN ophthalmic  solution, Place 1 drop into both eyes at bedtime. , Disp: , Rfl:  No current facility-administered medications for this visit.  Facility-Administered Medications Ordered in Other Visits:    0.9 %  sodium chloride infusion (Manually program via Guardrails IV Fluids), 250 mL, Intravenous, Once, [place] Scrape A, NP   acetaminophen (TYLENOL) tablet 650 mg, 650 mg, Oral, Once, Ronne Binning, Hena Ewalt A, NP   diphenhydrAMINE (BENADRYL) capsule 25 mg, 25 mg, Oral, Once, Ronne Binning, Farren Nelles A, NP   heparin lock flush 100 unit/mL, 500 Units, Intracatheter, Daily PRN, [place] Scrape A, NP   heparin lock flush 100 unit/mL, 250 Units, Intracatheter, PRN, Ronne Binning, Clayborn Milnes A, NP   sodium chloride flush (NS) 0.9 % injection 10 mL, 10 mL, Intracatheter, PRN, Ronne Binning, Chenika Nevils A, NP   sodium chloride flush (NS) 0.9 % injection 3 mL, 3 mL, Intracatheter, PRN, [place] Scrape A, NP  CMP Latest Ref Rng & Units 10/10/2020  Glucose 70 - 99 mg/dL -  BUN 4 - 21 18  Creatinine 0.5 - 1.1 0.9  Sodium 137 - 147 139  Potassium 3.4 - 5.3 3.9  Chloride 99 - 108 103  CO2 13 - 22 30(A)  Calcium 8.7 - 10.7 8.7  Total Protein 6.5 - 8.1 g/dL -  Total Bilirubin 0.3 - 1.2 mg/dL -  Alkaline Phos 25 - 125 80  AST 13 - 35 21  ALT 7 - 35 11   CBC Latest Ref Rng & Units 10/10/2020  WBC - 4.9  Hemoglobin 12.0 - 16.0 11.4(A)  Hematocrit 36 - 46 34(A)  Platelets 150 - 399 139(A)    No images are attached to the encounter.  No results found.   Assessment and plan- Patient is a 80 y.o. female who presents to [facility] for initial meeting in preparation for starting chemotherapy for the treatment of ovarian cancer.   Chemo Care Clinic/High Risk for ER/Hospitalization during chemotherapy- We discussed the role of the chemo care clinic and identified patient specific risk factors. I discussed that patient was identified as high risk primarily based on:  Patient has past medical history positive for: Past Medical History:   Diagnosis Date   Abdominal hernia    Acute on chronic respiratory failure with hypoxia ([place]) 03/25/2019   Anxiety    Anxiety    Atrial fibrillation ([place]) 03/24/2019   Chest pressure 09/18/2015   Community acquired bilateral lower lobe pneumonia 03/31/154   Complication of anesthesia    COPD (chronic obstructive pulmonary disease) ([place]) 03/27/2019   Diverticulitis    Essential hypertension 09/18/2015   Family history of breast cancer    Family history of colon cancer    Family history of melanoma    Family history of prostate cancer    GAD (generalized anxiety disorder) 12/01/2015   Glaucoma    Hypercholesteremia    Hypertension    Hyperthyroidism    Hypokalemia 09/18/2015   Iron deficiency anemia, unspecified    Malignant neoplasm of ovary ([place])    Ovarian cancer ([place])    Parkinson disease ([place]) 12/01/2015   Primary insomnia 12/01/2015   Skin cancer    squamous cell   Weakness 09/18/2015    Patient has past surgical history positive for: Past Surgical History:  Procedure Laterality Date   BREAST LUMPECTOMY  INCISIONAL HERNIA REPAIR N/A 08/15/2019   Procedure: HERNIA REPAIR INCISIONAL WITH MESH;  Surgeon: [name], MD;  [facility];  Service: General;  Laterality: N/A;   LAPAROTOMY N/A 08/15/2019   Procedure: EXPLORATORY LAPAROTOMY;  Surgeon: [name], MD;  [facility];  Service: General;  Laterality: N/A;  REQUESTING RNFA   LYSIS OF ADHESION N/A 08/15/2019   Procedure: LYSIS OF ADHESION;  Surgeon: [name], MD;  [facility];  Service: General;  Laterality: N/A;   PARTIAL COLECTOMY     had partial colectomy for diverticular disease, required an ostomy followed by stoma revision   right hand surgery     squamous cell skin cancer resections     3   The patient is a with recurrent ovarian cancer.  Patient presents to clinic today for chemotherapy education and palliative care consult.  We will start doxil/ carboplatin.  We will send in  prescriptions for prochlorperazine and ondansetron.  The patient verbalizes understanding of and agreement to the plan as discussed today.  Provided general information including the following: 1.  Date of education: 12/06/2020 2.  Physician name: Dr. Bobby Rumpf 3.  Diagnosis: Recurrent ovarian cancer 4.  Stage: Recurrent 5.  Control 6.  Chemotherapy plan including drugs and how often: Doxil/ Carboplatin IV every 4 weeks x 6 cycles 7.  Start date: 12/06/2020 8.  Other referrals: None at this time 9.  The patient is to call our office with any questions or concerns.  Our office number [phone], if after hours or on the weekend, call the same number and wait for the answering service.  There is always an oncologist on call 10.  Medications prescribed: Ondansetron, Prochlorperazine 11.  The patient has verbalized understanding of the treatment plan and has no barriers to adherence or understanding.  Obtained signed consent from patient.  Discussed symptoms including 1.  Low blood counts including red blood cells, white blood cells and platelets. 2. Infection including to avoid large crowds, wash hands frequently, and stay away from people who were sick.  If fever develops of 100.4 or higher, call our office. 3.  Mucositis-given instructions on mouth rinse (baking soda and salt mixture).  Keep mouth clean.  Use soft bristle toothbrush.  If mouth sores develop, call our clinic. 4.  Nausea/vomiting-gave prescriptions for ondansetron 4 mg every 4 hours as needed for nausea, may take around the clock if persistent.  Compazine 10 mg every 6 hours, may take around the clock if persistent. 5.  Diarrhea-use over-the-counter Imodium.  Call clinic if not controlled. 6.  Constipation-use senna, 1 to 2 tablets twice a day.  If no BM in 2 to 3 days call the clinic. 7.  Loss of appetite-try to eat small meals every 2-3 hours.  Call clinic if not eating. 8.  Taste changes-zinc 500 mg daily.  If becomes severe  call clinic. 9.  Alcoholic beverages. 10.  Drink 2 to 3 quarts of water per day. 11.  Peripheral neuropathy-patient to call if numbness or tingling in hands or feet is persistent  Answered questions to patient satisfaction.  Patient is to call with any further questions or concerns.  The medication prescribed to the patient will be printed out from Epic references. This will give the following information: Name of your medication Approved uses Dose and schedule Storage and handling Handling body fluids and waste Drug and food interactions Possible side effects and management Pregnancy, sexual activity, and contraception Obtaining medication   We discussed that  social determinants of health may have significant impacts on health and outcomes for cancer patients.  Today we discussed specific social determinants of performance status, alcohol use, depression, financial needs, food insecurity, housing, interpersonal violence, social connections, stress, tobacco use, and transportation.    After lengthy discussion the following were identified as areas of need:   Outpatient services: We discussed options including home based and outpatient services, DME and care program. We discusssed that patients who participate in regular physical activity report fewer negative impacts of cancer and treatments and report less fatigue.   Financial Concerns: We discussed that living with cancer can create tremendous financial burden.  We discussed options for assistance. I asked that if assistance is needed in affording medications or paying bills to please let us know so that we can provide assistance. We discussed options for food including social services, [employer] ($50 every 2 weeks) and onsite food pantry.  We will also notify [facility] to see if cancer center can provide additional support.  Referral to Social work: Introduced Education officer, museum Elease Etienne and the services he can provide  such as support with utility bill, cell phone and gas vouchers.   Support groups: We discussed options for support groups at the cancer center. If interested, please notify nurse navigator to enroll. We discussed options for managing stress including healthy eating, exercise as well as participating in no charge counseling services at the cancer center and support groups.  If these are of interest, patient can notify either myself or primary nursing team.We discussed options for management including medications and referral to quit Smart program  Transportation: We discussed options for transportation including [employer], paratransit, bus routes, link transit, taxi/uber/lyft, and cancer center van.  I have notified primary oncology team who will help assist with arranging Lucianne Lei transportation for appointments when/if needed. We also discussed options for transportation on short notice/acute visits.  Palliative care services: We have palliative care services available in the cancer center to discuss goals of care and advanced care planning.  Please let us know if you have any questions or would like to speak to our palliative nurse practitioner.  Symptom Management Clinic: We discussed our symptom management clinic which is available for acute concerns while receiving treatment such as nausea, vomiting or diarrhea.  We can be reached via telephone at [phone] or through my chart.  We are available for virtual or in person visits on the same day from 830 to 4 PM Monday through Friday. She denies needing specific assistance at this time and She will be followed by Dr. Bobby Rumpf clinical team.  Plan: Discussed symptom management clinic. Discussed palliative care services. Discussed resources that are available here at the cancer center. Discussed medications and new prescriptions to begin treatment such as anti-nausea or steroids.   Disposition: RTC on   Visit Diagnosis No diagnosis found.  Patient expressed  understanding and was in agreement with this plan. She also understands that She can call clinic at any time with any questions, concerns, or complaints.   I provided 30 minutes of  face to face  during this encounter, and > 50% was spent counseling as documented under my assessment & plan.   Altha Harm, PhD, NP-[facility] [place] [facility] [phone]

## 2020-12-06 NOTE — Progress Notes (Signed)
1545- AVS reviewed with patient and daughter. Discharged home, stable

## 2020-12-06 NOTE — Telephone Encounter (Addendum)
Pt in to see Melissa,NP, for chemo education then infusion. All concerns below were discussed with pt and daughter.   Ulice Dash, pharmacist - no dose reductions planned on 1st cycle.  She has had one of the drugs before.  Do you know why she was hospitalized in the past?  Febrile neutropenia, infection?  Edlin Ford,RN - Pt's dtr called and is very nervous about chemo tx. Pt has been admitted within 15 days of infusion TWICE. She wants to know if Dr Bobby Rumpf is starting her out at a reduced dose? They of course want to avoid hospitalization

## 2020-12-09 ENCOUNTER — Telehealth: Payer: Self-pay

## 2020-12-09 NOTE — Telephone Encounter (Signed)
I spoke with Mrs Megan Waller. She states she is doing fine today. She didn't have any problems with N/V, fevers, mouth sores, or skin reactions over the weekend. She admits to some diarrhea, but it just ran its course and then stopped. She didn't have to take Imodium. We discussed probable hair loss, the tingling sensation that will occur, the use of silk/satin pillowcases, & baby shampoo as needed. Pt has had hair loss in past treatments. I reminded pt of the importance of calling us with temp of 100.4 or higher, day or night. She verbalized understanding.

## 2020-12-11 DIAGNOSIS — Z6825 Body mass index (BMI) 25.0-25.9, adult: Secondary | ICD-10-CM | POA: Diagnosis not present

## 2020-12-11 DIAGNOSIS — I10 Essential (primary) hypertension: Secondary | ICD-10-CM | POA: Diagnosis not present

## 2020-12-11 DIAGNOSIS — F339 Major depressive disorder, recurrent, unspecified: Secondary | ICD-10-CM | POA: Diagnosis not present

## 2020-12-11 DIAGNOSIS — C569 Malignant neoplasm of unspecified ovary: Secondary | ICD-10-CM | POA: Diagnosis not present

## 2020-12-16 ENCOUNTER — Encounter: Payer: Self-pay | Admitting: Oncology

## 2020-12-23 DIAGNOSIS — I10 Essential (primary) hypertension: Secondary | ICD-10-CM | POA: Diagnosis not present

## 2020-12-24 DIAGNOSIS — E785 Hyperlipidemia, unspecified: Secondary | ICD-10-CM | POA: Diagnosis not present

## 2020-12-24 DIAGNOSIS — F329 Major depressive disorder, single episode, unspecified: Secondary | ICD-10-CM | POA: Diagnosis not present

## 2020-12-24 DIAGNOSIS — I10 Essential (primary) hypertension: Secondary | ICD-10-CM | POA: Diagnosis not present

## 2020-12-26 NOTE — Progress Notes (Signed)
[facility]  [address] [place],  [place]  [address] [phone]  Clinic Day:  01/02/2021  Referring physician: Marco Collie, MD  This document serves as a record of services personally performed by Sharlie Shreffler Macarthur Critchley, MD. It was created on their behalf by [facility], a trained medical scribe. The creation of this record is based on the scribe's personal observations and the provider's statements to them.  HISTORY OF PRESENT ILLNESS:  The patient is a 80 y.o. female with recurrent/metastatic ovarian cancer. She comes in today to be evaluated before heading into her 2nd cycle of carboplatin/Doxil.  Other than mild erythrodysesthesia in her fingers, she tolerated her 1st cycle of chemotherapy very well.  She denies having any abdominal pain or other symptoms which concern her for overt signs of disease recurrence.    With respect to her ovarian cancer, she initially presented with peritoneal disease and a metastatic nodule over her left abdominal wall.  She underwent 6 cycles of carboplatin/paclitaxel before undergoing a TAHBSO/maximum debulking surgery in August 2021.  The patient took olaparib maintenance therapy for numerous months until scans and a biopsy proved evidence of disease recurrence.   PHYSICAL EXAM:  Blood pressure (!) 202/82, pulse 72, temperature 98.1 F (36.7 C), resp. rate 14, height 5\' 2"  (1.575 m), weight 142 lb 14.4 oz (64.8 kg), SpO2 94 %. Wt Readings from Last 3 Encounters:  01/02/21 142 lb 14.4 oz (64.8 kg)  12/06/20 146 lb (66.2 kg)  12/02/20 144 lb 4.8 oz (65.5 kg)   Body mass index is 26.14 kg/m. Performance status (ECOG): 1 Physical Exam Constitutional:      General: She is not in acute distress.    Appearance: Normal appearance. She is normal weight.  HENT:     Head: Normocephalic and atraumatic.  Eyes:     General: No scleral icterus.    Extraocular Movements: Extraocular movements intact.      Conjunctiva/sclera: Conjunctivae normal.     Pupils: Pupils are equal, round, and reactive to light.  Cardiovascular:     Rate and Rhythm: Normal rate and regular rhythm.     Pulses: Normal pulses.     Heart sounds: Normal heart sounds. No murmur heard.   No friction rub. No gallop.  Pulmonary:     Effort: Pulmonary effort is normal. No respiratory distress.     Breath sounds: Normal breath sounds.  Abdominal:     General: Bowel sounds are normal. There is no distension.     Palpations: Abdomen is soft. There is no hepatomegaly, splenomegaly or mass.     Tenderness: There is no abdominal tenderness.  Musculoskeletal:        General: Normal range of motion.     Cervical back: Normal range of motion and neck supple.     Right lower leg: No edema.     Left lower leg: No edema.  Lymphadenopathy:     Cervical: No cervical adenopathy.  Skin:    General: Skin is warm and dry.     Comments: Previous abdominal skin nodule has dissipated   Neurological:     General: No focal deficit present.     Mental Status: She is alert and oriented to person, place, and time. Mental status is at baseline.  Psychiatric:        Mood and Affect: Mood normal.        Behavior: Behavior normal.        Thought Content: Thought content normal.  Judgment: Judgment normal.    ASSESSMENT & PLAN:  Assessment/Plan:  A 80 y.o. female with recurrent/metastatic ovarian cancer.  She will proceed with her 2nd cycle of carboplatin/Doxil tomorrow.  Carboplatin is being given at an AUC of 5, while Doxil is being given at 30 mg/m2.  Per physical exam, it already appears she is having a positive response to her chemotherapy as her previous abdominal skin nodule has dissipated.  Clinically, she is doing well.  I will see her back in 4 weeks before she heads into her 3rd cycle of carboplatin/Doxil.  The patient understands all the plans discussed today and is in agreement with them.    I, Rita Ohara, am acting as  scribe for Marice Potter, MD    I have reviewed this report as typed by the medical scribe, and it is complete and accurate.  Zareah Hunzeker Macarthur Critchley, MD

## 2021-01-02 ENCOUNTER — Inpatient Hospital Stay: Payer: Medicare Other | Attending: Oncology

## 2021-01-02 ENCOUNTER — Encounter: Payer: Self-pay | Admitting: Oncology

## 2021-01-02 ENCOUNTER — Inpatient Hospital Stay (INDEPENDENT_AMBULATORY_CARE_PROVIDER_SITE_OTHER): Payer: Medicare Other | Admitting: Oncology

## 2021-01-02 VITALS — BP 202/82 | HR 72 | Temp 98.1°F | Resp 14 | Ht 62.0 in | Wt 142.9 lb

## 2021-01-02 DIAGNOSIS — C569 Malignant neoplasm of unspecified ovary: Secondary | ICD-10-CM | POA: Insufficient documentation

## 2021-01-02 DIAGNOSIS — C562 Malignant neoplasm of left ovary: Secondary | ICD-10-CM

## 2021-01-02 DIAGNOSIS — C4442 Squamous cell carcinoma of skin of scalp and neck: Secondary | ICD-10-CM | POA: Diagnosis not present

## 2021-01-02 DIAGNOSIS — Z90710 Acquired absence of both cervix and uterus: Secondary | ICD-10-CM | POA: Insufficient documentation

## 2021-01-02 DIAGNOSIS — C563 Malignant neoplasm of bilateral ovaries: Secondary | ICD-10-CM

## 2021-01-02 DIAGNOSIS — C786 Secondary malignant neoplasm of retroperitoneum and peritoneum: Secondary | ICD-10-CM | POA: Diagnosis not present

## 2021-01-02 DIAGNOSIS — Z5111 Encounter for antineoplastic chemotherapy: Secondary | ICD-10-CM | POA: Diagnosis not present

## 2021-01-02 DIAGNOSIS — L570 Actinic keratosis: Secondary | ICD-10-CM | POA: Diagnosis not present

## 2021-01-02 DIAGNOSIS — L578 Other skin changes due to chronic exposure to nonionizing radiation: Secondary | ICD-10-CM | POA: Diagnosis not present

## 2021-01-02 DIAGNOSIS — Z90722 Acquired absence of ovaries, bilateral: Secondary | ICD-10-CM | POA: Diagnosis not present

## 2021-01-02 LAB — CBC AND DIFFERENTIAL
HCT: 34 — AB (ref 36–46)
Hemoglobin: 11.9 — AB (ref 12.0–16.0)
Neutrophils Absolute: 1.63
Platelets: 235 (ref 150–399)
WBC: 3.8

## 2021-01-02 LAB — CBC: RBC: 3.15 — AB (ref 3.87–5.11)

## 2021-01-02 LAB — BASIC METABOLIC PANEL
BUN: 18 (ref 4–21)
CO2: 30 — AB (ref 13–22)
Chloride: 104 (ref 99–108)
Creatinine: 0.7 (ref 0.5–1.1)
Glucose: 106
Potassium: 3.5 (ref 3.4–5.3)
Sodium: 142 (ref 137–147)

## 2021-01-02 LAB — HEPATIC FUNCTION PANEL
ALT: 16 (ref 7–35)
AST: 26 (ref 13–35)
Alkaline Phosphatase: 92 (ref 25–125)
Bilirubin, Total: 0.5

## 2021-01-02 LAB — COMPREHENSIVE METABOLIC PANEL
Albumin: 4.1 (ref 3.5–5.0)
Calcium: 8.9 (ref 8.7–10.7)

## 2021-01-02 MED FILL — Carboplatin IV Soln 450 MG/45ML: INTRAVENOUS | Qty: 36 | Status: AC

## 2021-01-02 MED FILL — Doxorubicin HCl Liposomal Inj (For IV Infusion) 2 MG/ML: INTRAVENOUS | Qty: 25 | Status: AC

## 2021-01-02 MED FILL — Fosaprepitant Dimeglumine For IV Infusion 150 MG (Base Eq): INTRAVENOUS | Qty: 5 | Status: AC

## 2021-01-03 ENCOUNTER — Other Ambulatory Visit: Payer: Self-pay

## 2021-01-03 ENCOUNTER — Encounter: Payer: Self-pay | Admitting: Oncology

## 2021-01-03 ENCOUNTER — Inpatient Hospital Stay: Payer: Medicare Other

## 2021-01-03 VITALS — BP 174/63 | HR 85 | Temp 98.6°F | Resp 18

## 2021-01-03 DIAGNOSIS — C563 Malignant neoplasm of bilateral ovaries: Secondary | ICD-10-CM

## 2021-01-03 DIAGNOSIS — Z5111 Encounter for antineoplastic chemotherapy: Secondary | ICD-10-CM | POA: Diagnosis not present

## 2021-01-03 DIAGNOSIS — Z90710 Acquired absence of both cervix and uterus: Secondary | ICD-10-CM | POA: Diagnosis not present

## 2021-01-03 DIAGNOSIS — C569 Malignant neoplasm of unspecified ovary: Secondary | ICD-10-CM | POA: Diagnosis not present

## 2021-01-03 DIAGNOSIS — C786 Secondary malignant neoplasm of retroperitoneum and peritoneum: Secondary | ICD-10-CM | POA: Diagnosis not present

## 2021-01-03 DIAGNOSIS — Z90722 Acquired absence of ovaries, bilateral: Secondary | ICD-10-CM | POA: Diagnosis not present

## 2021-01-03 MED ORDER — ALTEPLASE 2 MG IJ SOLR
2.0000 mg | Freq: Once | INTRAMUSCULAR | Status: AC | PRN
  Administered 2021-01-03: 2 mg
  Filled 2021-01-03: qty 2

## 2021-01-03 MED ORDER — PALONOSETRON HCL INJECTION 0.25 MG/5ML
0.2500 mg | Freq: Once | INTRAVENOUS | Status: AC
  Administered 2021-01-03: 0.25 mg via INTRAVENOUS
  Filled 2021-01-03: qty 5

## 2021-01-03 MED ORDER — SODIUM CHLORIDE 0.9 % IV SOLN
10.0000 mg | Freq: Once | INTRAVENOUS | Status: AC
  Administered 2021-01-03: 10 mg via INTRAVENOUS
  Filled 2021-01-03: qty 1

## 2021-01-03 MED ORDER — DEXTROSE 5 % IV SOLN: Freq: Once | INTRAVENOUS | Status: AC

## 2021-01-03 MED ORDER — DIPHENHYDRAMINE HCL 50 MG/ML IJ SOLN
50.0000 mg | Freq: Once | INTRAMUSCULAR | Status: AC
  Administered 2021-01-03: 50 mg via INTRAVENOUS
  Filled 2021-01-03: qty 1

## 2021-01-03 MED ORDER — FAMOTIDINE 20 MG IN NS 100 ML IVPB
20.0000 mg | Freq: Once | INTRAVENOUS | Status: AC
  Administered 2021-01-03: 20 mg via INTRAVENOUS
  Filled 2021-01-03: qty 100

## 2021-01-03 MED ORDER — SODIUM CHLORIDE 0.9 % IV SOLN
150.0000 mg | Freq: Once | INTRAVENOUS | Status: AC
  Administered 2021-01-03: 150 mg via INTRAVENOUS
  Filled 2021-01-03: qty 150

## 2021-01-03 MED ORDER — SODIUM CHLORIDE 0.9% FLUSH
10.0000 mL | INTRAVENOUS | Status: DC | PRN
  Administered 2021-01-03 (×2): 10 mL

## 2021-01-03 MED ORDER — HEPARIN SOD (PORK) LOCK FLUSH 100 UNIT/ML IV SOLN
500.0000 [IU] | Freq: Once | INTRAVENOUS | Status: AC | PRN
  Administered 2021-01-03: 500 [IU]

## 2021-01-03 MED ORDER — DOXORUBICIN HCL LIPOSOMAL CHEMO INJECTION 2 MG/ML
30.0000 mg/m2 | Freq: Once | INTRAVENOUS | Status: AC
  Administered 2021-01-03: 50 mg via INTRAVENOUS
  Filled 2021-01-03: qty 25

## 2021-01-03 MED ORDER — SODIUM CHLORIDE 0.9 % IV SOLN
364.5000 mg | Freq: Once | INTRAVENOUS | Status: AC
  Administered 2021-01-03: 360 mg via INTRAVENOUS
  Filled 2021-01-03: qty 36

## 2021-01-03 NOTE — Patient Instructions (Signed)
[facility]  Discharge Instructions: Thank you for choosing [facility] to provide your oncology and hematology care.  If you have a lab appointment with the [facility], please go directly to the [facility] and check in at the registration area.   Wear comfortable clothing and clothing appropriate for easy access to any Portacath or PICC line.   We strive to give you quality time with your provider. You may need to reschedule your appointment if you arrive late (15 or more minutes).  Arriving late affects you and other patients whose appointments are after yours.  Also, if you miss three or more appointments without notifying the office, you may be dismissed from the clinic at the provider's discretion.      For prescription refill requests, have your pharmacy contact our office and allow 72 hours for refills to be completed.    Today you received the following chemotherapy and/or immunotherapy agents doxil/carbo   To help prevent nausea and vomiting after your treatment, we encourage you to take your nausea medication as directed.  BELOW ARE SYMPTOMS THAT SHOULD BE REPORTED IMMEDIATELY: *FEVER GREATER THAN 100.4 F (38 C) OR HIGHER *CHILLS OR SWEATING *NAUSEA AND VOMITING THAT IS NOT CONTROLLED WITH YOUR NAUSEA MEDICATION *UNUSUAL SHORTNESS OF BREATH *UNUSUAL BRUISING OR BLEEDING *URINARY PROBLEMS (pain or burning when urinating, or frequent urination) *BOWEL PROBLEMS (unusual diarrhea, constipation, pain near the anus) TENDERNESS IN MOUTH AND THROAT WITH OR WITHOUT PRESENCE OF ULCERS (sore throat, sores in mouth, or a toothache) UNUSUAL RASH, SWELLING OR PAIN  UNUSUAL VAGINAL DISCHARGE OR ITCHING   Items with * indicate a potential emergency and should be followed up as soon as possible or go to the Emergency Department if any problems should occur.  Please show the CHEMOTHERAPY ALERT CARD or IMMUNOTHERAPY ALERT CARD at check-in to the  Emergency Department and triage nurse.  Should you have questions after your visit or need to cancel or reschedule your appointment, please contact [facility]  Dept: [phone]  and follow the prompts.  Office hours are 8:00 a.m. to 4:30 p.m. Monday - Friday. Please note that voicemails left after 4:00 p.m. may not be returned until the following business day.  We are closed weekends and major holidays. You have access to a nurse at all times for urgent questions. Please call the main number to the clinic Dept: [phone] and follow the prompts.  For any non-urgent questions, you may also contact your provider using MyChart. We now offer e-Visits for anyone 48 and older to request care online for non-urgent symptoms. For details visit [email or web address].   Also download the MyChart app! Go to the app store, search "MyChart", open the app, select [facility], and log in with your MyChart username and password.  Due to Covid, a mask is required upon entering the hospital/clinic. If you do not have a mask, one will be given to you upon arrival. For doctor visits, patients may have 1 support person aged 58 or older with them. For treatment visits, patients cannot have anyone with them due to current Covid guidelines and our immunocompromised population.   Carboplatin injection What is this medication? CARBOPLATIN (KAR boe pla tin) is a chemotherapy drug. It targets fast dividing cells, like cancer cells, and causes these cells to die. This medicine is used to treat ovarian cancer and many other cancers. This medicine may be used for other purposes; ask your health care provider  or pharmacist if you have questions. COMMON BRAND NAME(S): Paraplatin What should I tell my care team before I take this medication? They need to know if you have any of these conditions: blood disorders hearing problems kidney disease recent or ongoing radiation therapy an unusual or  allergic reaction to carboplatin, cisplatin, other chemotherapy, other medicines, foods, dyes, or preservatives pregnant or trying to get pregnant breast-feeding How should I use this medication? This drug is usually given as an infusion into a vein. It is administered in a hospital or clinic by a specially trained health care professional. Talk to your pediatrician regarding the use of this medicine in children. Special care may be needed. Overdosage: If you think you have taken too much of this medicine contact a poison control center or emergency room at once. NOTE: This medicine is only for you. Do not share this medicine with others. What if I miss a dose? It is important not to miss a dose. Call your doctor or health care professional if you are unable to keep an appointment. What may interact with this medication? medicines for seizures medicines to increase blood counts like filgrastim, pegfilgrastim, sargramostim some antibiotics like amikacin, gentamicin, neomycin, streptomycin, tobramycin vaccines Talk to your doctor or health care professional before taking any of these medicines: acetaminophen aspirin ibuprofen ketoprofen naproxen This list may not describe all possible interactions. Give your health care provider a list of all the medicines, herbs, non-prescription drugs, or dietary supplements you use. Also tell them if you smoke, drink alcohol, or use illegal drugs. Some items may interact with your medicine. What should I watch for while using this medication? Your condition will be monitored carefully while you are receiving this medicine. You will need important blood work done while you are taking this medicine. This drug may make you feel generally unwell. This is not uncommon, as chemotherapy can affect healthy cells as well as cancer cells. Report any side effects. Continue your course of treatment even though you feel ill unless your doctor tells you to stop. In some  cases, you may be given additional medicines to help with side effects. Follow all directions for their use. Call your doctor or health care professional for advice if you get a fever, chills or sore throat, or other symptoms of a cold or flu. Do not treat yourself. This drug decreases your body's ability to fight infections. Try to avoid being around people who are sick. This medicine may increase your risk to bruise or bleed. Call your doctor or health care professional if you notice any unusual bleeding. Be careful brushing and flossing your teeth or using a toothpick because you may get an infection or bleed more easily. If you have any dental work done, tell your dentist you are receiving this medicine. Avoid taking products that contain aspirin, acetaminophen, ibuprofen, naproxen, or ketoprofen unless instructed by your doctor. These medicines may hide a fever. Do not become pregnant while taking this medicine. Women should inform their doctor if they wish to become pregnant or think they might be pregnant. There is a potential for serious side effects to an unborn child. Talk to your health care professional or pharmacist for more information. Do not breast-feed an infant while taking this medicine. What side effects may I notice from receiving this medication? Side effects that you should report to your doctor or health care professional as soon as possible: allergic reactions like skin rash, itching or hives, swelling of the face, lips, or  tongue signs of infection - fever or chills, cough, sore throat, pain or difficulty passing urine signs of decreased platelets or bleeding - bruising, pinpoint red spots on the skin, black, tarry stools, nosebleeds signs of decreased red blood cells - unusually weak or tired, fainting spells, lightheadedness breathing problems changes in hearing changes in vision chest pain high blood pressure low blood counts - This drug may decrease the number of white  blood cells, red blood cells and platelets. You may be at increased risk for infections and bleeding. nausea and vomiting pain, swelling, redness or irritation at the injection site pain, tingling, numbness in the hands or feet problems with balance, talking, walking trouble passing urine or change in the amount of urine Side effects that usually do not require medical attention (report to your doctor or health care professional if they continue or are bothersome): hair loss loss of appetite metallic taste in the mouth or changes in taste This list may not describe all possible side effects. Call your doctor for medical advice about side effects. You may report side effects to FDA at 1-800-FDA-1088. Where should I keep my medication? This drug is given in a hospital or clinic and will not be stored at home. NOTE: This sheet is a summary. It may not cover all possible information. If you have questions about this medicine, talk to your doctor, pharmacist, or health care provider.  2022 Elsevier/Gold Standard (2007-07-20 00:00:00) Doxorubicin Liposomal injection What is this medication? LIPOSOMAL DOXORUBICIN (LIP oh som al  dox oh ROO bi sin) is a chemotherapy drug. This medicine is used to treat many kinds of cancer like Kaposi's sarcoma, multiple myeloma, and ovarian cancer. This medicine may be used for other purposes; ask your health care provider or pharmacist if you have questions. COMMON BRAND NAME(S): Doxil, Lipodox What should I tell my care team before I take this medication? They need to know if you have any of these conditions: blood disorders heart disease infection (especially a virus infection such as chickenpox, cold sores, or herpes) liver disease recent or ongoing radiation therapy an unusual or allergic reaction to doxorubicin, other chemotherapy agents, soybeans, other medicines, foods, dyes, or preservatives pregnant or trying to get pregnant breast-feeding How  should I use this medication? This drug is given as an infusion into a vein. It is administered in a hospital or clinic by a specially trained health care professional. If you have pain, swelling, burning or any unusual feeling around the site of your injection, tell your health care professional right away. Talk to your pediatrician regarding the use of this medicine in children. Special care may be needed. Overdosage: If you think you have taken too much of this medicine contact a poison control center or emergency room at once. NOTE: This medicine is only for you. Do not share this medicine with others. What if I miss a dose? It is important not to miss your dose. Call your doctor or health care professional if you are unable to keep an appointment. What may interact with this medication? Do not take this medicine with any of the following medications: zidovudine This medicine may also interact with the following medications: medicines to increase blood counts like filgrastim, pegfilgrastim, sargramostim vaccines Talk to your doctor or health care professional before taking any of these medicines: acetaminophen aspirin ibuprofen ketoprofen naproxen This list may not describe all possible interactions. Give your health care provider a list of all the medicines, herbs, non-prescription drugs, or dietary supplements  you use. Also tell them if you smoke, drink alcohol, or use illegal drugs. Some items may interact with your medicine. What should I watch for while using this medication? Your condition will be monitored carefully while you are receiving this medicine. You may need blood work done while you are taking this medicine. This drug may make you feel generally unwell. This is not uncommon, as chemotherapy can affect healthy cells as well as cancer cells. Report any side effects. Continue your course of treatment even though you feel ill unless your doctor tells you to stop. Your urine  may turn orange-red for a few days after your dose. This is not blood. If your urine is dark or brown, call your doctor. In some cases, you may be given additional medicines to help with side effects. Follow all directions for their use. Talk to your doctor about your risk of cancer. You may be more at risk for certain types of cancers if you take this medicine. Do not become pregnant while taking this medicine or for 6 months after stopping it. Women should inform their healthcare professional if they wish to become pregnant or think they may be pregnant. Men should not father a child while taking this medicine and for 6 months after stopping it. There is a potential for serious side effects to an unborn child. Talk to your health care professional or pharmacist for more information. Do not breast-feed an infant while taking this medicine. This medicine has caused ovarian failure in some women. This medicine may make it more difficult to get pregnant. Talk to your healthcare professional if you are concerned about your fertility. This medicine has caused decreased sperm counts in some men. This may make it more difficult to father a child. Talk to your healthcare professional if you are concerned about your fertility. This medicine may cause a decrease in Co-Enzyme Q-10. You should make sure that you get enough Co-Enzyme Q-10 while you are taking this medicine. Discuss the foods you eat and the vitamins you take with your health care professional. What side effects may I notice from receiving this medication? Side effects that you should report to your doctor or health care professional as soon as possible: allergic reactions like skin rash, itching or hives, swelling of the face, lips, or tongue low blood counts - this medicine may decrease the number of white blood cells, red blood cells and platelets. You may be at increased risk for infections and bleeding. signs of hand-foot syndrome - tingling or  burning, redness, flaking, swelling, small blisters, or small sores on the palms of your hands or the soles of your feet signs of infection - fever or chills, cough, sore throat, pain or difficulty passing urine signs of decreased platelets or bleeding - bruising, pinpoint red spots on the skin, black, tarry stools, blood in the urine signs of decreased red blood cells - unusually weak or tired, fainting spells, lightheadedness back pain, chills, facial flushing, fever, headache, tightness in the chest or throat during the infusion breathing problems chest pain fast, irregular heartbeat mouth pain, redness, sores pain, swelling, redness at site where injected pain, tingling, numbness in the hands or feet swelling of ankles, feet, or hands vomiting Side effects that usually do not require medical attention (report to your doctor or health care professional if they continue or are bothersome): diarrhea hair loss loss of appetite nail discoloration or damage nausea red or watery eyes red colored urine stomach upset This list may  not describe all possible side effects. Call your doctor for medical advice about side effects. You may report side effects to FDA at 1-800-FDA-1088. Where should I keep my medication? This drug is given in a hospital or clinic and will not be stored at home. NOTE: This sheet is a summary. It may not cover all possible information. If you have questions about this medicine, talk to your doctor, pharmacist, or health care provider.  2022 Elsevier/Gold Standard (2017-10-20 00:00:00)

## 2021-01-04 LAB — CA 125: Cancer Antigen (CA) 125: 39 U/mL — ABNORMAL HIGH (ref 0.0–38.1)

## 2021-01-06 ENCOUNTER — Encounter: Payer: Self-pay | Admitting: Internal Medicine

## 2021-01-06 ENCOUNTER — Ambulatory Visit (INDEPENDENT_AMBULATORY_CARE_PROVIDER_SITE_OTHER): Payer: Medicare Other | Admitting: Internal Medicine

## 2021-01-06 ENCOUNTER — Other Ambulatory Visit: Payer: Self-pay

## 2021-01-06 VITALS — BP 128/78 | HR 75 | Ht 62.0 in | Wt 142.0 lb

## 2021-01-06 DIAGNOSIS — E041 Nontoxic single thyroid nodule: Secondary | ICD-10-CM

## 2021-01-06 DIAGNOSIS — E0590 Thyrotoxicosis, unspecified without thyrotoxic crisis or storm: Secondary | ICD-10-CM | POA: Diagnosis not present

## 2021-01-06 NOTE — Progress Notes (Signed)
Patient ID: Megan Waller, female   DOB: 02-Jun-1940, 80 y.o.   MRN: [id number]   This visit occurred during the SARS-CoV-2 public health emergency.  Safety protocols were in place, including screening questions prior to the visit, additional usage of staff PPE, and extensive cleaning of exam room while observing appropriate contact time as indicated for disinfecting solutions.   HPI  Megan Waller is a 80 y.o.-year-old female, initially referred by her PCP, Dr. Nyra Capes, returning for follow-up for thyrotoxicosis and a thyroid nodule.  She is here with her daughter who offers part of the history especially related to past medical history, current medications, and symptoms.  Last visit 6 months ago.  Interim history: Pt. has a history of stage IVb ovarian cancer with peritoneal disease and metastatic disease in the abdominal wall.  She had chemotherapy before TAH and BSO + debulking surgery in 09/2019 >> on Lynparza.  However, she had a recurrence and she restarted a different chemotherapy regimen in 11/2020 (once a mo - had the second tx 3 days ago).  She is getting 10 mg of dexamethasone with the treatments-last dose 01/03/2021 per review of the chart.  I received labs from PCP in 08/2020 and the TSH was suppressed at 0.022 so increase the methimazole back to 5 mg daily. No palpitations, weight loss, heat intolerance.  He has chronic tremors.  Thyrotoxicosis: Reviewed history: Patient has a long history of thyrotoxicosis (>10 years, unclear if Graves' disease), for which she was on methimazole for many years.  However, she developed significant anemia (hemoglobin 6-7) in 11/2019, so Methimazole was stopped (11/2019) b/c possible bone marrow suppression.    However, she has a significant history of ovarian cancer stage IV for which she started [place] in 10/2019 and, in retrospect, this may have been the cause for the bone marrow suppression.  Therefore, she was started back on  methimazole in 12/2019, when a TSH returned suppressed. She was advised to start at 5 mg daily but she actually started 10 mg daily. She was referred to endocrinology for further evaluation.  She is currently on: - Methimazole 5 >> 2.5 mg daily in 06/2020 >> advised her to increase to 5 mg daily in 08/2020 but did not get the message so she continued on 5 mg daily.!! - Coreg 25 mg 2x a day (chronic tx)  I reviewed pt's thyroid tests: Lab Results  Component Value Date   TSH 3.81 10/09/2020   TSH 4.40 07/04/2020   TSH 2.20 03/01/2020   FREET4 0.59 (L) 10/09/2020   FREET4 0.63 07/04/2020   FREET4 0.73 03/01/2020   T3FREE 3.0 10/09/2020   T3FREE 2.6 07/04/2020   T3FREE 3.5 03/01/2020  08/28/2020: TSH 0.022 (0.45-4.5), free T4 1.42 (0.82-1.77)  02/13/2020: (on MMI): TSH 2.2, free T4 0.94 01/03/2020: (off MMI): TSH 0.026, free T4 2.10 (0.82-1.77), WBC 3.8 (3.4-10.8), RBC 3.69 (3.77-5.20) platelets 239 (150-450) - after a blood transfusion 07/11/2019: TSH 0.011 (0.45-4.5)  Antithyroid antibodies: Lab Results  Component Value Date   TSI <89 03/01/2020   Reviewed patient CBC: Lab Results  Component Value Date   WBC 3.8 01/02/2021   HGB 11.9 (A) 01/02/2021   HCT 34 (A) 01/02/2021   MCV 102 (A) 05/08/2020   PLT 235 01/02/2021   Of note, she also has a history of thyroid nodule for which she had a remote benign thyroid biopsy.  Reviewed available imaging test results: CT chest with contrast (03/17/2019):  03/15/2020: Received records from Dr. Nyra Capes.  However, there is no further information about previous thyroid disease beyond the fact that she has a distant, reportedly benign, thyroid biopsy.  Pt denies: - feeling nodules in neck - hoarseness - dysphagia - choking - SOB with lying down  Pt does not have a FH of thyroid ds.: sister with thyroid ds (? Type). No FH of thyroid cancer. No h/o radiation tx to head or neck.   + steroid use - with ChTx (see above).  No herbal  supplements. No Biotin use.  Pt. also has a history of COPD, paroxysmal A. fib, generalized anxiety disorder, glaucoma, hyperlipidemia, HTN.  ROS: + see HPI  Past Medical History:  Diagnosis Date   Abdominal hernia    Acute on chronic respiratory failure with hypoxia ([place]) 03/25/2019   Anxiety    Anxiety    Atrial fibrillation ([place]) 03/24/2019   Chest pressure 09/18/2015   Community acquired bilateral lower lobe pneumonia 1/69/6789   Complication of anesthesia    COPD (chronic obstructive pulmonary disease) ([place]) 03/27/2019   Diverticulitis    Essential hypertension 09/18/2015   Family history of breast cancer    Family history of colon cancer    Family history of melanoma    Family history of prostate cancer    GAD (generalized anxiety disorder) 12/01/2015   Glaucoma    Hypercholesteremia    Hypertension    Hyperthyroidism    Hypokalemia 09/18/2015   Iron deficiency anemia, unspecified    Malignant neoplasm of ovary ([place])    Ovarian cancer ([place])    Parkinson disease ([place]) 12/01/2015   Primary insomnia 12/01/2015   Skin cancer    squamous cell   Weakness 09/18/2015   Past Surgical History:  Procedure Laterality Date   BREAST LUMPECTOMY     INCISIONAL HERNIA REPAIR N/A 08/15/2019   Procedure: HERNIA REPAIR INCISIONAL WITH MESH;  Surgeon: [name], MD;  Location: [facility];  Service: General;  Laterality: N/A;   LAPAROTOMY N/A 08/15/2019   Procedure: EXPLORATORY LAPAROTOMY;  Surgeon: [name], MD;  Location: [facility];  Service: General;  Laterality: N/A;  REQUESTING RNFA   LYSIS OF ADHESION N/A 08/15/2019   Procedure: LYSIS OF ADHESION;  Surgeon: [name], MD;  Location: [facility];  Service: General;  Laterality: N/A;   PARTIAL COLECTOMY     had partial colectomy for diverticular disease, required an ostomy followed by stoma revision   right hand surgery     squamous cell skin cancer resections     3   Social History   Socioeconomic History   Marital status:  Widowed    Spouse name: Not on file   Number of children: 3   Years of education: Not on file   Highest education level: Not on file  Occupational History   Not on file  Tobacco Use   Smoking status: Former   Smokeless tobacco: Never   Tobacco comments:    Quit 29 years ago  Vaping Use   Vaping Use: Never used  Substance and Sexual Activity   Alcohol use: Never   Drug use: Never   Sexual activity: Not on file  Other Topics Concern   Not on file  Social History Narrative   Not on file   Social Determinants of Health   Financial Resource Strain: Not on file  Food Insecurity: Not on file  Transportation Needs: Not on file  Physical Activity: Not on file  Stress: Not on file  Social Connections: Not on file  Intimate Partner  Violence: Not on file   Current Outpatient Medications on File Prior to Visit  Medication Sig Dispense Refill   aspirin 81 MG EC tablet Take by mouth.     diltiazem (CARDIZEM CD) 240 MG 24 hr capsule Take 240 mg by mouth daily.     DULoxetine (CYMBALTA) 60 MG capsule Take 60 mg by mouth daily.     hydrochlorothiazide (HYDRODIURIL) 12.5 MG tablet Take 12.5 mg by mouth daily.     losartan (COZAAR) 100 MG tablet Take 100 mg by mouth daily.     methimazole (TAPAZOLE) 5 MG tablet Take 0.5 tablets (2.5 mg total) by mouth daily. 45 tablet 3   olaparib (LYNPARZA) 150 MG tablet Take 300 mg by mouth in the am and 150 mg in the evening. Swallow whole. May take with food to decrease nausea and vomiting.     ondansetron (ZOFRAN) 4 MG tablet Take 1 tablet (4 mg total) by mouth every 4 (four) hours as needed for nausea. 90 tablet 3   prochlorperazine (COMPAZINE) 10 MG tablet Take 1 tablet (10 mg total) by mouth every 6 (six) hours as needed for nausea or vomiting. 90 tablet 3   Travoprost, BAK Free, (TRAVATAN) 0.004 % SOLN ophthalmic solution Place 1 drop into both eyes at bedtime.      Current Facility-Administered Medications on File Prior to Visit  Medication Dose  Route Frequency Provider Last Rate Last Admin   0.9 %  sodium chloride infusion (Manually program via Guardrails IV Fluids)  250 mL Intravenous Once [place] Scrape A, NP       acetaminophen (TYLENOL) tablet 650 mg  650 mg Oral Once [place] Scrape A, NP       diphenhydrAMINE (BENADRYL) capsule 25 mg  25 mg Oral Once [place] Scrape A, NP       heparin lock flush 100 unit/mL  500 Units Intracatheter Daily PRN [place] Scrape A, NP       heparin lock flush 100 unit/mL  250 Units Intracatheter PRN [place] Scrape A, NP       sodium chloride flush (NS) 0.9 % injection 10 mL  10 mL Intracatheter PRN [place] Scrape A, NP       sodium chloride flush (NS) 0.9 % injection 3 mL  3 mL Intracatheter PRN Melodye Ped, NP       Allergies  Allergen Reactions   Ezetimibe-Simvastatin Other (See Comments)    Aching   Lipitor [Atorvastatin]     Achy   Family History  Problem Relation Age of Onset   Stroke Mother    Heart attack Father    Skin cancer Father        non-melanoma; dx. in his late 80s   Breast cancer Sister 65   Prostate cancer Brother 4       metastatic   Colon cancer Brother 38   Melanoma Brother    Breast cancer Sister 38   Breast cancer Niece 70   Colon cancer Paternal Aunt        dx. in her 37s or 90s   Cancer Paternal Uncle        mole on back?, dx. in his 71s   Breast cancer Other 77       paternal great-aunt (grandmother's sister)   Melanoma Brother 33   Colon cancer Paternal Aunt        dx. older than 59   Throat cancer Paternal Aunt        dx. in her 35s  Breast cancer Cousin        dx. in her 76s or 45s (maternal first cousin)   Breast cancer Cousin        dx. in her 66s or 27s (maternal first cousin)   Breast cancer Cousin        dx. in her 62s (maternal first cousin)   PE: BP 128/78 (BP Location: Right Arm, Patient Position: Sitting, Cuff Size: Normal)   Pulse 75   Ht 5\' 2"  (1.575 m)   Wt 142 lb (64.4 kg)   SpO2 97%   BMI 25.97 kg/m  Wt  Readings from Last 3 Encounters:  01/06/21 142 lb (64.4 kg)  01/02/21 142 lb 14.4 oz (64.8 kg)  12/06/20 146 lb (66.2 kg)   Constitutional: normal weight, in NAD Eyes: PERRLA, EOMI, no exophthalmos, no lid lag, no stare ENT: moist mucous membranes, no thyromegaly, no thyroid nodules felt in her neck, no thyroid bruits, no cervical lymphadenopathy Cardiovascular: RRR, No MRG Respiratory: CTA B Gastrointestinal: abdomen soft, NT, ND, BS+ Musculoskeletal: + deformities (finger missing right hand), strength intact in all 4 Skin: moist, warm, no rashes Neurological: mild tremor with outstretched hands, DTR normal in all 4  ASSESSMENT: 1. Thyrotoxicosis  2. Thyroid nodule  PLAN:  1. Patient with history of a low TSH, without thyrotoxic symptoms: Weight loss, heat intolerance, hyper defecation, palpitations, but with anxiety and loose stools before restarting methimazole.  After starting methimazole, she is essentially asymptomatic.  She is also on Coreg 25 mg twice a day, which is chronic treatment for her, but hypertension -She did not appear to have exogenous causes for the low TSH, and the most likely diagnosis appears to be Graves' disease (although her TSI antibodies were not elevated), as opposed to thyroiditis or toxic multinodular goiter.  However, she does have a history of thyroid nodule, which was apparently biopsied with benign results in the past. -At last visit we discussed about options for treatment.  She is not a good candidate for RAI treatment or surgery due to her advanced ovarian cancer with myelosuppression.  There was a concern that her methimazole was suppressing her bone marrow, however, she was also on Lynparza, which was known to cause a myelodysplastic picture.  Indeed, after decreasing the dose of Lynparza, her hemoglobin recovered.  She is now off [place] completely as she had to restart chemotherapy in the setting of cancer recurrence. -At last visit, her TSH was  close to the upper limit of normal, at 4.4, so I advised her to decrease methimazole from 5 mg to 2.5 mg daily.  She had a repeat TSH with PCP in 08/2020, which showed a suppressed TSH at 0.02 after which we called her to increase the dose back to 5 mg daily.  She does not remember this and continues on 2.5 mg daily.  Interestingly, she had another TSH in 09/2020 and this was normal -At today's visit, I would have preferred to check her TFTs, however, she just had a large dose of dexamethasone 3 days ago.  While this may be mostly out of her system, the effect on the TSH may be still ongoing.  I advised her to return to have thyroid tests checked in 3 to 4 weeks, right before the next chemotherapy with dexamethasone dosing. -I will see her back in 6 months  2. Thyroid Nodule -Per review of her CT scan from 02/2019, she had a large, 3.5 x 3.4 cm thyroid nodule, with calcifications.  Reportedly, this  nodule was biopsied with benign results in the past -No neck compression symptoms and I do not feel this nodule on palpation today -At last visit, we discussed about options for follow-up and treatment but due to the advanced ovarian cancer diagnosis, we agreed that it would not be beneficial for her to pursue the thyroid nodule.   -We will continue to monitor her clinically for now  Orders Placed This Encounter  Procedures   TSH   T4, free   T3, free   Philemon Kingdom, MD PhD [facility]

## 2021-01-06 NOTE — Patient Instructions (Signed)
Please come back for labs in 3 to 4 weeks, before your next chemotherapy treatment.  Until then, please continue methimazole 2.5 mg daily.  Please come back for a follow-up appointment with me in 6 months.

## 2021-01-06 NOTE — Progress Notes (Signed)
Megan Waller  [address] [place],  [place]  [address] [phone]  Clinic Day:  01/13/2021  Referring physician: Marco Collie, MD  This document serves as a record of services personally performed by Megan Bruington Macarthur Critchley, MD. It was created on their behalf by Megan Waller, a trained medical scribe. The creation of this record is based on the scribe's personal observations and the provider's statements to them.  HISTORY OF PRESENT ILLNESS:  The patient is a 80 y.o. female with recurrent/metastatic ovarian cancer. She comes in today to be evaluated before heading into her 3rd cycle of carboplatin/Doxil.  Since her last visit, the patient has been doing well.  She tolerated her 2nd cycle of chemotherapy very well.  She denies having any abdominal pain or other symptoms which concern her for overt signs of disease recurrence.    With respect to her ovarian cancer, she initially presented with peritoneal disease and a metastatic nodule over her left abdominal wall.  She underwent 6 cycles of carboplatin/paclitaxel before undergoing a TAHBSO/maximum debulking surgery in August 2021.  The patient took olaparib maintenance therapy for numerous months until scans and a biopsy proved evidence of disease recurrence.   PHYSICAL EXAM:  Blood pressure (!) 204/84, pulse 74, temperature 98 F (36.7 C), resp. rate 14, height 5\' 2"  (1.575 m), weight 140 lb 12.8 oz (63.9 kg), SpO2 96 %. Wt Readings from Last 3 Encounters:  01/13/21 140 lb 12.8 oz (63.9 kg)  01/06/21 142 lb (64.4 kg)  01/02/21 142 lb 14.4 oz (64.8 kg)   Body mass index is 25.75 kg/m. Performance status (ECOG): 1 Physical Exam Constitutional:      General: She is not in acute distress.    Appearance: Normal appearance. She is normal weight.  HENT:     Head: Normocephalic and atraumatic.  Eyes:     General: No scleral icterus.    Extraocular Movements: Extraocular movements intact.      Conjunctiva/sclera: Conjunctivae normal.     Pupils: Pupils are equal, round, and reactive to light.  Cardiovascular:     Rate and Rhythm: Normal rate and regular rhythm.     Pulses: Normal pulses.     Heart sounds: Normal heart sounds. No murmur heard.   No friction rub. No gallop.  Pulmonary:     Effort: Pulmonary effort is normal. No respiratory distress.     Breath sounds: Normal breath sounds.  Abdominal:     General: Bowel sounds are normal. There is no distension.     Palpations: Abdomen is soft. There is no hepatomegaly, splenomegaly or mass.     Tenderness: There is no abdominal tenderness.  Musculoskeletal:        General: Normal range of motion.     Cervical back: Normal range of motion and neck supple.     Right lower leg: No edema.     Left lower leg: No edema.  Lymphadenopathy:     Cervical: No cervical adenopathy.  Skin:    General: Skin is warm and dry.     Comments: Previous abdominal skin nodule has dissipated   Neurological:     General: No focal deficit present.     Mental Status: She is alert and oriented to person, place, and time. Mental status is at baseline.  Psychiatric:        Mood and Affect: Mood normal.        Behavior: Behavior normal.        Thought  Content: Thought content normal.        Judgment: Judgment normal.   ASSESSMENT & PLAN:  Assessment/Plan:  A 80 y.o. female with recurrent/metastatic ovarian cancer.  She will proceed with her 3rd cycle of carboplatin/Doxil tomorrow.  Carboplatin is being given at an AUC of 5, while Doxil is being given at 30 mg/m2.  Clinically, she is doing well.  I will see her back in 4 weeks before she heads into her 4th cycle of carboplatin/Doxil.  CT scans of abdomen/pelvis will be repeated before her next visit to ascertain her new disease baseline after 3 cycles of carboplatin/Doxil.  The patient understands all the plans discussed today and is in agreement with them.    I, Megan Waller, am acting as scribe for  Megan Potter, MD    I have reviewed this report as typed by the medical scribe, and it is complete and accurate.  Megan Silvera Macarthur Critchley, MD

## 2021-01-08 ENCOUNTER — Encounter: Payer: Self-pay | Admitting: Oncology

## 2021-01-12 ENCOUNTER — Other Ambulatory Visit: Payer: Self-pay | Admitting: Oncology

## 2021-01-13 ENCOUNTER — Inpatient Hospital Stay (INDEPENDENT_AMBULATORY_CARE_PROVIDER_SITE_OTHER): Payer: Medicare Other | Admitting: Oncology

## 2021-01-13 ENCOUNTER — Other Ambulatory Visit: Payer: Self-pay | Admitting: Oncology

## 2021-01-13 VITALS — BP 204/84 | HR 74 | Temp 98.0°F | Resp 14 | Ht 62.0 in | Wt 140.8 lb

## 2021-01-13 DIAGNOSIS — C563 Malignant neoplasm of bilateral ovaries: Secondary | ICD-10-CM | POA: Diagnosis not present

## 2021-01-13 DIAGNOSIS — C562 Malignant neoplasm of left ovary: Secondary | ICD-10-CM

## 2021-01-15 ENCOUNTER — Encounter: Payer: Self-pay | Admitting: Oncology

## 2021-01-23 ENCOUNTER — Other Ambulatory Visit (INDEPENDENT_AMBULATORY_CARE_PROVIDER_SITE_OTHER): Payer: Medicare Other

## 2021-01-23 ENCOUNTER — Other Ambulatory Visit: Payer: Self-pay

## 2021-01-23 DIAGNOSIS — E0590 Thyrotoxicosis, unspecified without thyrotoxic crisis or storm: Secondary | ICD-10-CM | POA: Diagnosis not present

## 2021-01-23 DIAGNOSIS — F329 Major depressive disorder, single episode, unspecified: Secondary | ICD-10-CM | POA: Diagnosis not present

## 2021-01-23 DIAGNOSIS — I10 Essential (primary) hypertension: Secondary | ICD-10-CM | POA: Diagnosis not present

## 2021-01-23 DIAGNOSIS — E785 Hyperlipidemia, unspecified: Secondary | ICD-10-CM | POA: Diagnosis not present

## 2021-01-23 NOTE — Progress Notes (Signed)
[facility]  [address] [place],  [place]  [address] [phone]  Clinic Day:  01/30/2021  Referring physician: Marco Collie, MD  This document serves as a record of services personally performed by Elisheba Mcdonnell Macarthur Critchley, MD. It was created on their behalf by [facility] E, a trained medical scribe. The creation of this record is based on the scribe's personal observations and the provider's statements to them.  HISTORY OF PRESENT ILLNESS:  The patient is a 80 y.o. female with recurrent/metastatic ovarian cancer. She comes in today prior to her 3rd cycle of carboplatin/Doxil.  Since her last visit, the patient has been doing fine.  She tolerated her 2nd cycle of chemotherapy very well.  She denies having any abdominal pain or other symptoms which concern her for overt signs of disease recurrence.    With respect to her ovarian cancer, she initially presented with peritoneal disease and a metastatic nodule over her left abdominal wall.  She underwent 6 cycles of carboplatin/paclitaxel before undergoing a TAHBSO/maximum debulking surgery in August 2021.  The patient took olaparib maintenance therapy for numerous months until scans and a biopsy proved evidence of disease recurrence.   PHYSICAL EXAM:  Blood pressure (!) 201/79, pulse 79, temperature 98.2 F (36.8 C), resp. rate 14, height 5\' 2"  (1.575 m), weight 142 lb 11.2 oz (64.7 kg), SpO2 96 %. Wt Readings from Last 3 Encounters:  01/30/21 142 lb 11.2 oz (64.7 kg)  01/13/21 140 lb 12.8 oz (63.9 kg)  01/06/21 142 lb (64.4 kg)   Body mass index is 26.1 kg/m. Performance status (ECOG): 1 Physical Exam Constitutional:      General: She is not in acute distress.    Appearance: Normal appearance. She is normal weight.  HENT:     Head: Normocephalic and atraumatic.  Eyes:     General: No scleral icterus.    Extraocular Movements: Extraocular movements intact.     Conjunctiva/sclera: Conjunctivae normal.      Pupils: Pupils are equal, round, and reactive to light.  Cardiovascular:     Rate and Rhythm: Normal rate and regular rhythm.     Pulses: Normal pulses.     Heart sounds: Normal heart sounds. No murmur heard.   No friction rub. No gallop.  Pulmonary:     Effort: Pulmonary effort is normal. No respiratory distress.     Breath sounds: Normal breath sounds.  Abdominal:     General: Bowel sounds are normal. There is no distension.     Palpations: Abdomen is soft. There is no hepatomegaly, splenomegaly or mass.     Tenderness: There is no abdominal tenderness.  Musculoskeletal:        General: Normal range of motion.     Cervical back: Normal range of motion and neck supple.     Right lower leg: No edema.     Left lower leg: No edema.  Lymphadenopathy:     Cervical: No cervical adenopathy.  Skin:    General: Skin is warm and dry.     Comments:    Neurological:     General: No focal deficit present.     Mental Status: She is alert and oriented to person, place, and time. Mental status is at baseline.  Psychiatric:        Mood and Affect: Mood normal.        Behavior: Behavior normal.        Thought Content: Thought content normal.  Judgment: Judgment normal.   LABS:    Latest Reference Range & Units 01/30/21 00:00  Sodium 137 - 147  142 (E)  Potassium 3.4 - 5.3  3.6 (E)  Chloride 99 - 108  103 (E)  CO2 13 - 22  32 ! (E)  Glucose  119 (E)  BUN 4 - 21  16 (E)  Creatinine 0.5 - 1.1  0.7 (E)  Calcium 8.7 - 10.7  8.8 (E)  Alkaline Phosphatase 25 - 125  86 (E)  Albumin 3.5 - 5.0  3.9 (E)  AST 13 - 35  22 (E)  ALT 7 - 35  18 (E)  Bilirubin, Total  0.6 (E)  WBC  4.6 (E)  RBC 3.87 - 5.11  3.27 ! (E)  Hemoglobin 12.0 - 16.0  11.7 ! (E)  HCT 36 - 46  35 ! (E)  Platelets 150 - 399  211 (E)  !: Data is abnormal (E): External lab result   Latest Reference Range & Units 01/02/21 15:48 01/30/21 15:50  Cancer Antigen (CA) 125 0.0 - 38.1 U/mL 39.0 (H) 36.7  (H): Data is  abnormally high  ASSESSMENT & PLAN:  Assessment/Plan:  A 80 y.o. female with recurrent/metastatic ovarian cancer.  She will proceed with her 3rd cycle of carboplatin/Doxil tomorrow.  Carboplatin is being given at an AUC of 5, while Doxil is being given at 30 mg/m2.  Clinically, she is doing well.  I will see her back in 4 weeks before she heads into her 4th cycle of carboplatin/Doxil.  CT scans will be done before her next visit to ascertain her new disease baseline after 3 cycles of carboplatin/Doxil.  The patient understands all the plans discussed today and is in agreement with them.    I, Rita Ohara, am acting as scribe for Marice Potter, MD    I have reviewed this report as typed by the medical scribe, and it is complete and accurate.  Piper Hassebrock Macarthur Critchley, MD

## 2021-01-24 LAB — TSH: TSH: 0.17 u[IU]/mL — ABNORMAL LOW (ref 0.35–5.50)

## 2021-01-24 LAB — T3, FREE: T3, Free: 3.8 pg/mL (ref 2.3–4.2)

## 2021-01-24 LAB — T4, FREE: Free T4: 0.73 ng/dL (ref 0.60–1.60)

## 2021-01-27 ENCOUNTER — Other Ambulatory Visit: Payer: Self-pay | Admitting: Internal Medicine

## 2021-01-27 ENCOUNTER — Telehealth: Payer: Self-pay

## 2021-01-27 DIAGNOSIS — E0590 Thyrotoxicosis, unspecified without thyrotoxic crisis or storm: Secondary | ICD-10-CM

## 2021-01-27 MED ORDER — METHIMAZOLE 5 MG PO TABS: 5.0000 mg | ORAL_TABLET | Freq: Every day | ORAL | 3 refills | Status: DC

## 2021-01-27 NOTE — Telephone Encounter (Signed)
Yes, 1 or 2 days before.

## 2021-01-27 NOTE — Telephone Encounter (Signed)
Called and left vm for pt to call office back to schedule appt.

## 2021-01-27 NOTE — Telephone Encounter (Signed)
Pt called back and has Chemo scheduled for 02/28/21, this is a few days before 5 week mark. Should pt come in before chemo for labs?

## 2021-01-27 NOTE — Telephone Encounter (Signed)
-----   Message from Philemon Kingdom, MD sent at 01/27/2021  1:10 PM EST ----- Can you please call pt.:  TSH is slightly low. Let's increase the methimazole dose to 5 mg daily as discussed at the time of the visit that have her back for labs in ~5 weeks. Labs are in.

## 2021-01-27 NOTE — Telephone Encounter (Signed)
Called and left a message for pt to call back to discuss results and schedule follow up lab appt.

## 2021-01-30 ENCOUNTER — Other Ambulatory Visit: Payer: Self-pay | Admitting: Oncology

## 2021-01-30 ENCOUNTER — Telehealth: Payer: Self-pay | Admitting: Oncology

## 2021-01-30 ENCOUNTER — Other Ambulatory Visit: Payer: Medicare Other

## 2021-01-30 ENCOUNTER — Ambulatory Visit: Payer: Medicare Other | Admitting: Oncology

## 2021-01-30 ENCOUNTER — Inpatient Hospital Stay (INDEPENDENT_AMBULATORY_CARE_PROVIDER_SITE_OTHER): Payer: Medicare Other | Admitting: Oncology

## 2021-01-30 ENCOUNTER — Other Ambulatory Visit: Payer: Self-pay | Admitting: Hematology and Oncology

## 2021-01-30 ENCOUNTER — Inpatient Hospital Stay: Payer: Medicare Other | Attending: Oncology

## 2021-01-30 VITALS — BP 201/79 | HR 79 | Temp 98.2°F | Resp 14 | Ht 62.0 in | Wt 142.7 lb

## 2021-01-30 DIAGNOSIS — C562 Malignant neoplasm of left ovary: Secondary | ICD-10-CM

## 2021-01-30 DIAGNOSIS — Z90722 Acquired absence of ovaries, bilateral: Secondary | ICD-10-CM | POA: Diagnosis not present

## 2021-01-30 DIAGNOSIS — C569 Malignant neoplasm of unspecified ovary: Secondary | ICD-10-CM | POA: Insufficient documentation

## 2021-01-30 DIAGNOSIS — Z5111 Encounter for antineoplastic chemotherapy: Secondary | ICD-10-CM | POA: Insufficient documentation

## 2021-01-30 DIAGNOSIS — Z90710 Acquired absence of both cervix and uterus: Secondary | ICD-10-CM | POA: Insufficient documentation

## 2021-01-30 DIAGNOSIS — C563 Malignant neoplasm of bilateral ovaries: Secondary | ICD-10-CM

## 2021-01-30 DIAGNOSIS — C786 Secondary malignant neoplasm of retroperitoneum and peritoneum: Secondary | ICD-10-CM | POA: Diagnosis not present

## 2021-01-30 LAB — BASIC METABOLIC PANEL
BUN: 16 (ref 4–21)
CO2: 32 — AB (ref 13–22)
Chloride: 103 (ref 99–108)
Creatinine: 0.7 (ref 0.5–1.1)
Glucose: 119
Potassium: 3.6 (ref 3.4–5.3)
Sodium: 142 (ref 137–147)

## 2021-01-30 LAB — CBC AND DIFFERENTIAL
HCT: 35 — AB (ref 36–46)
Hemoglobin: 11.7 — AB (ref 12.0–16.0)
Neutrophils Absolute: 2.21
Platelets: 211 (ref 150–399)
WBC: 4.6

## 2021-01-30 LAB — HEPATIC FUNCTION PANEL
ALT: 18 (ref 7–35)
AST: 22 (ref 13–35)
Alkaline Phosphatase: 86 (ref 25–125)
Bilirubin, Total: 0.6

## 2021-01-30 LAB — CBC: RBC: 3.27 — AB (ref 3.87–5.11)

## 2021-01-30 LAB — COMPREHENSIVE METABOLIC PANEL
Albumin: 3.9 (ref 3.5–5.0)
Calcium: 8.8 (ref 8.7–10.7)

## 2021-01-30 MED FILL — Carboplatin IV Soln 450 MG/45ML: INTRAVENOUS | Qty: 36 | Status: AC

## 2021-01-30 MED FILL — Doxorubicin HCl Liposomal Inj (For IV Infusion) 2 MG/ML: INTRAVENOUS | Qty: 25 | Status: AC

## 2021-01-31 ENCOUNTER — Inpatient Hospital Stay: Payer: Medicare Other

## 2021-01-31 ENCOUNTER — Other Ambulatory Visit: Payer: Self-pay

## 2021-01-31 VITALS — BP 185/79 | HR 65 | Temp 97.6°F | Resp 18 | Ht 62.0 in | Wt 142.8 lb

## 2021-01-31 DIAGNOSIS — Z90710 Acquired absence of both cervix and uterus: Secondary | ICD-10-CM | POA: Diagnosis not present

## 2021-01-31 DIAGNOSIS — Z90722 Acquired absence of ovaries, bilateral: Secondary | ICD-10-CM | POA: Diagnosis not present

## 2021-01-31 DIAGNOSIS — C563 Malignant neoplasm of bilateral ovaries: Secondary | ICD-10-CM

## 2021-01-31 DIAGNOSIS — C569 Malignant neoplasm of unspecified ovary: Secondary | ICD-10-CM | POA: Diagnosis not present

## 2021-01-31 DIAGNOSIS — Z5111 Encounter for antineoplastic chemotherapy: Secondary | ICD-10-CM | POA: Diagnosis not present

## 2021-01-31 DIAGNOSIS — C786 Secondary malignant neoplasm of retroperitoneum and peritoneum: Secondary | ICD-10-CM | POA: Diagnosis not present

## 2021-01-31 MED ORDER — HEPARIN SOD (PORK) LOCK FLUSH 100 UNIT/ML IV SOLN
500.0000 [IU] | Freq: Once | INTRAVENOUS | Status: AC | PRN
  Administered 2021-01-31: 500 [IU]

## 2021-01-31 MED ORDER — PALONOSETRON HCL INJECTION 0.25 MG/5ML
0.2500 mg | Freq: Once | INTRAVENOUS | Status: AC
  Administered 2021-01-31: 0.25 mg via INTRAVENOUS
  Filled 2021-01-31: qty 5

## 2021-01-31 MED ORDER — SODIUM CHLORIDE 0.9 % IV SOLN
10.0000 mg | Freq: Once | INTRAVENOUS | Status: AC
  Administered 2021-01-31: 10 mg via INTRAVENOUS
  Filled 2021-01-31: qty 1

## 2021-01-31 MED ORDER — DIPHENHYDRAMINE HCL 50 MG/ML IJ SOLN
50.0000 mg | Freq: Once | INTRAMUSCULAR | Status: AC
  Administered 2021-01-31: 50 mg via INTRAVENOUS
  Filled 2021-01-31: qty 1

## 2021-01-31 MED ORDER — DEXTROSE 5 % IV SOLN: Freq: Once | INTRAVENOUS | Status: AC

## 2021-01-31 MED ORDER — SODIUM CHLORIDE 0.9 % IV SOLN
364.5000 mg | Freq: Once | INTRAVENOUS | Status: AC
  Administered 2021-01-31: 360 mg via INTRAVENOUS
  Filled 2021-01-31: qty 36

## 2021-01-31 MED ORDER — SODIUM CHLORIDE 0.9 % IV SOLN
150.0000 mg | Freq: Once | INTRAVENOUS | Status: AC
  Administered 2021-01-31: 150 mg via INTRAVENOUS
  Filled 2021-01-31: qty 5

## 2021-01-31 MED ORDER — FAMOTIDINE 20 MG IN NS 100 ML IVPB
20.0000 mg | Freq: Once | INTRAVENOUS | Status: AC
  Administered 2021-01-31: 20 mg via INTRAVENOUS
  Filled 2021-01-31: qty 100

## 2021-01-31 MED ORDER — SODIUM CHLORIDE 0.9% FLUSH
10.0000 mL | INTRAVENOUS | Status: DC | PRN
  Administered 2021-01-31: 10 mL

## 2021-01-31 MED ORDER — DOXORUBICIN HCL LIPOSOMAL CHEMO INJECTION 2 MG/ML
30.0000 mg/m2 | Freq: Once | INTRAVENOUS | Status: AC
  Administered 2021-01-31: 50 mg via INTRAVENOUS
  Filled 2021-01-31: qty 25

## 2021-01-31 NOTE — Patient Instructions (Signed)
Carboplatin injection What is this medication? CARBOPLATIN (KAR boe pla tin) is a chemotherapy drug. It targets fast dividing cells, like cancer cells, and causes these cells to die. This medicine is used to treat ovarian cancer and many other cancers. This medicine may be used for other purposes; ask your health care provider or pharmacist if you have questions. COMMON BRAND NAME(S): Paraplatin What should I tell my care team before I take this medication? They need to know if you have any of these conditions: blood disorders hearing problems kidney disease recent or ongoing radiation therapy an unusual or allergic reaction to carboplatin, cisplatin, other chemotherapy, other medicines, foods, dyes, or preservatives pregnant or trying to get pregnant breast-feeding How should I use this medication? This drug is usually given as an infusion into a vein. It is administered in a hospital or clinic by a specially trained health care professional. Talk to your pediatrician regarding the use of this medicine in children. Special care may be needed. Overdosage: If you think you have taken too much of this medicine contact a poison control center or emergency room at once. NOTE: This medicine is only for you. Do not share this medicine with others. What if I miss a dose? It is important not to miss a dose. Call your doctor or health care professional if you are unable to keep an appointment. What may interact with this medication? medicines for seizures medicines to increase blood counts like filgrastim, pegfilgrastim, sargramostim some antibiotics like amikacin, gentamicin, neomycin, streptomycin, tobramycin vaccines Talk to your doctor or health care professional before taking any of these medicines: acetaminophen aspirin ibuprofen ketoprofen naproxen This list may not describe all possible interactions. Give your health care provider a list of all the medicines, herbs, non-prescription  drugs, or dietary supplements you use. Also tell them if you smoke, drink alcohol, or use illegal drugs. Some items may interact with your medicine. What should I watch for while using this medication? Your condition will be monitored carefully while you are receiving this medicine. You will need important blood work done while you are taking this medicine. This drug may make you feel generally unwell. This is not uncommon, as chemotherapy can affect healthy cells as well as cancer cells. Report any side effects. Continue your course of treatment even though you feel ill unless your doctor tells you to stop. In some cases, you may be given additional medicines to help with side effects. Follow all directions for their use. Call your doctor or health care professional for advice if you get a fever, chills or sore throat, or other symptoms of a cold or flu. Do not treat yourself. This drug decreases your body's ability to fight infections. Try to avoid being around people who are sick. This medicine may increase your risk to bruise or bleed. Call your doctor or health care professional if you notice any unusual bleeding. Be careful brushing and flossing your teeth or using a toothpick because you may get an infection or bleed more easily. If you have any dental work done, tell your dentist you are receiving this medicine. Avoid taking products that contain aspirin, acetaminophen, ibuprofen, naproxen, or ketoprofen unless instructed by your doctor. These medicines may hide a fever. Do not become pregnant while taking this medicine. Women should inform their doctor if they wish to become pregnant or think they might be pregnant. There is a potential for serious side effects to an unborn child. Talk to your health care professional or pharmacist  for more information. Do not breast-feed an infant while taking this medicine. What side effects may I notice from receiving this medication? Side effects that you  should report to your doctor or health care professional as soon as possible: allergic reactions like skin rash, itching or hives, swelling of the face, lips, or tongue signs of infection - fever or chills, cough, sore throat, pain or difficulty passing urine signs of decreased platelets or bleeding - bruising, pinpoint red spots on the skin, black, tarry stools, nosebleeds signs of decreased red blood cells - unusually weak or tired, fainting spells, lightheadedness breathing problems changes in hearing changes in vision chest pain high blood pressure low blood counts - This drug may decrease the number of white blood cells, red blood cells and platelets. You may be at increased risk for infections and bleeding. nausea and vomiting pain, swelling, redness or irritation at the injection site pain, tingling, numbness in the hands or feet problems with balance, talking, walking trouble passing urine or change in the amount of urine Side effects that usually do not require medical attention (report to your doctor or health care professional if they continue or are bothersome): hair loss loss of appetite metallic taste in the mouth or changes in taste This list may not describe all possible side effects. Call your doctor for medical advice about side effects. You may report side effects to FDA at 1-800-FDA-1088. Where should I keep my medication? This drug is given in a hospital or clinic and will not be stored at home. NOTE: This sheet is a summary. It may not cover all possible information. If you have questions about this medicine, talk to your doctor, pharmacist, or health care provider.  2022 Elsevier/Gold Standard (2007-07-20 00:00:00) Doxorubicin Liposomal injection What is this medication? LIPOSOMAL DOXORUBICIN (LIP oh som al  dox oh ROO bi sin) is a chemotherapy drug. This medicine is used to treat many kinds of cancer like Kaposi's sarcoma, multiple myeloma, and ovarian  cancer. This medicine may be used for other purposes; ask your health care provider or pharmacist if you have questions. COMMON BRAND NAME(S): Doxil, Lipodox What should I tell my care team before I take this medication? They need to know if you have any of these conditions: blood disorders heart disease infection (especially a virus infection such as chickenpox, cold sores, or herpes) liver disease recent or ongoing radiation therapy an unusual or allergic reaction to doxorubicin, other chemotherapy agents, soybeans, other medicines, foods, dyes, or preservatives pregnant or trying to get pregnant breast-feeding How should I use this medication? This drug is given as an infusion into a vein. It is administered in a hospital or clinic by a specially trained health care professional. If you have pain, swelling, burning or any unusual feeling around the site of your injection, tell your health care professional right away. Talk to your pediatrician regarding the use of this medicine in children. Special care may be needed. Overdosage: If you think you have taken too much of this medicine contact a poison control center or emergency room at once. NOTE: This medicine is only for you. Do not share this medicine with others. What if I miss a dose? It is important not to miss your dose. Call your doctor or health care professional if you are unable to keep an appointment. What may interact with this medication? Do not take this medicine with any of the following medications: zidovudine This medicine may also interact with the following medications: medicines  to increase blood counts like filgrastim, pegfilgrastim, sargramostim vaccines Talk to your doctor or health care professional before taking any of these medicines: acetaminophen aspirin ibuprofen ketoprofen naproxen This list may not describe all possible interactions. Give your health care provider a list of all the medicines, herbs,  non-prescription drugs, or dietary supplements you use. Also tell them if you smoke, drink alcohol, or use illegal drugs. Some items may interact with your medicine. What should I watch for while using this medication? Your condition will be monitored carefully while you are receiving this medicine. You may need blood work done while you are taking this medicine. This drug may make you feel generally unwell. This is not uncommon, as chemotherapy can affect healthy cells as well as cancer cells. Report any side effects. Continue your course of treatment even though you feel ill unless your doctor tells you to stop. Your urine may turn orange-red for a few days after your dose. This is not blood. If your urine is dark or brown, call your doctor. In some cases, you may be given additional medicines to help with side effects. Follow all directions for their use. Talk to your doctor about your risk of cancer. You may be more at risk for certain types of cancers if you take this medicine. Do not become pregnant while taking this medicine or for 6 months after stopping it. Women should inform their healthcare professional if they wish to become pregnant or think they may be pregnant. Men should not father a child while taking this medicine and for 6 months after stopping it. There is a potential for serious side effects to an unborn child. Talk to your health care professional or pharmacist for more information. Do not breast-feed an infant while taking this medicine. This medicine has caused ovarian failure in some women. This medicine may make it more difficult to get pregnant. Talk to your healthcare professional if you are concerned about your fertility. This medicine has caused decreased sperm counts in some men. This may make it more difficult to father a child. Talk to your healthcare professional if you are concerned about your fertility. This medicine may cause a decrease in Co-Enzyme Q-10. You should  make sure that you get enough Co-Enzyme Q-10 while you are taking this medicine. Discuss the foods you eat and the vitamins you take with your health care professional. What side effects may I notice from receiving this medication? Side effects that you should report to your doctor or health care professional as soon as possible: allergic reactions like skin rash, itching or hives, swelling of the face, lips, or tongue low blood counts - this medicine may decrease the number of white blood cells, red blood cells and platelets. You may be at increased risk for infections and bleeding. signs of hand-foot syndrome - tingling or burning, redness, flaking, swelling, small blisters, or small sores on the palms of your hands or the soles of your feet signs of infection - fever or chills, cough, sore throat, pain or difficulty passing urine signs of decreased platelets or bleeding - bruising, pinpoint red spots on the skin, black, tarry stools, blood in the urine signs of decreased red blood cells - unusually weak or tired, fainting spells, lightheadedness back pain, chills, facial flushing, fever, headache, tightness in the chest or throat during the infusion breathing problems chest pain fast, irregular heartbeat mouth pain, redness, sores pain, swelling, redness at site where injected pain, tingling, numbness in the hands or feet  swelling of ankles, feet, or hands vomiting Side effects that usually do not require medical attention (report to your doctor or health care professional if they continue or are bothersome): diarrhea hair loss loss of appetite nail discoloration or damage nausea red or watery eyes red colored urine stomach upset This list may not describe all possible side effects. Call your doctor for medical advice about side effects. You may report side effects to FDA at 1-800-FDA-1088. Where should I keep my medication? This drug is given in a hospital or clinic and will not be  stored at home. NOTE: This sheet is a summary. It may not cover all possible information. If you have questions about this medicine, talk to your doctor, pharmacist, or health care provider.  2022 Elsevier/Gold Standard (2017-10-20 00:00:00)

## 2021-02-01 LAB — CA 125: Cancer Antigen (CA) 125: 36.7 U/mL (ref 0.0–38.1)

## 2021-02-03 ENCOUNTER — Encounter: Payer: Self-pay | Admitting: Oncology

## 2021-02-04 NOTE — Progress Notes (Signed)
Called and spoke with [facility], let them know patient is no longer taking the medication Lynparza.

## 2021-02-05 ENCOUNTER — Encounter: Payer: Self-pay | Admitting: Oncology

## 2021-02-10 ENCOUNTER — Other Ambulatory Visit: Payer: Self-pay

## 2021-02-10 ENCOUNTER — Other Ambulatory Visit (INDEPENDENT_AMBULATORY_CARE_PROVIDER_SITE_OTHER): Payer: Medicare Other

## 2021-02-10 ENCOUNTER — Telehealth: Payer: Self-pay | Admitting: Internal Medicine

## 2021-02-10 DIAGNOSIS — E0590 Thyrotoxicosis, unspecified without thyrotoxic crisis or storm: Secondary | ICD-10-CM

## 2021-02-10 NOTE — Telephone Encounter (Signed)
PT had Lab on 02/10/21 if new prescription are required please contact Daughter [name]) at [phone]

## 2021-02-11 LAB — T3, FREE: T3, Free: 3.6 pg/mL (ref 2.3–4.2)

## 2021-02-11 LAB — TSH: TSH: 1.09 u[IU]/mL (ref 0.35–5.50)

## 2021-02-11 LAB — T4, FREE: Free T4: 0.7 ng/dL (ref 0.60–1.60)

## 2021-02-13 ENCOUNTER — Encounter: Payer: Self-pay | Admitting: Oncology

## 2021-02-13 ENCOUNTER — Other Ambulatory Visit (HOSPITAL_COMMUNITY): Payer: Self-pay

## 2021-02-21 ENCOUNTER — Telehealth: Payer: Self-pay | Admitting: Internal Medicine

## 2021-02-21 DIAGNOSIS — E0590 Thyrotoxicosis, unspecified without thyrotoxic crisis or storm: Secondary | ICD-10-CM

## 2021-02-21 MED ORDER — METHIMAZOLE 5 MG PO TABS: 5.0000 mg | ORAL_TABLET | Freq: Every day | ORAL | 3 refills | Status: DC

## 2021-02-21 NOTE — Telephone Encounter (Signed)
Rx sent to preferred pharmacy.

## 2021-02-21 NOTE — Telephone Encounter (Signed)
Patient RX for Methimazole was changed to 5 MG.  The RX was not sent on 01/27/21 - it shows a status of No Print. Patient is requesting an RX for 5 MG Methimazole to be sent to [facility] on [address] in [place]  Call back number for clarification I [phone]

## 2021-02-21 NOTE — Progress Notes (Signed)
[facility]  [address] [place],  [place]  [address] [phone]  Clinic Day:  02/27/2021  Referring physician: Marco Collie, MD  This document serves as a record of services personally performed by Carder Yin Macarthur Critchley, MD. It was created on their behalf by [facility], a trained medical scribe. The creation of this record is based on the scribe's personal observations and the provider's statements to them.  HISTORY OF PRESENT ILLNESS:  The patient is a 80 y.o. female with recurrent/metastatic ovarian cancer. She comes in today to go over her CT scans to ascertain her new disease baseline after 3 cycles of carboplatin/Doxil.  Since her last visit, the patient has been doing fine.  She tolerated her 3rd cycle of chemotherapy very well.  She denies having any abdominal pain or other symptoms which concern her for overt signs of disease recurrence.    With respect to her ovarian cancer, she initially presented with peritoneal disease and a metastatic nodule over her left abdominal wall.  She underwent 6 cycles of carboplatin/paclitaxel before undergoing a TAHBSO/maximum debulking surgery in August 2021.  The patient took olaparib maintenance therapy for numerous months until scans and a biopsy showed evidence of disease recurrence.   PHYSICAL EXAM:  Blood pressure (!) 190/85, pulse (!) 50, temperature 98.3 F (36.8 C), resp. rate 14, height 5\' 2"  (1.575 m), weight 139 lb 8 oz (63.3 kg), SpO2 96 %. Wt Readings from Last 3 Encounters:  02/27/21 139 lb 8 oz (63.3 kg)  01/31/21 142 lb 12 oz (64.8 kg)  01/30/21 142 lb 11.2 oz (64.7 kg)   Body mass index is 25.51 kg/m. Performance status (ECOG): 1 Physical Exam Constitutional:      General: She is not in acute distress.    Appearance: Normal appearance. She is normal weight.  HENT:     Head: Normocephalic and atraumatic.  Eyes:     General: No scleral icterus.    Extraocular Movements: Extraocular  movements intact.     Conjunctiva/sclera: Conjunctivae normal.     Pupils: Pupils are equal, round, and reactive to light.  Cardiovascular:     Rate and Rhythm: Regular rhythm. Bradycardia present.     Pulses: Normal pulses.     Heart sounds: Normal heart sounds. No murmur heard.   No friction rub. No gallop.  Pulmonary:     Effort: Pulmonary effort is normal. No respiratory distress.     Breath sounds: Normal breath sounds.  Abdominal:     General: Bowel sounds are normal. There is no distension.     Palpations: Abdomen is soft. There is no hepatomegaly, splenomegaly or mass.     Tenderness: There is no abdominal tenderness.  Musculoskeletal:        General: Normal range of motion.     Cervical back: Normal range of motion and neck supple.     Right lower leg: No edema.     Left lower leg: No edema.  Lymphadenopathy:     Cervical: No cervical adenopathy.  Skin:    General: Skin is warm and dry.     Comments:    Neurological:     General: No focal deficit present.     Mental Status: She is alert and oriented to person, place, and time. Mental status is at baseline.  Psychiatric:        Mood and Affect: Mood normal.        Behavior: Behavior normal.  Thought Content: Thought content normal.        Judgment: Judgment normal.   SCANS: Recent CT abdomen and pelvis with contrast has revealed the following:  FINDINGS:  Lower chest: Lung bases are clear. Heart is at the upper limits of  normal in size. No pericardial or pleural effusion. Distal esophagus  is unremarkable.  Hepatobiliary: Liver may be slightly decreased in attenuation  diffusely. Stones layer in the gallbladder. No biliary ductal  dilatation.  Pancreas: Negative.  Spleen: Negative.  Adrenals/Urinary Tract: Right adrenal gland is unremarkable. Nodular  thickening of the left adrenal gland, unchanged. Subcentimeter  low-attenuation lesions in the kidneys are too small to  characterize. Kidneys are  otherwise unremarkable. Ureters are  decompressed. Bladder is grossly unremarkable.  Stomach/Bowel: Stomach, small bowel, appendix and colon are  unremarkable.  Vascular/Lymphatic: Atherosclerotic calcification of the aorta.  Abdominal peritoneal ligament and abdominal retroperitoneal lymph  nodes are not enlarged by CT size criteria. Right external iliac  adenopathy measures up to 1.6 x 2.1 cm (2/63), unchanged. Left external iliac lymph nodes measure up to 1.2 cm (2/67), also stable.  Bilateral inguinal adenopathy is similar to decreased in size, now  measuring up to 1.4 cm in short axis on the left (2/74), previously  2.1 cm on 11/28/2020.  Reproductive: Hysterectomy. No adnexal mass.  Other: Slight decrease in size of left-sided omental nodules,  measuring up to 10 x 12 mm (2/39), previously 12 x 12 mm.  Subcutaneous soft tissue nodules overlying the inferior left rectus  abdominus musculature have also decreased in size, now measuring up  to 6 x 12 mm (2/59), previously 9 x 12 mm on 11/28/2020.  Postoperative scarring along the lower left ventral pelvic wall.  Musculoskeletal: Degenerative changes in the spine. Probable  scattered bone islands. No worrisome lytic or sclerotic lesions.   IMPRESSION:  1. Overall minimal response to therapy as evidenced by similar to  minimally decreased size of omental, pelvic nodal and subcutaneous  left pelvic wall metastases.  2. Liver appears steatotic.  3. Cholelithiasis.  4. Aortic atherosclerosis (ICD10-I70.0).   LABS:    Latest Reference Range & Units 01/30/21 00:00  Sodium 137 - 147  142 (E)  Potassium 3.4 - 5.3  3.6 (E)  Chloride 99 - 108  103 (E)  CO2 13 - 22  32 ! (E)  Glucose  119 (E)  BUN 4 - 21  16 (E)  Creatinine 0.5 - 1.1  0.7 (E)  Calcium 8.7 - 10.7  8.8 (E)  Alkaline Phosphatase 25 - 125  86 (E)  Albumin 3.5 - 5.0  3.9 (E)  AST 13 - 35  22 (E)  ALT 7 - 35  18 (E)  Bilirubin, Total  0.6 (E)  WBC  4.6 (E)  RBC 3.87 -  5.11  3.27 ! (E)  Hemoglobin 12.0 - 16.0  11.7 ! (E)  HCT 36 - 46  35 ! (E)  Platelets 150 - 399  211 (E)  !: Data is abnormal (E): External lab result    ASSESSMENT & PLAN:  Assessment/Plan:  A 80 y.o. female with recurrent/metastatic ovarian cancer.  In clinic, I reviewed her CT images with her, for which she could see her disease is stable to slightly improved after 3 cycles of carboplatin/Doxil.  Of note, her CA125 level has improved since she started the second line chemotherapy regimen.  Clinically, she appears to be doing very well. She will proceed with her 4th cycle of carboplatin/Doxil tomorrow.  I will see her back in 4 weeks before she heads into her 5th cycle of carboplatin/Doxil.  The patient understands all the plans discussed today and is in agreement with them.    I, Rita Ohara, am acting as scribe for Marice Potter, MD    I have reviewed this report as typed by the medical scribe, and it is complete and accurate.  Eartha Vonbehren Macarthur Critchley, MD

## 2021-02-23 DIAGNOSIS — E785 Hyperlipidemia, unspecified: Secondary | ICD-10-CM | POA: Diagnosis not present

## 2021-02-23 DIAGNOSIS — F329 Major depressive disorder, single episode, unspecified: Secondary | ICD-10-CM | POA: Diagnosis not present

## 2021-02-23 DIAGNOSIS — I10 Essential (primary) hypertension: Secondary | ICD-10-CM | POA: Diagnosis not present

## 2021-02-25 ENCOUNTER — Telehealth: Payer: Self-pay

## 2021-02-25 DIAGNOSIS — M7989 Other specified soft tissue disorders: Secondary | ICD-10-CM | POA: Diagnosis not present

## 2021-02-25 DIAGNOSIS — Z6825 Body mass index (BMI) 25.0-25.9, adult: Secondary | ICD-10-CM | POA: Diagnosis not present

## 2021-02-25 NOTE — Telephone Encounter (Addendum)
I notified Megan Waller, pt's dtr, of below. She hates to take her mom to Urgent Care as the wait can be up to 2 hours long. She will call her moms PCP, Dr Pamella Pert (in with Dr Nyra Capes)  ----- Message from Marvia Pickles, PA-C sent at 02/25/2021 12:39 PM EST ----- Regarding: RE: Blood blister on 2-3rd toe on right foot since Thursday, now enormous Can she go to urgent care and get referred from there?  ----- Message ----- From: Dairl Ponder, RN Sent: 02/25/2021  11:01 AM EST To: Marice Potter, MD, Marvia Pickles, PA-C Subject: Blood blister on 2-3rd toe on right foot sin#  Pt's daughter, Megan Waller, called to ask what she needs to do with her mom? The area on her 2nd toe initially "looked like a blood blister". She thinks someone needs to see her foot today. She tried to get her mom to call on Friday but she wouldn't. The area now is enormous, & it is red. It doesn't feel feverish. She just wants to know where to start? She mentioned podiatrist. She doesn't think PCP could help. Pt is not a diabetic.   She is scheduled for scans and labs 02/26/21 and to see Lewis on 02/27/21. Last treatment was 01/31/2021.

## 2021-02-26 DIAGNOSIS — C562 Malignant neoplasm of left ovary: Secondary | ICD-10-CM | POA: Diagnosis not present

## 2021-02-26 DIAGNOSIS — R590 Localized enlarged lymph nodes: Secondary | ICD-10-CM | POA: Diagnosis not present

## 2021-02-26 DIAGNOSIS — C569 Malignant neoplasm of unspecified ovary: Secondary | ICD-10-CM | POA: Diagnosis not present

## 2021-02-26 DIAGNOSIS — I700 Atherosclerosis of aorta: Secondary | ICD-10-CM | POA: Diagnosis not present

## 2021-02-26 DIAGNOSIS — K8020 Calculus of gallbladder without cholecystitis without obstruction: Secondary | ICD-10-CM | POA: Diagnosis not present

## 2021-02-27 ENCOUNTER — Inpatient Hospital Stay: Payer: Medicare Other

## 2021-02-27 ENCOUNTER — Ambulatory Visit: Payer: Medicare Other | Admitting: Oncology

## 2021-02-27 ENCOUNTER — Inpatient Hospital Stay: Payer: Medicare Other | Attending: Oncology | Admitting: Oncology

## 2021-02-27 ENCOUNTER — Other Ambulatory Visit: Payer: Self-pay

## 2021-02-27 ENCOUNTER — Other Ambulatory Visit: Payer: Medicare Other

## 2021-02-27 VITALS — BP 190/85 | HR 50 | Temp 98.3°F | Resp 14 | Ht 62.0 in | Wt 139.5 lb

## 2021-02-27 DIAGNOSIS — I700 Atherosclerosis of aorta: Secondary | ICD-10-CM | POA: Insufficient documentation

## 2021-02-27 DIAGNOSIS — Z90722 Acquired absence of ovaries, bilateral: Secondary | ICD-10-CM | POA: Insufficient documentation

## 2021-02-27 DIAGNOSIS — Z5111 Encounter for antineoplastic chemotherapy: Secondary | ICD-10-CM | POA: Insufficient documentation

## 2021-02-27 DIAGNOSIS — C563 Malignant neoplasm of bilateral ovaries: Secondary | ICD-10-CM

## 2021-02-27 DIAGNOSIS — Z90710 Acquired absence of both cervix and uterus: Secondary | ICD-10-CM | POA: Insufficient documentation

## 2021-02-27 DIAGNOSIS — K8020 Calculus of gallbladder without cholecystitis without obstruction: Secondary | ICD-10-CM | POA: Insufficient documentation

## 2021-02-27 DIAGNOSIS — C569 Malignant neoplasm of unspecified ovary: Secondary | ICD-10-CM | POA: Insufficient documentation

## 2021-02-27 DIAGNOSIS — C786 Secondary malignant neoplasm of retroperitoneum and peritoneum: Secondary | ICD-10-CM | POA: Insufficient documentation

## 2021-02-27 DIAGNOSIS — Z9221 Personal history of antineoplastic chemotherapy: Secondary | ICD-10-CM | POA: Insufficient documentation

## 2021-02-27 MED FILL — Carboplatin IV Soln 450 MG/45ML: INTRAVENOUS | Qty: 36 | Status: AC

## 2021-02-27 MED FILL — Doxorubicin HCl Liposomal Inj (For IV Infusion) 2 MG/ML: INTRAVENOUS | Qty: 25 | Status: AC

## 2021-02-27 MED FILL — Fosaprepitant Dimeglumine For IV Infusion 150 MG (Base Eq): INTRAVENOUS | Qty: 5 | Status: AC

## 2021-02-27 MED FILL — Famotidine in NaCl 0.9% IV Soln 20 MG/50ML: INTRAVENOUS | Qty: 100 | Status: AC

## 2021-02-27 MED FILL — Dexamethasone Sodium Phosphate Inj 100 MG/10ML: INTRAMUSCULAR | Qty: 1 | Status: AC

## 2021-02-28 ENCOUNTER — Other Ambulatory Visit: Payer: Self-pay | Admitting: Hematology and Oncology

## 2021-02-28 ENCOUNTER — Other Ambulatory Visit: Payer: Self-pay

## 2021-02-28 ENCOUNTER — Inpatient Hospital Stay: Payer: Medicare Other

## 2021-02-28 VITALS — BP 170/77 | HR 81 | Temp 98.7°F | Resp 16 | Ht 62.0 in | Wt 139.8 lb

## 2021-02-28 DIAGNOSIS — I700 Atherosclerosis of aorta: Secondary | ICD-10-CM | POA: Diagnosis not present

## 2021-02-28 DIAGNOSIS — Z9221 Personal history of antineoplastic chemotherapy: Secondary | ICD-10-CM | POA: Diagnosis not present

## 2021-02-28 DIAGNOSIS — Z5111 Encounter for antineoplastic chemotherapy: Secondary | ICD-10-CM | POA: Diagnosis not present

## 2021-02-28 DIAGNOSIS — Z90722 Acquired absence of ovaries, bilateral: Secondary | ICD-10-CM | POA: Diagnosis not present

## 2021-02-28 DIAGNOSIS — Z90710 Acquired absence of both cervix and uterus: Secondary | ICD-10-CM | POA: Diagnosis not present

## 2021-02-28 DIAGNOSIS — C563 Malignant neoplasm of bilateral ovaries: Secondary | ICD-10-CM

## 2021-02-28 DIAGNOSIS — C786 Secondary malignant neoplasm of retroperitoneum and peritoneum: Secondary | ICD-10-CM | POA: Diagnosis not present

## 2021-02-28 DIAGNOSIS — K8020 Calculus of gallbladder without cholecystitis without obstruction: Secondary | ICD-10-CM | POA: Diagnosis not present

## 2021-02-28 DIAGNOSIS — C569 Malignant neoplasm of unspecified ovary: Secondary | ICD-10-CM | POA: Diagnosis not present

## 2021-02-28 MED ORDER — DIPHENHYDRAMINE HCL 50 MG/ML IJ SOLN
50.0000 mg | Freq: Once | INTRAMUSCULAR | Status: AC
  Administered 2021-02-28: 50 mg via INTRAVENOUS
  Filled 2021-02-28: qty 1

## 2021-02-28 MED ORDER — SODIUM CHLORIDE 0.9 % IV SOLN
364.5000 mg | Freq: Once | INTRAVENOUS | Status: AC
  Administered 2021-02-28: 360 mg via INTRAVENOUS
  Filled 2021-02-28: qty 36

## 2021-02-28 MED ORDER — HEPARIN SOD (PORK) LOCK FLUSH 100 UNIT/ML IV SOLN
500.0000 [IU] | Freq: Once | INTRAVENOUS | Status: AC | PRN
  Administered 2021-02-28: 500 [IU]

## 2021-02-28 MED ORDER — SODIUM CHLORIDE 0.9 % IV SOLN
150.0000 mg | Freq: Once | INTRAVENOUS | Status: AC
  Administered 2021-02-28: 150 mg via INTRAVENOUS
  Filled 2021-02-28: qty 150

## 2021-02-28 MED ORDER — SODIUM CHLORIDE 0.9% FLUSH
10.0000 mL | INTRAVENOUS | Status: DC | PRN
  Administered 2021-02-28: 10 mL

## 2021-02-28 MED ORDER — PALONOSETRON HCL INJECTION 0.25 MG/5ML
0.2500 mg | Freq: Once | INTRAVENOUS | Status: AC
  Administered 2021-02-28: 0.25 mg via INTRAVENOUS
  Filled 2021-02-28: qty 5

## 2021-02-28 MED ORDER — DEXTROSE 5 % IV SOLN: Freq: Once | INTRAVENOUS | Status: AC

## 2021-02-28 MED ORDER — FAMOTIDINE 20 MG IN NS 100 ML IVPB
20.0000 mg | Freq: Once | INTRAVENOUS | Status: AC
  Administered 2021-02-28: 20 mg via INTRAVENOUS
  Filled 2021-02-28: qty 20

## 2021-02-28 MED ORDER — DOXORUBICIN HCL LIPOSOMAL CHEMO INJECTION 2 MG/ML
30.0000 mg/m2 | Freq: Once | INTRAVENOUS | Status: AC
  Administered 2021-02-28: 50 mg via INTRAVENOUS
  Filled 2021-02-28: qty 25

## 2021-02-28 MED ORDER — POTASSIUM CHLORIDE CRYS ER 20 MEQ PO TBCR: 20.0000 meq | EXTENDED_RELEASE_TABLET | Freq: Two times a day (BID) | ORAL | 0 refills | Status: DC

## 2021-02-28 MED ORDER — SODIUM CHLORIDE 0.9 % IV SOLN
10.0000 mg | Freq: Once | INTRAVENOUS | Status: AC
  Administered 2021-02-28: 10 mg via INTRAVENOUS
  Filled 2021-02-28: qty 10

## 2021-02-28 NOTE — Telephone Encounter (Signed)
I notified pt's dtr that Melissa,NP, sent in new prescription.

## 2021-02-28 NOTE — Progress Notes (Signed)
1034: Melissa FNP called in potassium oral to pharmacy. Patient and family told to pick it up and start asap. 1434:PT STABLE AT TIME OF DISCHARGE

## 2021-02-28 NOTE — Patient Instructions (Signed)
[facility]  Discharge Instructions: Thank you for choosing [facility] to provide your oncology and hematology care.  If you have a lab appointment with the [facility], please go directly to the [facility] and check in at the registration area.   Wear comfortable clothing and clothing appropriate for easy access to any Portacath or PICC line.   We strive to give you quality time with your provider. You may need to reschedule your appointment if you arrive late (15 or more minutes).  Arriving late affects you and other patients whose appointments are after yours.  Also, if you miss three or more appointments without notifying the office, you may be dismissed from the clinic at the providers discretion.      For prescription refill requests, have your pharmacy contact our office and allow 72 hours for refills to be completed.    Today you received the following chemotherapy and/or immunotherapy agents Doxorubicin,Cyclphosphimide     To help prevent nausea and vomiting after your treatment, we encourage you to take your nausea medication as directed.  BELOW ARE SYMPTOMS THAT SHOULD BE REPORTED IMMEDIATELY: *FEVER GREATER THAN 100.4 F (38 C) OR HIGHER *CHILLS OR SWEATING *NAUSEA AND VOMITING THAT IS NOT CONTROLLED WITH YOUR NAUSEA MEDICATION *UNUSUAL SHORTNESS OF BREATH *UNUSUAL BRUISING OR BLEEDING *URINARY PROBLEMS (pain or burning when urinating, or frequent urination) *BOWEL PROBLEMS (unusual diarrhea, constipation, pain near the anus) TENDERNESS IN MOUTH AND THROAT WITH OR WITHOUT PRESENCE OF ULCERS (sore throat, sores in mouth, or a toothache) UNUSUAL RASH, SWELLING OR PAIN  UNUSUAL VAGINAL DISCHARGE OR ITCHING   Items with * indicate a potential emergency and should be followed up as soon as possible or go to the Emergency Department if any problems should occur.  Please show the CHEMOTHERAPY ALERT CARD or IMMUNOTHERAPY ALERT CARD at  check-in to the Emergency Department and triage nurse.  Should you have questions after your visit or need to cancel or reschedule your appointment, please contact [facility]  Dept: [phone]  and follow the prompts.  Office hours are 8:00 a.m. to 4:30 p.m. Monday - Friday. Please note that voicemails left after 4:00 p.m. may not be returned until the following business day.  We are closed weekends and major holidays. You have access to a nurse at all times for urgent questions. Please call the main number to the clinic Dept: [phone] and follow the prompts.  For any non-urgent questions, you may also contact your provider using MyChart. We now offer e-Visits for anyone 73 and older to request care online for non-urgent symptoms. For details visit [email or web address].   Also download the MyChart app! Go to the app store, search "MyChart", open the app, select [facility], and log in with your MyChart username and password.  Due to Covid, a mask is required upon entering the hospital/clinic. If you do not have a mask, one will be given to you upon arrival. For doctor visits, patients may have 1 support person aged 78 or older with them. For treatment visits, patients cannot have anyone with them due to current Covid guidelines and our immunocompromised population.

## 2021-03-02 ENCOUNTER — Encounter: Payer: Self-pay | Admitting: Oncology

## 2021-03-10 ENCOUNTER — Encounter: Payer: Self-pay | Admitting: Oncology

## 2021-03-12 DIAGNOSIS — I10 Essential (primary) hypertension: Secondary | ICD-10-CM | POA: Diagnosis not present

## 2021-03-17 DIAGNOSIS — H401132 Primary open-angle glaucoma, bilateral, moderate stage: Secondary | ICD-10-CM | POA: Diagnosis not present

## 2021-03-19 DIAGNOSIS — I10 Essential (primary) hypertension: Secondary | ICD-10-CM | POA: Diagnosis not present

## 2021-03-21 NOTE — Progress Notes (Signed)
[facility]  [address] [place],  [place]  [address] [phone]  Clinic Day:  03/27/2021  Referring physician: Marco Collie, MD  This document serves as a record of services personally performed by Asheton Viramontes Macarthur Critchley, MD. It was created on their behalf by [facility] E, a trained medical scribe. The creation of this record is based on the scribe's personal observations and the provider's statements to them.  HISTORY OF PRESENT ILLNESS:  The patient is a 81 y.o. female with recurrent/metastatic ovarian cancer. She comes in today prior to her 5th cycle of carboplatin/Doxil.  Since her last visit, the patient has been doing fine.  She tolerated her 4th cycle of chemotherapy very well.  She denies having any abdominal pain or other symptoms which concern her for overt signs of disease recurrence.    With respect to her ovarian cancer, she initially presented with peritoneal disease and a metastatic nodule over her left abdominal wall.  She underwent 6 cycles of carboplatin/paclitaxel before undergoing a TAHBSO/maximum debulking surgery in August 2021.  The patient took olaparib maintenance therapy for numerous months until scans and a biopsy showed evidence of disease recurrence.   PHYSICAL EXAM:  There were no vitals taken for this visit. Wt Readings from Last 3 Encounters:  02/28/21 139 lb 12 oz (63.4 kg)  02/27/21 139 lb 8 oz (63.3 kg)  01/31/21 142 lb 12 oz (64.8 kg)   There is no height or weight on file to calculate BMI. Performance status (ECOG): 1 Physical Exam Constitutional:      General: She is not in acute distress.    Appearance: Normal appearance. She is normal weight.  HENT:     Head: Normocephalic and atraumatic.  Eyes:     General: No scleral icterus.    Extraocular Movements: Extraocular movements intact.     Conjunctiva/sclera: Conjunctivae normal.     Pupils: Pupils are equal, round, and reactive to light.  Cardiovascular:      Rate and Rhythm: Regular rhythm. Bradycardia present.     Pulses: Normal pulses.     Heart sounds: Normal heart sounds. No murmur heard.   No friction rub. No gallop.  Pulmonary:     Effort: Pulmonary effort is normal. No respiratory distress.     Breath sounds: Normal breath sounds.  Abdominal:     General: Bowel sounds are normal. There is no distension.     Palpations: Abdomen is soft. There is no hepatomegaly, splenomegaly or mass.     Tenderness: There is no abdominal tenderness.  Musculoskeletal:        General: Normal range of motion.     Cervical back: Normal range of motion and neck supple.     Right lower leg: No edema.     Left lower leg: No edema.  Lymphadenopathy:     Cervical: No cervical adenopathy.  Skin:    General: Skin is warm and dry.     Comments:    Neurological:     General: No focal deficit present.     Mental Status: She is alert and oriented to person, place, and time. Mental status is at baseline.  Psychiatric:        Mood and Affect: Mood normal.        Behavior: Behavior normal.        Thought Content: Thought content normal.        Judgment: Judgment normal.    LABS:    Latest Reference Range &  Units 01/30/21 00:00  Sodium 137 - 147  142 (E)  Potassium 3.4 - 5.3  3.6 (E)  Chloride 99 - 108  103 (E)  CO2 13 - 22  32 ! (E)  Glucose  119 (E)  BUN 4 - 21  16 (E)  Creatinine 0.5 - 1.1  0.7 (E)  Calcium 8.7 - 10.7  8.8 (E)  Alkaline Phosphatase 25 - 125  86 (E)  Albumin 3.5 - 5.0  3.9 (E)  AST 13 - 35  22 (E)  ALT 7 - 35  18 (E)  Bilirubin, Total  0.6 (E)  WBC  4.6 (E)  RBC 3.87 - 5.11  3.27 ! (E)  Hemoglobin 12.0 - 16.0  11.7 ! (E)  HCT 36 - 46  35 ! (E)  Platelets 150 - 399  211 (E)  !: Data is abnormal (E): External lab result  Latest Reference Range & Units 03/27/21 16:15  Cancer Antigen (CA) 125 0.0 - 38.1 U/mL 43.8 (H)  (H): Data is abnormally high   ASSESSMENT & PLAN:  Assessment/Plan:  A 81 y.o. female with  recurrent/metastatic ovarian cancer. She will proceed with her 5th cycle of carboplatin/Doxil tomorrow.  Of note, it does appear her CA125 level has risen slightly since her last visit.  This level will continue to be followed while successive cycles of chemotherapy.  Clinically, the patient appears to be doing well.  I will see her back in 4 weeks before she heads into her 6th cycle of carboplatin/Doxil.  The patient understands all the plans discussed today and is in agreement with them.    I, Rita Ohara, am acting as scribe for Marice Potter, MD    I have reviewed this report as typed by the medical scribe, and it is complete and accurate.  Scarlett Portlock Macarthur Critchley, MD

## 2021-03-26 DIAGNOSIS — I4891 Unspecified atrial fibrillation: Secondary | ICD-10-CM | POA: Diagnosis not present

## 2021-03-26 DIAGNOSIS — I10 Essential (primary) hypertension: Secondary | ICD-10-CM | POA: Diagnosis not present

## 2021-03-26 DIAGNOSIS — E785 Hyperlipidemia, unspecified: Secondary | ICD-10-CM | POA: Diagnosis not present

## 2021-03-27 ENCOUNTER — Inpatient Hospital Stay (INDEPENDENT_AMBULATORY_CARE_PROVIDER_SITE_OTHER): Payer: Medicare Other | Admitting: Oncology

## 2021-03-27 ENCOUNTER — Other Ambulatory Visit: Payer: Self-pay | Admitting: Hematology and Oncology

## 2021-03-27 ENCOUNTER — Telehealth: Payer: Self-pay | Admitting: Oncology

## 2021-03-27 ENCOUNTER — Other Ambulatory Visit: Payer: Self-pay | Admitting: Pharmacist

## 2021-03-27 ENCOUNTER — Inpatient Hospital Stay: Payer: Medicare Other | Attending: Oncology

## 2021-03-27 ENCOUNTER — Other Ambulatory Visit: Payer: Self-pay

## 2021-03-27 VITALS — BP 203/86 | HR 82 | Temp 97.6°F | Resp 16 | Ht 62.0 in | Wt 142.9 lb

## 2021-03-27 DIAGNOSIS — C563 Malignant neoplasm of bilateral ovaries: Secondary | ICD-10-CM

## 2021-03-27 DIAGNOSIS — Z90710 Acquired absence of both cervix and uterus: Secondary | ICD-10-CM | POA: Insufficient documentation

## 2021-03-27 DIAGNOSIS — C786 Secondary malignant neoplasm of retroperitoneum and peritoneum: Secondary | ICD-10-CM | POA: Insufficient documentation

## 2021-03-27 DIAGNOSIS — Z90722 Acquired absence of ovaries, bilateral: Secondary | ICD-10-CM | POA: Insufficient documentation

## 2021-03-27 DIAGNOSIS — Z5111 Encounter for antineoplastic chemotherapy: Secondary | ICD-10-CM | POA: Insufficient documentation

## 2021-03-27 DIAGNOSIS — C569 Malignant neoplasm of unspecified ovary: Secondary | ICD-10-CM | POA: Diagnosis not present

## 2021-03-27 DIAGNOSIS — C562 Malignant neoplasm of left ovary: Secondary | ICD-10-CM

## 2021-03-27 LAB — BASIC METABOLIC PANEL
BUN: 14 (ref 4–21)
CO2: 32 — AB (ref 13–22)
Chloride: 104 (ref 99–108)
Creatinine: 0.8 (ref 0.5–1.1)
Glucose: 92
Potassium: 3.8 (ref 3.4–5.3)
Sodium: 142 (ref 137–147)

## 2021-03-27 LAB — COMPREHENSIVE METABOLIC PANEL
Albumin: 4.1 (ref 3.5–5.0)
Calcium: 8.9 (ref 8.7–10.7)

## 2021-03-27 LAB — CBC AND DIFFERENTIAL
HCT: 35 — AB (ref 36–46)
Hemoglobin: 11.8 — AB (ref 12.0–16.0)
Neutrophils Absolute: 1.48
Platelets: 209 (ref 150–399)
WBC: 3.6

## 2021-03-27 LAB — HEPATIC FUNCTION PANEL
ALT: 17 (ref 7–35)
AST: 24 (ref 13–35)
Alkaline Phosphatase: 97 (ref 25–125)
Bilirubin, Total: 0.5

## 2021-03-27 LAB — CBC: RBC: 3.47 — AB (ref 3.87–5.11)

## 2021-03-27 MED FILL — Carboplatin IV Soln 450 MG/45ML: INTRAVENOUS | Qty: 36 | Status: AC

## 2021-03-27 MED FILL — Doxorubicin HCl Liposomal Inj (For IV Infusion) 2 MG/ML: INTRAVENOUS | Qty: 25 | Status: AC

## 2021-03-27 NOTE — Telephone Encounter (Signed)
Per 03/27/21 los next appt confirmed and confirmed with patient

## 2021-03-28 ENCOUNTER — Encounter: Payer: Self-pay | Admitting: Oncology

## 2021-03-28 ENCOUNTER — Inpatient Hospital Stay: Payer: Medicare Other

## 2021-03-28 VITALS — BP 172/61 | HR 88 | Temp 97.9°F | Resp 16 | Ht 62.0 in | Wt 143.5 lb

## 2021-03-28 DIAGNOSIS — C786 Secondary malignant neoplasm of retroperitoneum and peritoneum: Secondary | ICD-10-CM | POA: Diagnosis not present

## 2021-03-28 DIAGNOSIS — C569 Malignant neoplasm of unspecified ovary: Secondary | ICD-10-CM | POA: Diagnosis not present

## 2021-03-28 DIAGNOSIS — Z90710 Acquired absence of both cervix and uterus: Secondary | ICD-10-CM | POA: Diagnosis not present

## 2021-03-28 DIAGNOSIS — C563 Malignant neoplasm of bilateral ovaries: Secondary | ICD-10-CM

## 2021-03-28 DIAGNOSIS — Z5111 Encounter for antineoplastic chemotherapy: Secondary | ICD-10-CM | POA: Diagnosis not present

## 2021-03-28 DIAGNOSIS — Z90722 Acquired absence of ovaries, bilateral: Secondary | ICD-10-CM | POA: Diagnosis not present

## 2021-03-28 LAB — CA 125: Cancer Antigen (CA) 125: 43.8 U/mL — ABNORMAL HIGH (ref 0.0–38.1)

## 2021-03-28 MED ORDER — SODIUM CHLORIDE 0.9 % IV SOLN
150.0000 mg | Freq: Once | INTRAVENOUS | Status: AC
  Administered 2021-03-28: 150 mg via INTRAVENOUS
  Filled 2021-03-28: qty 5

## 2021-03-28 MED ORDER — PALONOSETRON HCL INJECTION 0.25 MG/5ML
0.2500 mg | Freq: Once | INTRAVENOUS | Status: AC
  Administered 2021-03-28: 0.25 mg via INTRAVENOUS
  Filled 2021-03-28: qty 5

## 2021-03-28 MED ORDER — DIPHENHYDRAMINE HCL 50 MG/ML IJ SOLN
50.0000 mg | Freq: Once | INTRAMUSCULAR | Status: AC
  Administered 2021-03-28: 50 mg via INTRAVENOUS
  Filled 2021-03-28: qty 1

## 2021-03-28 MED ORDER — FAMOTIDINE IN NACL 20-0.9 MG/50ML-% IV SOLN
20.0000 mg | Freq: Once | INTRAVENOUS | Status: AC
  Administered 2021-03-28: 20 mg via INTRAVENOUS
  Filled 2021-03-28: qty 50

## 2021-03-28 MED ORDER — DEXTROSE 5 % IV SOLN: Freq: Once | INTRAVENOUS | Status: AC

## 2021-03-28 MED ORDER — SODIUM CHLORIDE 0.9% FLUSH
10.0000 mL | INTRAVENOUS | Status: DC | PRN
  Administered 2021-03-28: 10 mL

## 2021-03-28 MED ORDER — HEPARIN SOD (PORK) LOCK FLUSH 100 UNIT/ML IV SOLN
500.0000 [IU] | Freq: Once | INTRAVENOUS | Status: AC | PRN
  Administered 2021-03-28: 500 [IU]

## 2021-03-28 MED ORDER — SODIUM CHLORIDE 0.9 % IV SOLN
10.0000 mg | Freq: Once | INTRAVENOUS | Status: AC
  Administered 2021-03-28: 10 mg via INTRAVENOUS
  Filled 2021-03-28: qty 1

## 2021-03-28 MED ORDER — DOXORUBICIN HCL LIPOSOMAL CHEMO INJECTION 2 MG/ML
30.0000 mg/m2 | Freq: Once | INTRAVENOUS | Status: AC
  Administered 2021-03-28: 50 mg via INTRAVENOUS
  Filled 2021-03-28: qty 25

## 2021-03-28 MED ORDER — SODIUM CHLORIDE 0.9 % IV SOLN
364.5000 mg | Freq: Once | INTRAVENOUS | Status: AC
  Administered 2021-03-28: 360 mg via INTRAVENOUS
  Filled 2021-03-28: qty 36

## 2021-03-28 NOTE — Progress Notes (Signed)
Proceed with liposomal doxorubicin and carboplatin despite ANC = 1480 per Dr. Bobby Rumpf.

## 2021-03-28 NOTE — Progress Notes (Signed)
1435:PT STABLE AT TIME OF DISCHARGE

## 2021-03-30 ENCOUNTER — Other Ambulatory Visit: Payer: Self-pay | Admitting: Oncology

## 2021-03-30 ENCOUNTER — Encounter: Payer: Self-pay | Admitting: Oncology

## 2021-03-31 ENCOUNTER — Encounter: Payer: Self-pay | Admitting: Oncology

## 2021-04-18 NOTE — Progress Notes (Signed)
?[facility]  ?[address] ?[place],  [place]  [address] ?[phone] ? ?Clinic Day:  04/24/2021 ? ?Referring physician: Marco Collie, MD ? ?This document serves as a record of services personally performed by Marice Potter, MD. It was created on their behalf by Curry,Lauren E, a trained medical scribe. The creation of this record is based on the scribe's personal observations and the provider's statements to them. ? ?HISTORY OF PRESENT ILLNESS:  ?The patient is a 81 y.o. female with recurrent/metastatic ovarian cancer. She comes in today prior to her 6th and final cycle of carboplatin/Doxil.  Since her last visit, the patient has been doing fine.  She tolerated her 5th cycle of chemotherapy very well.  She denies having any abdominal pain or other symptoms which concern her for overt signs of disease progression.   ? ?With respect to her ovarian cancer, she initially presented with peritoneal disease and a metastatic nodule over her left abdominal wall.  She underwent 6 cycles of carboplatin/paclitaxel before undergoing a TAHBSO/maximum debulking surgery in August 2021.  The patient took olaparib maintenance therapy for numerous months until scans and a biopsy showed evidence of disease recurrence.  ? ?PHYSICAL EXAM:  ?Blood pressure (!) 193/83, pulse 81, temperature 98.3 ?F (36.8 ?C), resp. rate 14, height 5\' 2"  (1.575 m), weight 142 lb 8 oz (64.6 kg), SpO2 98 %. ?Wt Readings from Last 3 Encounters:  ?04/24/21 142 lb 8 oz (64.6 kg)  ?03/28/21 143 lb 8 oz (65.1 kg)  ?03/27/21 142 lb 14.4 oz (64.8 kg)  ? ?Body mass index is 26.06 kg/m?Marland Kitchen ?Performance status (ECOG): 1 ?Physical Exam ?Constitutional:   ?   General: She is not in acute distress. ?   Appearance: Normal appearance. She is normal weight.  ?HENT:  ?   Head: Normocephalic and atraumatic.  ?Eyes:  ?   General: No scleral icterus. ?   Extraocular Movements: Extraocular movements intact.  ?   Conjunctiva/sclera:  Conjunctivae normal.  ?   Pupils: Pupils are equal, round, and reactive to light.  ?Cardiovascular:  ?   Rate and Rhythm: Regular rhythm. Bradycardia present.  ?   Pulses: Normal pulses.  ?   Heart sounds: Normal heart sounds. No murmur heard. ?  No friction rub. No gallop.  ?Pulmonary:  ?   Effort: Pulmonary effort is normal. No respiratory distress.  ?   Breath sounds: Normal breath sounds.  ?Abdominal:  ?   General: Bowel sounds are normal. There is no distension.  ?   Palpations: Abdomen is soft. There is no hepatomegaly, splenomegaly or mass.  ?   Tenderness: There is no abdominal tenderness.  ?Musculoskeletal:     ?   General: Normal range of motion.  ?   Cervical back: Normal range of motion and neck supple.  ?   Right lower leg: No edema.  ?   Left lower leg: No edema.  ?Lymphadenopathy:  ?   Cervical: No cervical adenopathy.  ?Skin: ?   General: Skin is warm and dry.  ?   Comments:  ?  ?Neurological:  ?   General: No focal deficit present.  ?   Mental Status: She is alert and oriented to person, place, and time. Mental status is at baseline.  ?Psychiatric:     ?   Mood and Affect: Mood normal.     ?   Behavior: Behavior normal.     ?   Thought Content: Thought content normal.     ?  Judgment: Judgment normal.  ? ? ?LABS:   ? Latest Reference Range & Units 04/24/21 00:00  ?Sodium 137 - 147  141  ?Potassium 3.4 - 5.3  3.7  ?Chloride 99 - 108  104  ?CO2 13 - 22  31 !  ?Glucose  96  ?BUN 4 - 21  15  ?Creatinine 0.5 - 1.1  0.7  ?Calcium 8.7 - 10.7  9.1  ?Alkaline Phosphatase 25 - 125  98  ?Albumin 3.5 - 5.0  4.1  ?AST 13 - 35  22  ?ALT 7 - 35  16  ?Bilirubin, Total  0.5  ?WBC  4.3  ?RBC 3.87 - 5.11  3.65 !  ?Hemoglobin 12.0 - 16.0  12.3  ?HCT 36 - 46  37  ?Platelets 150 - 399  176  ?NEUT#  2.02  ?!: Data is abnormal ? ? Latest Reference Range & Units 02/01/20 15:48 04/08/20 15:54 05/08/20 15:59 10/10/20 15:50 01/02/21 15:48 01/30/21 15:50 03/27/21 16:15 04/24/21 16:20  ?Cancer Antigen (CA) 125 0.0 - 38.1 U/mL  8.4 9.9 11.1 23.8 39.0 (H) 36.7 43.8 (H) 58.3 (H)  ?(H): Data is abnormally high ? ? ?ASSESSMENT & PLAN:  ?Assessment/Plan:  A 81 y.o. female with recurrent/metastatic ovarian cancer. She will proceed with her 6th and final cycle of carboplatin/Doxil tomorrow.  Of note, her CA125 level has been rising despite receiving her last few cycles of chemotherapy.  Although she is clinically doing very well, my concern is this may represent subtle disease progression.  I will see her back in 1 month for repeat clinical assessment.  CT scans will be done a day before her next visit to ascertain her new disease baseline after 6 cycles of carboplatin/Doxil.  The patient understands all the plans discussed today and is in agreement with them.   ? ?I, Rita Ohara, am acting as scribe for Marice Potter, MD   ? ?I have reviewed this report as typed by the medical scribe, and it is complete and accurate. ? ?Iasia Forcier Macarthur Critchley, MD ? ? ? ?  ?

## 2021-04-23 DIAGNOSIS — E785 Hyperlipidemia, unspecified: Secondary | ICD-10-CM | POA: Diagnosis not present

## 2021-04-23 DIAGNOSIS — I10 Essential (primary) hypertension: Secondary | ICD-10-CM | POA: Diagnosis not present

## 2021-04-23 DIAGNOSIS — I4891 Unspecified atrial fibrillation: Secondary | ICD-10-CM | POA: Diagnosis not present

## 2021-04-24 ENCOUNTER — Inpatient Hospital Stay: Payer: Medicare Other

## 2021-04-24 ENCOUNTER — Inpatient Hospital Stay: Payer: Medicare Other | Attending: Oncology | Admitting: Oncology

## 2021-04-24 ENCOUNTER — Other Ambulatory Visit: Payer: Self-pay

## 2021-04-24 ENCOUNTER — Encounter: Payer: Self-pay | Admitting: Oncology

## 2021-04-24 ENCOUNTER — Other Ambulatory Visit: Payer: Self-pay | Admitting: Oncology

## 2021-04-24 VITALS — BP 193/83 | HR 81 | Temp 98.3°F | Resp 14 | Ht 62.0 in | Wt 142.5 lb

## 2021-04-24 DIAGNOSIS — C786 Secondary malignant neoplasm of retroperitoneum and peritoneum: Secondary | ICD-10-CM | POA: Insufficient documentation

## 2021-04-24 DIAGNOSIS — Z90710 Acquired absence of both cervix and uterus: Secondary | ICD-10-CM | POA: Insufficient documentation

## 2021-04-24 DIAGNOSIS — Z5111 Encounter for antineoplastic chemotherapy: Secondary | ICD-10-CM | POA: Insufficient documentation

## 2021-04-24 DIAGNOSIS — C569 Malignant neoplasm of unspecified ovary: Secondary | ICD-10-CM | POA: Diagnosis not present

## 2021-04-24 DIAGNOSIS — C563 Malignant neoplasm of bilateral ovaries: Secondary | ICD-10-CM

## 2021-04-24 DIAGNOSIS — Z90722 Acquired absence of ovaries, bilateral: Secondary | ICD-10-CM | POA: Insufficient documentation

## 2021-04-24 DIAGNOSIS — C562 Malignant neoplasm of left ovary: Secondary | ICD-10-CM

## 2021-04-24 LAB — COMPREHENSIVE METABOLIC PANEL
Albumin: 4.1 (ref 3.5–5.0)
Calcium: 9.1 (ref 8.7–10.7)

## 2021-04-24 LAB — CBC AND DIFFERENTIAL
HCT: 37 (ref 36–46)
Hemoglobin: 12.3 (ref 12.0–16.0)
Neutrophils Absolute: 2.02
Platelets: 176 (ref 150–399)
WBC: 4.3

## 2021-04-24 LAB — BASIC METABOLIC PANEL
BUN: 15 (ref 4–21)
CO2: 31 — AB (ref 13–22)
Chloride: 104 (ref 99–108)
Creatinine: 0.7 (ref 0.5–1.1)
Glucose: 96
Potassium: 3.7 (ref 3.4–5.3)
Sodium: 141 (ref 137–147)

## 2021-04-24 LAB — HEPATIC FUNCTION PANEL
ALT: 16 (ref 7–35)
AST: 22 (ref 13–35)
Alkaline Phosphatase: 98 (ref 25–125)
Bilirubin, Total: 0.5

## 2021-04-24 LAB — CBC: RBC: 3.65 — AB (ref 3.87–5.11)

## 2021-04-25 ENCOUNTER — Inpatient Hospital Stay: Payer: Medicare Other

## 2021-04-25 VITALS — BP 187/80 | HR 79 | Resp 16 | Ht 61.58 in | Wt 140.8 lb

## 2021-04-25 DIAGNOSIS — C569 Malignant neoplasm of unspecified ovary: Secondary | ICD-10-CM | POA: Diagnosis not present

## 2021-04-25 DIAGNOSIS — Z5111 Encounter for antineoplastic chemotherapy: Secondary | ICD-10-CM | POA: Diagnosis not present

## 2021-04-25 DIAGNOSIS — C786 Secondary malignant neoplasm of retroperitoneum and peritoneum: Secondary | ICD-10-CM | POA: Diagnosis not present

## 2021-04-25 DIAGNOSIS — Z90722 Acquired absence of ovaries, bilateral: Secondary | ICD-10-CM | POA: Diagnosis not present

## 2021-04-25 DIAGNOSIS — C563 Malignant neoplasm of bilateral ovaries: Secondary | ICD-10-CM

## 2021-04-25 DIAGNOSIS — Z90710 Acquired absence of both cervix and uterus: Secondary | ICD-10-CM | POA: Diagnosis not present

## 2021-04-25 LAB — CA 125: Cancer Antigen (CA) 125: 58.3 U/mL — ABNORMAL HIGH (ref 0.0–38.1)

## 2021-04-25 MED ORDER — DEXTROSE 5 % IV SOLN: Freq: Once | INTRAVENOUS | Status: AC

## 2021-04-25 MED ORDER — HEPARIN SOD (PORK) LOCK FLUSH 100 UNIT/ML IV SOLN
500.0000 [IU] | Freq: Once | INTRAVENOUS | Status: AC | PRN
  Administered 2021-04-25: 500 [IU]

## 2021-04-25 MED ORDER — SODIUM CHLORIDE 0.9 % IV SOLN
10.0000 mg | Freq: Once | INTRAVENOUS | Status: AC
  Administered 2021-04-25: 10 mg via INTRAVENOUS
  Filled 2021-04-25: qty 10

## 2021-04-25 MED ORDER — DOXORUBICIN HCL LIPOSOMAL CHEMO INJECTION 2 MG/ML
30.0000 mg/m2 | Freq: Once | INTRAVENOUS | Status: AC
  Administered 2021-04-25: 50 mg via INTRAVENOUS
  Filled 2021-04-25: qty 25

## 2021-04-25 MED ORDER — DIPHENHYDRAMINE HCL 50 MG/ML IJ SOLN
50.0000 mg | Freq: Once | INTRAMUSCULAR | Status: AC
  Administered 2021-04-25: 50 mg via INTRAVENOUS
  Filled 2021-04-25: qty 1

## 2021-04-25 MED ORDER — FAMOTIDINE IN NACL 20-0.9 MG/50ML-% IV SOLN
20.0000 mg | Freq: Once | INTRAVENOUS | Status: AC
  Administered 2021-04-25: 20 mg via INTRAVENOUS
  Filled 2021-04-25: qty 50

## 2021-04-25 MED ORDER — PALONOSETRON HCL INJECTION 0.25 MG/5ML
0.2500 mg | Freq: Once | INTRAVENOUS | Status: AC
  Administered 2021-04-25: 0.25 mg via INTRAVENOUS
  Filled 2021-04-25: qty 5

## 2021-04-25 MED ORDER — SODIUM CHLORIDE 0.9 % IV SOLN
150.0000 mg | Freq: Once | INTRAVENOUS | Status: AC
  Administered 2021-04-25: 150 mg via INTRAVENOUS
  Filled 2021-04-25: qty 150

## 2021-04-25 MED ORDER — SODIUM CHLORIDE 0.9 % IV SOLN
364.5000 mg | Freq: Once | INTRAVENOUS | Status: AC
  Administered 2021-04-25: 360 mg via INTRAVENOUS
  Filled 2021-04-25: qty 36

## 2021-04-25 MED ORDER — SODIUM CHLORIDE 0.9% FLUSH
10.0000 mL | INTRAVENOUS | Status: DC | PRN
  Administered 2021-04-25: 10 mL

## 2021-04-25 NOTE — Progress Notes (Signed)
1442:PT STABLE AT TIME OF DISCHARGE ?

## 2021-04-25 NOTE — Patient Instructions (Signed)
[facility]  Discharge Instructions: ?Thank you for choosing [facility] to provide your oncology and hematology care.  ?If you have a lab appointment with the [facility], please go directly to the [facility] and check in at the registration area. ?  ?Wear comfortable clothing and clothing appropriate for easy access to any Portacath or PICC line.  ? ?We strive to give you quality time with your provider. You may need to reschedule your appointment if you arrive late (15 or more minutes).  Arriving late affects you and other patients whose appointments are after yours.  Also, if you miss three or more appointments without notifying the office, you may be dismissed from the clinic at the provider?s discretion.    ?  ?For prescription refill requests, have your pharmacy contact our office and allow 72 hours for refills to be completed.   ? ?Today you received the following chemotherapy and/or immunotherapy agents Carbolpatin,Liposomal Doxorubicin   ?  ?To help prevent nausea and vomiting after your treatment, we encourage you to take your nausea medication as directed. ? ?BELOW ARE SYMPTOMS THAT SHOULD BE REPORTED IMMEDIATELY: ?*FEVER GREATER THAN 100.4 F (38 ?C) OR HIGHER ?*CHILLS OR SWEATING ?*NAUSEA AND VOMITING THAT IS NOT CONTROLLED WITH YOUR NAUSEA MEDICATION ?*UNUSUAL SHORTNESS OF BREATH ?*UNUSUAL BRUISING OR BLEEDING ?*URINARY PROBLEMS (pain or burning when urinating, or frequent urination) ?*BOWEL PROBLEMS (unusual diarrhea, constipation, pain near the anus) ?TENDERNESS IN MOUTH AND THROAT WITH OR WITHOUT PRESENCE OF ULCERS (sore throat, sores in mouth, or a toothache) ?UNUSUAL RASH, SWELLING OR PAIN  ?UNUSUAL VAGINAL DISCHARGE OR ITCHING  ? ?Items with * indicate a potential emergency and should be followed up as soon as possible or go to the Emergency Department if any problems should occur. ? ?Please show the CHEMOTHERAPY ALERT CARD or IMMUNOTHERAPY ALERT CARD  at check-in to the Emergency Department and triage nurse. ? ?Should you have questions after your visit or need to cancel or reschedule your appointment, please contact [facility]  Dept: [phone]  and follow the prompts.  Office hours are 8:00 a.m. to 4:30 p.m. Monday - Friday. Please note that voicemails left after 4:00 p.m. may not be returned until the following business day.  We are closed weekends and major holidays. You have access to a nurse at all times for urgent questions. Please call the main number to the clinic Dept: [phone] and follow the prompts. ? ?For any non-urgent questions, you may also contact your provider using MyChart. We now offer e-Visits for anyone 78 and older to request care online for non-urgent symptoms. For details visit [email or web address]. ?  ?Also download the MyChart app! Go to the app store, search "MyChart", open the app, select [facility], and log in with your MyChart username and password. ? ?Due to Covid, a mask is required upon entering the hospital/clinic. If you do not have a mask, one will be given to you upon arrival. For doctor visits, patients may have 1 support person aged 52 or older with them. For treatment visits, patients cannot have anyone with them due to current Covid guidelines and our immunocompromised population.  ? ? ?

## 2021-04-26 ENCOUNTER — Encounter: Payer: Self-pay | Admitting: Oncology

## 2021-04-28 ENCOUNTER — Telehealth: Payer: Self-pay | Admitting: Oncology

## 2021-04-28 DIAGNOSIS — I10 Essential (primary) hypertension: Secondary | ICD-10-CM | POA: Diagnosis not present

## 2021-04-28 NOTE — Telephone Encounter (Signed)
04/28/21 Spoke with patients daughter and scheduled ct scans/lab and dv ?

## 2021-05-01 DIAGNOSIS — H47393 Other disorders of optic disc, bilateral: Secondary | ICD-10-CM | POA: Diagnosis not present

## 2021-05-01 DIAGNOSIS — H33321 Round hole, right eye: Secondary | ICD-10-CM | POA: Diagnosis not present

## 2021-05-01 DIAGNOSIS — H43811 Vitreous degeneration, right eye: Secondary | ICD-10-CM | POA: Diagnosis not present

## 2021-05-23 DIAGNOSIS — I10 Essential (primary) hypertension: Secondary | ICD-10-CM | POA: Diagnosis not present

## 2021-05-24 DIAGNOSIS — I10 Essential (primary) hypertension: Secondary | ICD-10-CM | POA: Diagnosis not present

## 2021-05-24 DIAGNOSIS — J9611 Chronic respiratory failure with hypoxia: Secondary | ICD-10-CM | POA: Diagnosis not present

## 2021-05-26 DIAGNOSIS — I700 Atherosclerosis of aorta: Secondary | ICD-10-CM | POA: Diagnosis not present

## 2021-05-26 DIAGNOSIS — C563 Malignant neoplasm of bilateral ovaries: Secondary | ICD-10-CM | POA: Diagnosis not present

## 2021-05-26 DIAGNOSIS — C569 Malignant neoplasm of unspecified ovary: Secondary | ICD-10-CM | POA: Diagnosis not present

## 2021-05-26 DIAGNOSIS — K8020 Calculus of gallbladder without cholecystitis without obstruction: Secondary | ICD-10-CM | POA: Diagnosis not present

## 2021-05-26 DIAGNOSIS — K5730 Diverticulosis of large intestine without perforation or abscess without bleeding: Secondary | ICD-10-CM | POA: Diagnosis not present

## 2021-05-26 NOTE — Progress Notes (Signed)
?[facility]  ?[address] ?[place],  [place]  [address] ?[phone] ? ?Clinic Day:  05/27/2021 ? ?Referring physician: Marco Collie, MD ? ? ?HISTORY OF PRESENT ILLNESS:  ?The patient is a 81 y.o. female with recurrent/metastatic ovarian cancer. She comes in today to go over her CT scans to ascertain her new disease baseline after 6 cycles of carboplatin/Doxil.  Since her last visit, the patient has been doing fine.  She tolerated her 6th cycle of chemotherapy well.  She denied having any abdominal pain or other symptoms which concern her for overt signs of disease progression.   ? ?With respect to her ovarian cancer, she initially presented with peritoneal disease and a metastatic nodule over her left abdominal wall.  She underwent 6 cycles of carboplatin/paclitaxel before undergoing a TAHBSO/maximum debulking surgery in August 2021.  The patient took olaparib maintenance therapy for numerous months until scans and a biopsy showed evidence of disease recurrence in October 2022.  This led to her being switched to carboplatin/Doxil for second line treatment. ? ?PHYSICAL EXAM:  ?Blood pressure (!) 172/75, pulse 72, temperature 98.3 ?F (36.8 ?C), resp. rate 14, height 5\' 2"  (1.575 m), weight 143 lb 12.8 oz (65.2 kg), SpO2 96 %. ?Wt Readings from Last 3 Encounters:  ?05/27/21 143 lb 12.8 oz (65.2 kg)  ?04/25/21 140 lb 12 oz (63.8 kg)  ?04/24/21 142 lb 8 oz (64.6 kg)  ? ?Body mass index is 26.3 kg/m?Marland Kitchen ?Performance status (ECOG): 1 ?Physical Exam ?Constitutional:   ?   General: She is not in acute distress. ?   Appearance: Normal appearance. She is normal weight.  ?HENT:  ?   Head: Normocephalic and atraumatic.  ?Eyes:  ?   General: No scleral icterus. ?   Extraocular Movements: Extraocular movements intact.  ?   Conjunctiva/sclera: Conjunctivae normal.  ?   Pupils: Pupils are equal, round, and reactive to light.  ?Cardiovascular:  ?   Rate and Rhythm: Normal rate and regular rhythm.  ?    Pulses: Normal pulses.  ?   Heart sounds: Normal heart sounds. No murmur heard. ?  No friction rub. No gallop.  ?Pulmonary:  ?   Effort: Pulmonary effort is normal. No respiratory distress.  ?   Breath sounds: Normal breath sounds.  ?Abdominal:  ?   General: Bowel sounds are normal. There is no distension.  ?   Palpations: Abdomen is soft. There is no hepatomegaly, splenomegaly or mass.  ?   Tenderness: There is no abdominal tenderness.  ?Musculoskeletal:     ?   General: Normal range of motion.  ?   Cervical back: Normal range of motion and neck supple.  ?   Right lower leg: No edema.  ?   Left lower leg: No edema.  ?Lymphadenopathy:  ?   Cervical: No cervical adenopathy.  ?Skin: ?   General: Skin is warm and dry.  ?   Comments:  ?  ?Neurological:  ?   General: No focal deficit present.  ?   Mental Status: She is alert and oriented to person, place, and time. Mental status is at baseline.  ?Psychiatric:     ?   Mood and Affect: Mood normal.     ?   Behavior: Behavior normal.     ?   Thought Content: Thought content normal.     ?   Judgment: Judgment normal.  ? ?SCANS:  CT scans of her abdomen/pelvis revealed the following: ?FINDINGS:  ?Lower  Chest: No acute findings.  ?Hepatobiliary: No hepatic masses identified. Multiple tiny calcified  ?gallstones are again seen, however there is no evidence of  ?cholecystitis or biliary ductal dilatation.  ?Pancreas: No mass or inflammatory changes.  ?Spleen: Within normal limits in size and appearance.  ?Adrenals/Urinary Tract: No masses identified. No evidence of  ?ureteral calculi or hydronephrosis.  ?Stomach/Bowel: No evidence of obstruction, inflammatory process or  ?abnormal fluid collections. Normal appendix visualized.  ?Diverticulosis is seen mainly involving the sigmoid colon, however  ?there is no evidence of diverticulitis.  ?Vascular/Lymphatic: No pathologically enlarged lymph nodes within  ?the abdomen. Mild bilateral external iliac lymphadenopathy  shows ?significant change, with largest lymph node on the right measuring  ?17 mm short axis. Mild right pelvic sidewall lymphadenopathy shows  ?mild increase since prior study, with 1 node measuring 13 mm short  ?axis on image 65/2 compared to 8 mm previously. Left inguinal  ?lymphadenopathy shows no significant change, with largest lymph node  ?measuring 14 mm short axis. A 13 mm right inguinal lymph node  ?remains stable. No acute vascular findings. Aortic atherosclerotic  ?calcification noted.  ?Reproductive: Prior hysterectomy noted. Several tiny omental soft  ?tissue nodules in the left anterior abdomen show no significant  ?change, largest measuring 11 x 11 mm on image 47/2. No evidence of  ?ascites.  ?Other: Soft tissue nodules in the left lower quadrant subcutaneous  ?fat are again seen, largest measuring 2.7 x 1.8 cm on image 62/2,  ?compared to 1.9 x 1.3 cm when remeasured on prior study.  ?Musculoskeletal: No suspicious bone lesions identified.  ? ?IMPRESSION:  ?Mild increase in right pelvic sidewall lymphadenopathy. Other pelvic  ?lymphadenopathy shows no significant change.  ?Mild increase in size of left lower quadrant subcutaneous soft  ?tissue nodule, consistent with metastatic disease.  ?Stable small omental soft tissue nodules, consistent with peritoneal  ?carcinomatosis. No evidence of ascites.  ?Cholelithiasis. No radiographic evidence of cholecystitis.  ?Colonic diverticulosis, without radiographic evidence of  ?diverticulitis.  ?Aortic Atherosclerosis (ICD10-I70.0).  ? ?LABS:   ? ? ? ? ?ASSESSMENT & PLAN:  ?Assessment/Plan:  A 81 y.o. female with recurrent/metastatic ovarian cancer.  In clinic today, I went over all of her CT scan images with her, for which she could see there has been no significant disease progression after her 6 cycles of carboplatin/Doxil.  However, her CA125 level is slightly higher today than what it has been.  Clinically, the patient has done very well.  When going over  her scans with her, I made sure she saw that she has no threatening lesions which have the potential to cause a bowel obstruction or other problems that could impact her daily quality of life.  Moving forward, I will send her tissue to [facility] to undergo testing to see if any form of targeted therapy can be used in the future.  I will see her back in 3 weeks to go over these results.  If testing shows that some form of targeted therapy can be used for her disease management, it will be employed.  If her results do not show where any form of targeted therapy can be used, as the patient is essentially asymptomatic from her ovarian cancer, I would consider something more palliative such as endocrine therapy for her future disease management.  The patient understands all the plans discussed today and is in agreement with them.   ? ?[facility] [place] Macarthur Critchley, MD ? ? ? ?  ?

## 2021-05-27 ENCOUNTER — Other Ambulatory Visit: Payer: Self-pay | Admitting: Oncology

## 2021-05-27 ENCOUNTER — Inpatient Hospital Stay: Payer: Medicare Other | Attending: Oncology | Admitting: Oncology

## 2021-05-27 VITALS — BP 172/75 | HR 72 | Temp 98.3°F | Resp 14 | Ht 62.0 in | Wt 143.8 lb

## 2021-05-27 DIAGNOSIS — C563 Malignant neoplasm of bilateral ovaries: Secondary | ICD-10-CM | POA: Diagnosis not present

## 2021-05-29 DIAGNOSIS — C569 Malignant neoplasm of unspecified ovary: Secondary | ICD-10-CM | POA: Diagnosis not present

## 2021-06-09 DIAGNOSIS — C569 Malignant neoplasm of unspecified ovary: Secondary | ICD-10-CM | POA: Diagnosis not present

## 2021-06-12 DIAGNOSIS — H33321 Round hole, right eye: Secondary | ICD-10-CM | POA: Diagnosis not present

## 2021-06-12 DIAGNOSIS — H43811 Vitreous degeneration, right eye: Secondary | ICD-10-CM | POA: Diagnosis not present

## 2021-06-12 DIAGNOSIS — H47393 Other disorders of optic disc, bilateral: Secondary | ICD-10-CM | POA: Diagnosis not present

## 2021-06-13 ENCOUNTER — Telehealth: Payer: Self-pay | Admitting: *Deleted

## 2021-06-13 ENCOUNTER — Telehealth: Payer: Self-pay | Admitting: Gynecologic Oncology

## 2021-06-13 NOTE — Telephone Encounter (Signed)
Spoke with pt's daughter [name] this morning who stated that her mom recently had a CT scan on 05/27/21 and it shows recurrence. Dr.Lewis will be starting treating but [name] was wondering if there is anything Dr.Tucker can do with treating the recurrence as well. Since Dr.Tucker performed the surgery she would like a 2nd opinion from Dr.Tucker. If Dr.Tucker thinks Dr.Lewis is fine to manage the recurrence then she's ok with that but if there is but if she thinks something different needs to be done then she would like to see Dr.Tucker. Informed her that we would let Dr.Tucker know and someone from the office will reach back out to her. She verbalized agreement.  ?

## 2021-06-13 NOTE — Telephone Encounter (Signed)
Return the patient's daughters call.  She has some questions about moving forward with treatment given recent CT findings.  Why cannot see the images, there seems to be some mild progression of areas that we have been following, suggesting progressive disease.  Additionally, her tumor marker has continued to slowly increase.  The daughter notes that her mom is overall spending more time in bed and seems more tired.  They will have follow-up soon with her medical oncologist, Dr. Bobby Rumpf, to discuss treatment options.  In the setting of recurrent disease, we reviewed that the goal is palliative not curative.  It is important to maintain quality of life even if we proceed with additional treatment.  It sounds like Dr. Bobby Rumpf is sending her tumor for foundation 1 testing.  I asked the daughter to reach out to me after their visit with Dr. Bobby Rumpf.  I am happy to help in any way that I can with regard to treatment decisions.  I also discussed that it would be very reasonable for the patient to elect not to pursue any cancer directed treatment. ? ?Jeral Pinch MD ?Gynecologic Oncology ? ?

## 2021-06-22 DIAGNOSIS — I10 Essential (primary) hypertension: Secondary | ICD-10-CM | POA: Diagnosis not present

## 2021-06-22 NOTE — Progress Notes (Signed)
?Res[facility]  ?[address] ?[place],  [address]  [address] ?[phone] ? ?Clinic Day:  05/27/2021 ? ?Referring physician: Marco Collie, MD ? ? ?HISTORY OF PRESENT ILLNESS:  ?The patient is a 81 y.o. female with recurrent/metastatic ovarian cancer. She comes in today to go over foundation 1 testing of her ovarian cancer tissue to determine if there is any form of targeted therapy that can be used for future management.  Recent CT scan showed disease progression after receiving 6 cycles of carboplatin/Doxil.  Since her last visit, the patient has been doing fairly well.  She denies having abdominal pain or other symptoms which concern her for overt signs of disease progression.  Her only complaint today is moderate fatigue. ? ?With respect to her ovarian cancer, she initially presented with peritoneal disease and a metastatic nodule over her left abdominal wall.  She underwent 6 cycles of carboplatin/paclitaxel before undergoing a TAHBSO/maximum debulking surgery in August 2021.  The patient took olaparib maintenance therapy for numerous months until scans and a biopsy showed evidence of disease recurrence in October 2022.  This led to her being switched to carboplatin/Doxil for second line treatment. ?Unfortunately, recent scans show disease progression on this regimen.   ? ?PHYSICAL EXAM:  ?Blood pressure (!) 179/73, pulse 82, temperature 98 ?F (36.7 ?C), resp. rate 20, height _0  (1.575 m), weight 147 lb 4.8 oz (66.8 kg), SpO2 97 %. ?Wt Readings from Last 3 Encounters:  ?06/23/21 147 lb 4.8 oz (66.8 kg)  ?05/27/21 143 lb 12.8 oz (65.2 kg)  ?04/25/21 140 lb 12 oz (63.8 kg)  ? ?Body mass index is 26.94 kg/m?Marland Kitchen ?Performance status (ECOG): 1 ?Physical Exam ?Constitutional:   ?   General: She is not in acute distress. ?   Appearance: Normal appearance. She is normal weight.  ?HENT:  ?   Head: Normocephalic and atraumatic.  ?Eyes:  ?   General: No scleral icterus. ?   Extraocular  Movements: Extraocular movements intact.  ?   Conjunctiva/sclera: Conjunctivae normal.  ?   Pupils: Pupils are equal, round, and reactive to light.  ?Cardiovascular:  ?   Rate and Rhythm: Normal rate and regular rhythm.  ?   Pulses: Normal pulses.  ?   Heart sounds: Normal heart sounds. No murmur heard. ?  No friction rub. No gallop.  ?Pulmonary:  ?   Effort: Pulmonary effort is normal. No respiratory distress.  ?   Breath sounds: Normal breath sounds.  ?Abdominal:  ?   General: Bowel sounds are normal. There is no distension.  ?   Palpations: Abdomen is soft. There is no hepatomegaly, splenomegaly or mass.  ?   Tenderness: There is no abdominal tenderness.  ?Musculoskeletal:     ?   General: Normal range of motion.  ?   Cervical back: Normal range of motion and neck supple.  ?   Right lower leg: No edema.  ?   Left lower leg: No edema.  ?Lymphadenopathy:  ?   Cervical: No cervical adenopathy.  ?Skin: ?   General: Skin is warm and dry.  ?   Comments:  ?  ?Neurological:  ?   General: No focal deficit present.  ?   Mental Status: She is alert and oriented to person, place, and time. Mental status is at baseline.  ?Psychiatric:     ?   Mood and Affect: Mood normal.     ?   Behavior: Behavior normal.     ?  Thought Content: Thought content normal.     ?   Judgment: Judgment normal.  ? ?PATHOLOGY: Foundation 1 testing unfortunately did not show any targets/mutations for which targeted therapy could be used to treat her disease.  Included in this was PD-L1 testing, whose TPS score came back at 0.  ? ?ASSESSMENT & PLAN:  ?Assessment/Plan:  A 81 y.o. female with recurrent/metastatic ovarian cancer.  In clinic today, I went over all of her Foundation One test results with her, which unfortunately did not give any indication that some form of targeted therapy could be considered for her disease management.  Based upon this and the fact that she has been on multiple lines of chemotherapy, I will now place her on endocrine  therapy with the hope that this less toxic intervention can lead to some form of disease stabilization.  I will place her on letrozole 2.5 mg, which she will take on a daily basis.  Of note, I will have her previous ovarian cancer tissue sent for folate receptor alpha testing.  If this level comes back elevated, she would be theoretically eligible to receive mirvetuximab soravtansine, which is a new antibody-drug conjugate used to treat ovarian cancer with high levels of this receptor.  Clinically, the patient appears to be doing fairly well.  I will see her back in 6 weeks for repeat clinical assessment.  The patient understands all the plans discussed today and is in agreement with them.   ? ?Megan Bidwell Macarthur Critchley, MD ? ? ? ?  ?

## 2021-06-23 ENCOUNTER — Inpatient Hospital Stay: Payer: Medicare Other | Attending: Oncology | Admitting: Oncology

## 2021-06-23 ENCOUNTER — Other Ambulatory Visit: Payer: Self-pay

## 2021-06-23 ENCOUNTER — Other Ambulatory Visit: Payer: Self-pay | Admitting: Oncology

## 2021-06-23 VITALS — BP 179/73 | HR 82 | Temp 98.0°F | Resp 20 | Ht 62.0 in | Wt 147.3 lb

## 2021-06-23 DIAGNOSIS — C563 Malignant neoplasm of bilateral ovaries: Secondary | ICD-10-CM

## 2021-06-23 DIAGNOSIS — I10 Essential (primary) hypertension: Secondary | ICD-10-CM | POA: Diagnosis not present

## 2021-06-23 DIAGNOSIS — J9611 Chronic respiratory failure with hypoxia: Secondary | ICD-10-CM | POA: Diagnosis not present

## 2021-06-23 DIAGNOSIS — H401132 Primary open-angle glaucoma, bilateral, moderate stage: Secondary | ICD-10-CM | POA: Diagnosis not present

## 2021-06-23 MED ORDER — LETROZOLE 2.5 MG PO TABS: 2.5000 mg | ORAL_TABLET | Freq: Every day | ORAL | 3 refills | Status: AC

## 2021-06-25 DIAGNOSIS — Z20822 Contact with and (suspected) exposure to covid-19: Secondary | ICD-10-CM | POA: Diagnosis not present

## 2021-07-03 DIAGNOSIS — C4441 Basal cell carcinoma of skin of scalp and neck: Secondary | ICD-10-CM | POA: Diagnosis not present

## 2021-07-03 DIAGNOSIS — L570 Actinic keratosis: Secondary | ICD-10-CM | POA: Diagnosis not present

## 2021-07-03 DIAGNOSIS — L578 Other skin changes due to chronic exposure to nonionizing radiation: Secondary | ICD-10-CM | POA: Diagnosis not present

## 2021-07-07 ENCOUNTER — Telehealth: Payer: Self-pay

## 2021-07-07 NOTE — Telephone Encounter (Signed)
Pt's daughter [name] called to follow up, per Dt. Tuckers request. Pt had appointment with Dr. Bobby Rumpf and [name] wanted to know if Dr. Berline Lopes had a chance to look at all results. Also, [name] is wanting advise from Dr.Tucker on wether everything is going as planned and is there anything extra she advises moving forward.  ?I advised [name] Dr. Berline Lopes is out of the office and I could send Melissa this message. [name] voiced an understanding and also said if needed she could wait until Dr. Berline Lopes gets back in the office next week.  ?

## 2021-07-10 ENCOUNTER — Ambulatory Visit (INDEPENDENT_AMBULATORY_CARE_PROVIDER_SITE_OTHER): Payer: Medicare Other | Admitting: Internal Medicine

## 2021-07-10 ENCOUNTER — Encounter: Payer: Self-pay | Admitting: Internal Medicine

## 2021-07-10 VITALS — BP 130/80 | HR 74 | Ht 62.0 in | Wt 148.0 lb

## 2021-07-10 DIAGNOSIS — E041 Nontoxic single thyroid nodule: Secondary | ICD-10-CM | POA: Diagnosis not present

## 2021-07-10 DIAGNOSIS — E0590 Thyrotoxicosis, unspecified without thyrotoxic crisis or storm: Secondary | ICD-10-CM | POA: Diagnosis not present

## 2021-07-10 NOTE — Patient Instructions (Signed)
Please stop at the lab.  Please continue methimazole 5 mg daily.  Please come back for a follow-up appointment with me in 1 year, but 6 months for labs.

## 2021-07-10 NOTE — Progress Notes (Signed)
Patient ID: Megan Waller, female   DOB: 1940/07/21, 81 y.o.   MRN: [id number]   This visit occurred during the SARS-CoV-2 public health emergency.  Safety protocols were in place, including screening questions prior to the visit, additional usage of staff PPE, and extensive cleaning of exam room while observing appropriate contact time as indicated for disinfecting solutions.   HPI  Megan Waller is a 81 y.o.-year-old female, initially referred by her PCP, Dr. Nyra Waller, returning for follow-up for thyrotoxicosis and a thyroid nodule.  She is here with her daughter who offers part of the history especially related to past medical history, current medications, and symptoms.  Last visit 6 months ago.  Interim history: Pt. has a history of stage IVb ovarian cancer with peritoneal disease and metastatic disease in the abdominal wall.  She had chemotherapy before TAH and BSO + debulking surgery in 09/2019 >> previously on [place]  However, she had a recurrence and she restarted a different chemotherapy regimen in 11/2020 (once a mo).  She was getting 10 mg of dexamethasone with the treatments. Now finished ChTx >> just started Femara. No palpitations, weight loss, heat intolerance.  She has chronic tremors.  Thyrotoxicosis: Reviewed history: Patient has a long history of thyrotoxicosis (>10 years, unclear if Graves' disease), for which she was on methimazole for many years.  However, she developed significant anemia (hemoglobin 6-7) in 11/2019, so Methimazole was stopped (11/2019) b/c possible bone marrow suppression.    However, she has a significant history of ovarian cancer stage IV for which she started [place] in 10/2019 and, in retrospect, this may have been the cause for the bone marrow suppression.  Therefore, she was started back on methimazole in 12/2019, when a TSH returned suppressed. She was advised to start at 5 mg daily but she actually started 10 mg daily. She was  referred to endocrinology for further evaluation.  She is currently on: - Methimazole 5 >> 2.5 mg daily in 06/2020 >> 5 mg daily in 01/2021 -  On Cardizem now.  I reviewed pt's thyroid tests: Lab Results  Component Value Date   TSH 1.09 02/10/2021   TSH 0.17 (L) 01/23/2021   TSH 3.81 10/09/2020   TSH 4.40 07/04/2020   TSH 2.20 03/01/2020   FREET4 0.70 02/10/2021   FREET4 0.73 01/23/2021   FREET4 0.59 (L) 10/09/2020   FREET4 0.63 07/04/2020   FREET4 0.73 03/01/2020   T3FREE 3.6 02/10/2021   T3FREE 3.8 01/23/2021   T3FREE 3.0 10/09/2020   T3FREE 2.6 07/04/2020   T3FREE 3.5 03/01/2020  08/28/2020: TSH 0.022 (0.45-4.5), free T4 1.42 (0.82-1.77)  02/13/2020: (on MMI): TSH 2.2, free T4 0.94 01/03/2020: (off MMI): TSH 0.026, free T4 2.10 (0.82-1.77), WBC 3.8 (3.4-10.8), RBC 3.69 (3.77-5.20) platelets 239 (150-450) - after a blood transfusion 07/11/2019: TSH 0.011 (0.45-4.5)  Antithyroid antibodies: Lab Results  Component Value Date   TSI <89 03/01/2020   Reviewed patient CBC: Lab Results  Component Value Date   WBC 4.3 04/24/2021   HGB 12.3 04/24/2021   HCT 37 04/24/2021   MCV 102 (A) 05/08/2020   PLT 176 04/24/2021   Of note, she also has a history of thyroid nodule for which she had a remote benign thyroid biopsy.  Reviewed available imaging test results: CT chest with contrast (03/17/2019):  03/15/2020: Received records from Dr. Nyra Waller.  However, there is no further information about previous thyroid disease beyond the fact that she has a distant, reportedly benign, thyroid biopsy.  Pt denies: - feeling nodules in neck - hoarseness - choking - SOB with lying down On dysphagia - occasionally.  Pt does not have a FH of thyroid ds.: sister with thyroid ds (? Type). No FH of thyroid cancer. No h/o radiation tx to head or neck.  No steroid use - prev. with ChTx (see above).  No herbal supplements. No Biotin use.  Pt. also has a history of COPD, paroxysmal A. fib,  generalized anxiety disorder, glaucoma, hyperlipidemia, HTN.  ROS: + see HPI  Past Medical History:  Diagnosis Date   Abdominal hernia    Acute on chronic respiratory failure with hypoxia ([place]) 03/25/2019   Anxiety    Anxiety    Atrial fibrillation ([place]) 03/24/2019   Chest pressure 09/18/2015   Community acquired bilateral lower lobe pneumonia 0/93/2355   Complication of anesthesia    COPD (chronic obstructive pulmonary disease) ([place]) 03/27/2019   Diverticulitis    Essential hypertension 09/18/2015   Family history of breast cancer    Family history of colon cancer    Family history of melanoma    Family history of prostate cancer    GAD (generalized anxiety disorder) 12/01/2015   Glaucoma    Hypercholesteremia    Hypertension    Hyperthyroidism    Hypokalemia 09/18/2015   Iron deficiency anemia, unspecified    Malignant neoplasm of ovary ([place])    Ovarian cancer ([place])    Parkinson disease ([place]) 12/01/2015   Primary insomnia 12/01/2015   Skin cancer    squamous cell   Weakness 09/18/2015   Past Surgical History:  Procedure Laterality Date   BREAST LUMPECTOMY     INCISIONAL HERNIA REPAIR N/A 08/15/2019   Procedure: HERNIA REPAIR INCISIONAL WITH MESH;  Surgeon: [name], MD;  Location: [facility];  Service: General;  Laterality: N/A;   LAPAROTOMY N/A 08/15/2019   Procedure: EXPLORATORY LAPAROTOMY;  Surgeon: [name], MD;  Location: [facility];  Service: General;  Laterality: N/A;  REQUESTING RNFA   LYSIS OF ADHESION N/A 08/15/2019   Procedure: LYSIS OF ADHESION;  Surgeon: [name], MD;  Location: [facility];  Service: General;  Laterality: N/A;   PARTIAL COLECTOMY     had partial colectomy for diverticular disease, required an ostomy followed by stoma revision   right hand surgery     squamous cell skin cancer resections     3   Social History   Socioeconomic History   Marital status: Widowed    Spouse name: Not on file   Number of children: 3   Years of  education: Not on file   Highest education level: Not on file  Occupational History   Not on file  Tobacco Use   Smoking status: Former   Smokeless tobacco: Never   Tobacco comments:    Quit 29 years ago  Vaping Use   Vaping Use: Never used  Substance and Sexual Activity   Alcohol use: Never   Drug use: Never   Sexual activity: Not on file  Other Topics Concern   Not on file  Social History Narrative   Not on file   Social Determinants of Health   Financial Resource Strain: Not on file  Food Insecurity: Not on file  Transportation Needs: Not on file  Physical Activity: Not on file  Stress: Not on file  Social Connections: Not on file  Intimate Partner Violence: Not on file   Current Outpatient Medications on File Prior to Visit  Medication Sig Dispense Refill  aspirin 81 MG EC tablet Take by mouth.     diltiazem (CARDIZEM CD) 240 MG 24 hr capsule Take 240 mg by mouth daily.     DULoxetine (CYMBALTA) 60 MG capsule Take 60 mg by mouth daily.     hydrochlorothiazide (HYDRODIURIL) 12.5 MG tablet Take 12.5 mg by mouth daily.     letrozole (FEMARA) 2.5 MG tablet Take 1 tablet (2.5 mg total) by mouth daily. 90 tablet 3   losartan (COZAAR) 100 MG tablet Take 100 mg by mouth daily.     methimazole (TAPAZOLE) 5 MG tablet Take 1 tablet (5 mg total) by mouth daily. 90 tablet 3   ondansetron (ZOFRAN) 4 MG tablet Take 1 tablet (4 mg total) by mouth every 4 (four) hours as needed for nausea. 90 tablet 3   potassium chloride SA (KLOR-CON M) 20 MEQ tablet Take 1 tablet (20 mEq total) by mouth 2 (two) times daily. 60 tablet 0   prochlorperazine (COMPAZINE) 10 MG tablet Take 1 tablet (10 mg total) by mouth every 6 (six) hours as needed for nausea or vomiting. 90 tablet 3   Travoprost, BAK Free, (TRAVATAN) 0.004 % SOLN ophthalmic solution Place 1 drop into both eyes at bedtime.      Current Facility-Administered Medications on File Prior to Visit  Medication Dose Route Frequency Provider  Last Rate Last Admin   0.9 %  sodium chloride infusion (Manually program via Guardrails IV Fluids)  250 mL Intravenous Once [place] Scrape A, NP       acetaminophen (TYLENOL) tablet 650 mg  650 mg Oral Once [place] Scrape A, NP       diphenhydrAMINE (BENADRYL) capsule 25 mg  25 mg Oral Once [place] Scrape A, NP       heparin lock flush 100 unit/mL  500 Units Intracatheter Daily PRN [place] Scrape A, NP       heparin lock flush 100 unit/mL  250 Units Intracatheter PRN [place] Scrape A, NP       sodium chloride flush (NS) 0.9 % injection 10 mL  10 mL Intracatheter PRN [place] Scrape A, NP       sodium chloride flush (NS) 0.9 % injection 3 mL  3 mL Intracatheter PRN Melodye Ped, NP       Allergies  Allergen Reactions   Ezetimibe-Simvastatin Other (See Comments)    Aching   Lipitor [Atorvastatin]     Achy   Family History  Problem Relation Age of Onset   Stroke Mother    Heart attack Father    Skin cancer Father        non-melanoma; dx. in his late 53s   Breast cancer Sister 69   Prostate cancer Brother 29       metastatic   Colon cancer Brother 58   Melanoma Brother    Breast cancer Sister 14   Breast cancer Niece 84   Colon cancer Paternal Aunt        dx. in her 88s or 36s   Cancer Paternal Uncle        mole on back?, dx. in his 76s   Breast cancer Other 11       paternal great-aunt (grandmother's sister)   Melanoma Brother 26   Colon cancer Paternal Aunt        dx. older than 46   Throat cancer Paternal Aunt        dx. in her 69s   Breast cancer Cousin  dx. in her 33s or 22s (maternal first cousin)   Breast cancer Cousin        dx. in her 46s or 63s (maternal first cousin)   Breast cancer Cousin        dx. in her 27s (maternal first cousin)   PE: BP 130/80 (BP Location: Right Arm, Patient Position: Sitting, Cuff Size: Normal)   Pulse 74   Ht 5\' 2"  (1.575 m)   Wt 148 lb (67.1 kg)   SpO2 95%   BMI 27.07 kg/m  Wt Readings from Last 3  Encounters:  07/10/21 148 lb (67.1 kg)  06/23/21 147 lb 4.8 oz (66.8 kg)  05/27/21 143 lb 12.8 oz (65.2 kg)   Constitutional: normal weight, in NAD Eyes: PERRLA, EOMI, no exophthalmos, no lid lag, no stare ENT: moist mucous membranes, no thyromegaly, no thyroid nodules felt in her neck, no thyroid bruits, no cervical lymphadenopathy Cardiovascular: RRR, No MRG Respiratory: CTA B Musculoskeletal: + deformities (finger missing right hand), strength intact in all 4 Skin: moist, warm, no rashes Neurological: mild tremor with outstretched hands, DTR normal in all 4  ASSESSMENT: 1. Thyrotoxicosis  2. Thyroid nodule  PLAN:  1. Patient with history of a low TSH, without thyrotoxic symptoms: No weight loss, heat intolerance, hyperdefecation, palpitations, but with anxiety and loose stools before restarting methimazole.  After starting methimazole, she is essentially asymptomatic.  She was previously on Coreg 25 mg twice a day, now on Cardizem. -She did not appear to have exogenous causes for the low TSH and the most likely diagnosis appears to be Graves' disease, although her TSI antibodies were not elevated.  Thyroiditis or toxic multinodular goiter were less likely. -We discussed about options for treatment.  She is not a candidate for RAI treatment or surgery due to her advanced ovarian cancer with myelosuppression.  There was a concern in the past that methimazole was addressing her bone marrow, however, this was found to be related to [place] (known to cause a myelodysplastic picture).  Indeed, after decreasing the dose of Lynparza, her hemoglobin recovered. She was then able to come off Lynparza completely as she had to restart chemotherapy in the setting of cancer recurrence.  As of now, she finished chemotherapy and started on Femara. -After last visit, we increased the dose to 5 mg daily. -At today's visit, we will check her TFTs and adjust the methimazole dose accordingly -I will see her  back in 1 year, but in 6 months for labs  2. Thyroid Nodule -Per review of her CT scan from 02/2019, she has a large, 2.5 x 3.4 cm thyroid nodule, with calcifications.  Reportedly, this nodule was biopsied with benign results in the past. -No neck compression symptoms, except for occasional dysphagia, and I do not feel masses on palpation of her neck today -Due to her advanced cancer, will follow her clinically.  Patient and her daughter wholeheartedly agree.  Component     Latest Ref Rng 07/10/2021  TSH     0.35 - 5.50 uIU/mL 3.59   T4,Free(Direct)     0.60 - 1.60 ng/dL 0.67   Triiodothyronine,Free,Serum     2.3 - 4.2 pg/mL 2.9   Thyroid tests are all normal.  We will continue the same dose of methimazole for now.  We will have her back in 6 months for labs.  Philemon Kingdom, MD PhD [facility] Endocrinology

## 2021-07-11 LAB — TSH: TSH: 3.59 u[IU]/mL (ref 0.35–5.50)

## 2021-07-11 LAB — T3, FREE: T3, Free: 2.9 pg/mL (ref 2.3–4.2)

## 2021-07-11 LAB — T4, FREE: Free T4: 0.67 ng/dL (ref 0.60–1.60)

## 2021-07-15 ENCOUNTER — Telehealth: Payer: Self-pay | Admitting: *Deleted

## 2021-07-15 NOTE — Telephone Encounter (Signed)
I'm happy to have a phone visit with her in a few weeks if she'd prefer

## 2021-07-15 NOTE — Telephone Encounter (Signed)
Spoke with pt's daughter to let her know that Dr.Tucker would be happy to have a phone visit with her in a few weeks if she prefers that. She stated pt gets her CA 125 drawn soon so she will call when that results for a phone visit. Dr.Tucker notified.

## 2021-07-15 NOTE — Telephone Encounter (Signed)
Spoke with [name] this morning to inform her that Dr.Tucker has reviewed Dr.Lewis note. The letrozole plan is reasonable and she recommends moving forward with it. Pt's daughter asked if Dr.Tucker feels like she needs to have an appt with pt or if she feels that pt being treated by Dr.Lewis alone is enough because they did not return for an appt with Dr.Tucker in September? Informed pt that per Dr.Tucker's last note we can make an appt with Dr.Tucker even if she missed it last fall. Daughter still wanted [place] to ask Dr.Tucker if it's necessary or if she thinks the treatment plan with Dr.Lewis is sufficient. Provider informed.

## 2021-07-17 ENCOUNTER — Encounter: Payer: Self-pay | Admitting: Oncology

## 2021-07-23 DIAGNOSIS — I10 Essential (primary) hypertension: Secondary | ICD-10-CM | POA: Diagnosis not present

## 2021-07-24 DIAGNOSIS — I10 Essential (primary) hypertension: Secondary | ICD-10-CM | POA: Diagnosis not present

## 2021-07-24 DIAGNOSIS — J9611 Chronic respiratory failure with hypoxia: Secondary | ICD-10-CM | POA: Diagnosis not present

## 2021-08-01 ENCOUNTER — Other Ambulatory Visit: Payer: Self-pay

## 2021-08-01 ENCOUNTER — Inpatient Hospital Stay: Payer: Medicare Other | Attending: Oncology

## 2021-08-01 DIAGNOSIS — C569 Malignant neoplasm of unspecified ovary: Secondary | ICD-10-CM | POA: Diagnosis not present

## 2021-08-01 DIAGNOSIS — C792 Secondary malignant neoplasm of skin: Secondary | ICD-10-CM | POA: Diagnosis not present

## 2021-08-01 DIAGNOSIS — Z90722 Acquired absence of ovaries, bilateral: Secondary | ICD-10-CM | POA: Insufficient documentation

## 2021-08-01 DIAGNOSIS — C563 Malignant neoplasm of bilateral ovaries: Secondary | ICD-10-CM

## 2021-08-01 DIAGNOSIS — Z90710 Acquired absence of both cervix and uterus: Secondary | ICD-10-CM | POA: Diagnosis not present

## 2021-08-01 DIAGNOSIS — Z9079 Acquired absence of other genital organ(s): Secondary | ICD-10-CM | POA: Diagnosis not present

## 2021-08-01 LAB — CBC: RBC: 4.26 (ref 3.87–5.11)

## 2021-08-01 LAB — CBC AND DIFFERENTIAL
HCT: 40 (ref 36–46)
Hemoglobin: 13 (ref 12.0–16.0)
Neutrophils Absolute: 2.48
Platelets: 157 10*3/uL (ref 150–400)
WBC: 5.4

## 2021-08-02 LAB — CA 125: Cancer Antigen (CA) 125: 314 U/mL — ABNORMAL HIGH (ref 0.0–38.1)

## 2021-08-04 ENCOUNTER — Inpatient Hospital Stay (INDEPENDENT_AMBULATORY_CARE_PROVIDER_SITE_OTHER): Payer: Medicare Other | Admitting: Oncology

## 2021-08-04 ENCOUNTER — Other Ambulatory Visit: Payer: Self-pay | Admitting: Oncology

## 2021-08-04 VITALS — BP 179/81 | HR 74 | Temp 97.9°F | Resp 16 | Ht 62.0 in | Wt 151.4 lb

## 2021-08-04 DIAGNOSIS — C563 Malignant neoplasm of bilateral ovaries: Secondary | ICD-10-CM

## 2021-08-04 NOTE — Progress Notes (Signed)
[facility]  [address] [place],  [place]  [address] [phone]  Clinic Day:  05/27/2021  Referring physician: Marco Collie, MD  HISTORY OF PRESENT ILLNESS:  The patient is a 81 y.o. female with recurrent/metastatic ovarian cancer. She comes in today for routine follow-up.  Over the past 6 weeks, the patient has been receiving letrozole for palliative disease management.  The patient claims to be tolerating this agent very well.  She also denies having any obvious symptoms/findings which concern her for continued disease progression.    With respect to her ovarian cancer, she initially presented with peritoneal disease and a metastatic nodule over her left abdominal wall.  She underwent 6 cycles of carboplatin/paclitaxel before undergoing a TAHBSO/maximum debulking surgery in August 2021.  The patient took olaparib maintenance therapy for numerous months until scans and a biopsy showed evidence of disease recurrence in October 2022.  This led to her being switched to carboplatin/Doxil for second line treatment. Unfortunately, recent scans show disease progression on this regimen.  This led to her being placed on letrozole for possible disease control/palliation.  PHYSICAL EXAM:  There were no vitals taken for this visit. Wt Readings from Last 3 Encounters:  07/10/21 148 lb (67.1 kg)  06/23/21 147 lb 4.8 oz (66.8 kg)  05/27/21 143 lb 12.8 oz (65.2 kg)   There is no height or weight on file to calculate BMI. Performance status (ECOG): 1 Physical Exam Constitutional:      General: She is not in acute distress.    Appearance: Normal appearance. She is normal weight.  HENT:     Head: Normocephalic and atraumatic.  Eyes:     General: No scleral icterus.    Extraocular Movements: Extraocular movements intact.     Conjunctiva/sclera: Conjunctivae normal.     Pupils: Pupils are equal, round, and reactive to light.  Cardiovascular:     Rate and Rhythm:  Normal rate and regular rhythm.     Pulses: Normal pulses.     Heart sounds: Normal heart sounds. No murmur heard.    No friction rub. No gallop.  Pulmonary:     Effort: Pulmonary effort is normal. No respiratory distress.     Breath sounds: Normal breath sounds.  Abdominal:     General: Bowel sounds are normal. There is no distension.     Palpations: Abdomen is soft. There is no hepatomegaly, splenomegaly or mass.     Tenderness: There is no abdominal tenderness.  Musculoskeletal:        General: Normal range of motion.     Cervical back: Normal range of motion and neck supple.     Right lower leg: No edema.     Left lower leg: No edema.  Lymphadenopathy:     Cervical: No cervical adenopathy.  Skin:    General: Skin is warm and dry.     Comments:    Neurological:     General: No focal deficit present.     Mental Status: She is alert and oriented to person, place, and time. Mental status is at baseline.  Psychiatric:        Mood and Affect: Mood normal.        Behavior: Behavior normal.        Thought Content: Thought content normal.        Judgment: Judgment normal.    LABS:    Latest Reference Range & Units Most Recent 03/27/21 16:15 04/24/21 16:20  Cancer Antigen (CA)  125 0.0 - 38.1 U/mL 314.0 (H) 08/01/21 16:09 43.8 (H) 58.3 (H)  (H): Data is abnormally high  ASSESSMENT & PLAN:  Assessment/Plan:  A 81 y.o. female with recurrent/metastatic ovarian cancer.  Although the patient is clinically doing well, I am concerned that her CA125 level has abruptly risen while on letrozole.  This definitely has to be concerned for disease progression.  Based upon this, I will have her undergo CT scans of her abdomen/pelvis later this week for radiographic assessment.  I will see her back in 1 week to go over her CT scan images and their implications.  Of note, I am still awaiting her folate receptor-alpha testing.  If this level comes back elevated, she would be theoretically eligible to  receive mirvetuximab soravtansine, which is a new antibody-drug conjugate used to treat ovarian cancer with high levels of this receptor.  The patient understands all the plans discussed today and is in agreement with them.    Renny Gunnarson Macarthur Critchley, MD

## 2021-08-07 DIAGNOSIS — K8000 Calculus of gallbladder with acute cholecystitis without obstruction: Secondary | ICD-10-CM | POA: Diagnosis not present

## 2021-08-07 DIAGNOSIS — I700 Atherosclerosis of aorta: Secondary | ICD-10-CM | POA: Diagnosis not present

## 2021-08-07 DIAGNOSIS — C563 Malignant neoplasm of bilateral ovaries: Secondary | ICD-10-CM | POA: Diagnosis not present

## 2021-08-10 NOTE — Progress Notes (Signed)
[facility]  [address] [place],  [place]  [address] [phone]  Clinic Day:  05/27/2021  Referring physician: Marco Collie, MD  HISTORY OF PRESENT ILLNESS:  The patient is a 81 y.o. female with recurrent/metastatic ovarian cancer. She comes in today to go over her CT scans as a recent CA125 level came back very elevated.  This occurred while she was taking letrozole over the past 6 weeks. Despite the rise in her CA125 level, she denies having any new symptoms/findings which concern her for obvious disease progression.    With respect to her ovarian cancer, she initially presented with peritoneal disease and a metastatic nodule over her left abdominal wall.  She underwent 6 cycles of carboplatin/paclitaxel before undergoing a TAHBSO/maximum debulking surgery in August 2021.  The patient took olaparib maintenance therapy for numerous months until scans and a biopsy showed evidence of disease recurrence in October 2022.  This led to her being switched to carboplatin/Doxil for second line treatment.  Unfortunately, recent scans showed disease progression while on this regimen.  This led to her being placed on letrozole for possible disease control/palliation.  PHYSICAL EXAM:  Blood pressure (!) 189/85, pulse 79, temperature 98.3 F (36.8 C), resp. rate 16, height 5\' 2"  (1.575 m), weight 151 lb 6.4 oz (68.7 kg), SpO2 95 %. Wt Readings from Last 3 Encounters:  08/11/21 151 lb 6.4 oz (68.7 kg)  08/04/21 151 lb 6.4 oz (68.7 kg)  07/10/21 148 lb (67.1 kg)   Body mass index is 27.69 kg/m. Performance status (ECOG): 1 Physical Exam Constitutional:      General: She is not in acute distress.    Appearance: Normal appearance. She is normal weight.  HENT:     Head: Normocephalic and atraumatic.  Eyes:     General: No scleral icterus.    Extraocular Movements: Extraocular movements intact.     Conjunctiva/sclera: Conjunctivae normal.     Pupils: Pupils are  equal, round, and reactive to light.  Cardiovascular:     Rate and Rhythm: Normal rate and regular rhythm.     Pulses: Normal pulses.     Heart sounds: Normal heart sounds. No murmur heard.    No friction rub. No gallop.  Pulmonary:     Effort: Pulmonary effort is normal. No respiratory distress.     Breath sounds: Normal breath sounds.  Abdominal:     General: Bowel sounds are normal. There is no distension.     Palpations: Abdomen is soft. There is no hepatomegaly, splenomegaly or mass.     Tenderness: There is no abdominal tenderness.  Musculoskeletal:        General: Normal range of motion.     Cervical back: Normal range of motion and neck supple.     Right lower leg: No edema.     Left lower leg: No edema.  Lymphadenopathy:     Cervical: No cervical adenopathy.  Skin:    General: Skin is warm and dry.     Comments:    Neurological:     General: No focal deficit present.     Mental Status: She is alert and oriented to person, place, and time. Mental status is at baseline.  Psychiatric:        Mood and Affect: Mood normal.        Behavior: Behavior normal.        Thought Content: Thought content normal.        Judgment: Judgment normal.  SCANS: CT scans of her abdomen/pelvis revealed the following: FINDINGS: Lower Chest: No acute findings.  Hepatobiliary: No hepatic masses identified. Multiple tiny calcified gallstones again noted, however there is no evidence of cholecystitis or biliary ductal dilatation.  Pancreas: No mass or inflammatory changes.  Spleen: Within normal limits in size and appearance.  Adrenals/Urinary Tract: No masses identified. No evidence of ureteral calculi or hydronephrosis.  Stomach/Bowel: No evidence of obstruction, inflammatory process or abnormal fluid collections. Normal appendix visualized.  Vascular/Lymphatic: Mild gastrohepatic ligament lymphadenopathy is new since prior study, largest lymph node measuring 13 mm on image 14/2.  Increased lymphadenopathy is seen in the external iliac chains, bilateral pelvic sidewalls, and bilateral inguinal regions. Index lymph node in the left inguinal region measures 2.3 cm short axis compared to 1.4 cm previously. Index lymph node in right external iliac chain measures 2.0 cm on image 61/2, compared to 1.7 cm previously. No acute vascular findings. Aortic atherosclerotic calcification incidentally noted.  Reproductive: Prior hysterectomy again noted.  Other: New mass seen in the left anterior abdominal omental fat measuring 2.3 x 2.1 cm on image 36/2, increased from 1.1 x 1.1 cm previously. New soft tissue nodules are seen in the central small bowel mesentery measuring up to 11 mm. New peritoneal soft tissue density in the left pelvis measuring 2.1 x 1.9 cm on image 53/2. Mild increase in size of soft tissue masses in the midline and left anterior abdominal wall soft tissues. Index lesion measures 3.1 x 2.3 cm on image 56/2, compared to 2.7 x 1.8 cm previously.  Musculoskeletal: No suspicious bone lesions identified.  IMPRESSION: Increased abdominal and pelvic lymphadenopathy, consistent with metastatic disease.  Increased omental and peritoneal soft tissue nodules, consistent with peritoneal carcinomatosis. No evidence of ascites.  Mildly increased metastatic disease in the anterior abdominal wall soft tissues.  Cholelithiasis. No radiographic evidence of cholecystitis.  Aortic Atherosclerosis (ICD10-I70.0).  LABS:    Latest Reference Range & Units Most Recent 03/27/21 16:15 04/24/21 16:20  Cancer Antigen (CA) 125 0.0 - 38.1 U/mL 314.0 (H) 08/01/21 16:09 43.8 (H) 58.3 (H)  (H): Data is abnormally high  ASSESSMENT & PLAN:  Assessment/Plan:  A 81 y.o. female with recurrent/metastatic ovarian cancer.  In clinic today, I went over all of her CT scan images with her, for which she could unfortunately see she does have evidence of disease progression.  Fortunately,  there appears to be obvious evidence of organ involvement.  Nevertheless, as she does have disease progression within her peritoneum and pelvic lymph nodes, I did talk to her about another line of therapy.  I did discuss single-agent gemcitabine with her.  She would take this agent weekly, every 2/3 weeks.  Another possible agent would be topotecan.  Of note, her folate receptor alpha level came back at 45%.  In order for this test to be considered high enough for mirvetuximab soravtansine use, it had to be at least 75%.  As it stands now, she does not qualify for being able to use this medication.  The patient did inquire about seeking a second opinion in [place], for which I have no problem.  I will arrange for her to see Dr Berline Lopes or one of her colleagues to see if they have other potential treatment options, including clinical trials, for which the patient may be eligible.  I will tentatively see this patient back in 4 weeks.  I may see her back sooner if no other treatment options appear more promising. The patient and her  daughter understand all the plans discussed today and are in agreement with them.    Merinda Victorino Macarthur Critchley, MD

## 2021-08-11 ENCOUNTER — Inpatient Hospital Stay (INDEPENDENT_AMBULATORY_CARE_PROVIDER_SITE_OTHER): Payer: Medicare Other | Admitting: Oncology

## 2021-08-11 ENCOUNTER — Encounter (HOSPITAL_COMMUNITY): Payer: Self-pay | Admitting: Oncology

## 2021-08-11 ENCOUNTER — Encounter: Payer: Self-pay | Admitting: Oncology

## 2021-08-11 VITALS — BP 189/85 | HR 79 | Temp 98.3°F | Resp 16 | Ht 62.0 in | Wt 151.4 lb

## 2021-08-11 DIAGNOSIS — C563 Malignant neoplasm of bilateral ovaries: Secondary | ICD-10-CM | POA: Diagnosis not present

## 2021-08-12 LAB — SURGICAL PATHOLOGY

## 2021-08-15 ENCOUNTER — Encounter: Payer: Self-pay | Admitting: Oncology

## 2021-08-15 ENCOUNTER — Telehealth: Payer: Self-pay

## 2021-08-20 ENCOUNTER — Encounter: Payer: Self-pay | Admitting: Gynecologic Oncology

## 2021-08-22 DIAGNOSIS — I10 Essential (primary) hypertension: Secondary | ICD-10-CM | POA: Diagnosis not present

## 2021-08-23 DIAGNOSIS — C569 Malignant neoplasm of unspecified ovary: Secondary | ICD-10-CM | POA: Diagnosis not present

## 2021-08-23 DIAGNOSIS — I10 Essential (primary) hypertension: Secondary | ICD-10-CM | POA: Diagnosis not present

## 2021-08-24 NOTE — Progress Notes (Unsigned)
Gynecologic Oncology Return Clinic Visit  08/25/21  Reason for Visit: Surveillance in the setting of advanced ovarian cancer  Treatment History: Oncology History Overview Note  Patient presented in December with an abdominal nodule over her left medial abdomen that grew progressively over time.  She was seen by dermatology for this lesion and underwent a biopsy.  Ca-125: 1/13: 180 2/10: 96.9 3/16: 12.1 4/5: 9.7 12/9: 8.4   Ovarian cancer ([place])  02/14/2019 Initial Biopsy   Left lower abdominal skin biopsy, metastatic adenocarcinoma.  Based on histologic and IHC findings, a gynecologic primary is favored.   02/14/2019 Initial Diagnosis   Ovarian cancer ([place])   03/06/2019 Imaging   CT chest, abdomen, pelvis: Widespread peritoneal carcinomatosis with bilateral adnexal masses, bilateral external iliac adenopathy and a subcutaneous mass in the left periumbilical region, likely a soft tissue metastasis.  No evidence of thoracic metastatic disease.  Enlarging complex cystic and solid left thyroid nodule.  Large recurrent left anterior abdominal wall hernia containing portion of the transverse colon without evidence of incarceration or obstruction.  Cholelithiasis and aortic atherosclerosis.   03/08/2019 Cancer Staging   Staging form: Ovary, Fallopian Tube, and Primary Peritoneal Carcinoma, AJCC 8th Edition - Clinical stage from 03/08/2019: FIGO Stage IVB (cT1b, cN1, cM1b) - Signed by Marice Potter, MD on 10/19/2020 Histopathologic type: Adenocarcinoma, NOS   03/15/2019 -  Chemotherapy   Carboplatin/taxol Cycle 1: 1/20 Cycle 2: 2/23 (delayed secondary to side effects), 25% dose reduction Cycle 3: 3/16  Cycle 5: 4/29; continued Neulasta to prevent severe neutropenia from delaying cycles of tx.     03/17/2019 Imaging   CT chest: No pulmonary embolus noted.  Small pleural effusions with bibasilar atelectasis.  No adenopathy.  Dominant left lobe thyroid mass, stable from prior CT.    05/29/2019 Imaging   CT abdomen and pelvis: Interval reduction in size of bilateral ovarian masses, varies now measuring within normal limits (2 x 1.4 cm on the right and 1.7 x 1.4 cm on the left), essentially complete resolution of previously seen extensive metastatic omental and peritoneal nodularity about the abdomen and pelvis.  Resolution of bilateral inguinal lymphadenopathy. Parastomal hernia of the left lower quadrant containing nonobstructed loops of transverse colon and mid small bowel.   07/20/2019 Imaging   CT of the abdomen and pelvis shows stable 11 mm well-circumscribed left adrenal lesion in the body of the left adrenal.  Small cyst in the lower pole of the right kidney.  Signs of prior bowel resection of the small bowel and colon with anastomotic sites.  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  No adenopathy in the retroperitoneum or upper abdomen.  Right active adnexa measures 2 x 1.4 cm compared to 1.9 x 1.4 cm on last imaging.  Left adnexa measures 1.6 x 1.6 cm.  Uterus grossly normal on CT.  No pelvic adenopathy.  Ventral suprapubic hernia containing small bowel.  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   08/15/2019 Surgery   Robotic-assisted laparoscopic total hysterectomy with bilateral salpingoophorectomy, infracolic omentectomy Lysis of adhesions and hernia reduction (Dr. Rosendo Gros), repair of abdominal wall hernia (Dr. Rosendo Gros)  On EUA, small mobile uterus. On intra-abdominal entry, normal appearing liver edge, diaphragm, and stomach. Omentum adherent to the anterior abdominal wall along patient's prior midline incision. Transverse colon hernia through prior ostomy site. Otherwise, some  adhesions of the sigmoid to the left sidewall. Uterus 6cm and normal appearing. Right adnexa normal in appearance. Left  adnexa with adhesions to the sigmoid colon and adherent to the medial aspect of the broad ligament. Left ovary with evidence of treatment effect. Several small (<81m) nodules on the peritoneum within the cul de sac c/w treated tumor implants (cauterized). Omentum without obvious evidence of tumor burden.   08/15/2019 Pathology Results   A. UTERUS, BILATERAL TUBES AND OVARIES, HYSTERECTOMY AND  BILATERALSALPINGO- OOPHORECTOMY:   Ovaries, bilateral:  -  Residual carcinoma status post neoadjuvant therapy involving  bilateral ovaries  -  See oncology table and comment below   Fallopian tubes, bilateral:  -  Benign fallopian tubes  -  No malignancy identified   Uterus:  -  Benign endometrial polyp  -  Leiomyoma (1.1 cm)  -  Serosa with focal calcifications  -  No hyperplasia or malignancy identified   Cervix:  -  Benign cervix with nabothian cysts  -  No dysplasia or malignancy identified   B. OMENTUM, RESECTION:  -  Microscopic focus of metastatic adenocarcinoma (< 129m   ONCOLOGY TABLE:   OVARY or FALLOPIAN TUBE or PRIMARY PERITONEUM:   Procedure: Hysterectomy, bilateral salpingo-oophorectomy and omentectomy  Specimen Integrity: Intact  Tumor Site: Ovary  Ovarian Surface Involvement: Present  Fallopian Tube Surface Involvement: Not identified  Tumor Size: 0.8 cm  Histologic Type: Carcinoma, subtype cannot be determined; See comment  Histologic Grade: High-grade; see comment  Implants: Not identified  Other Tissue/ Organ Involvement: Not identified  Largest Extrapelvic Peritoneal Focus: < 1 mm  Peritoneal/Ascitic Fluid: Not applicable  Treatment Effect: Marked response with no or minimal residual cancer  (CRS 3)  Regional Lymph Nodes: No lymph nodes submitted or found  Pathologic Stage Classification (pTNM, AJCC 8th Edition): ypT3a, ypNX  Representative Tumor Block: A9  Comment(s): This case is being staged as if it were a high-grade serous  carcinoma.  Given that the  morphologic and histologic assessment is  being rendered status post neoadjuvant therapy, the diagnostic features  of serous carcinoma are not readily apparent.  In fact, there are  scattered well-formed glandular elements within the foci of tumor. There  are other areas with high-grade nuclei and scattered psammomatous  calcifications.  Residual tumor is present in both the right and left  ovaries with surface involvement.  The tumor foci in the right ovary  measures greater than 2 mm in maximum size.  However, the focus of  metastatic carcinoma in the omentum shows only a single focus which  measures less than 1 mm.  Based on this assessment, CRS 3 is assigned to  this case.    10/25/2019 Genetic Testing   Negative genetic testing:  No pathogenic variants detected on the [facility] CancerNext + RNAinsight panel. The report date is 10/25/2019.   The CancerNext gene panel offered by [employer] sequencing and rearrangement analysis for the following 36 genes:   APC*, ATM*, AXIN2, BARD1, BMPR1A, BRCA1*, BRCA2*, BRIP1*, CDH1*, CDK4, CDKN2A, CHEK2*, DICER1, HOXB13, EPCAM, GREM1, MLH1*, MSH2*, MSH3, MSH6*, MUTYH*, NBN, NF1*, NTHL1, PALB2*, PMS2*, POLD1, POLE, PTEN*, RAD51C*, RAD51D*, RECQL, SMAD4, SMARCA4, STK11, and TP53*. DNA and RNA analyses performed for * genes.   12/06/2020 - 04/25/2021 Chemotherapy   Patient is on Treatment Plan : OVARIAN RECURRENT Liposomal Doxorubicin + Carboplatin q28d X 6 Cycles      Olaparib after completion of adjuvant therapy. Dose decreased to 45039md due to heme toxicity.  Developed  skin nodule in 10/2020. Biopsy revealed metastatic adenocarcinoma c/w her ovarian cancer.   Patient was started on carboplatin/doxil in 11/2020. CT A/P after 3 cycles: 1. Overall minimal response to therapy as evidenced by similar to minimally decreased size of omental, pelvic nodal and subcutaneous  left pelvic wall metastases.  2. Liver appears steatotic.  3. Cholelithiasis. 4.  Aortic atherosclerosis (ICD10-I70.0).  Completed cycle 6 of carbo/doxil on 04/24/21.     Latest Reference Range & Units 02/01/20 15:48 04/08/20 15:54 05/08/20 15:59 10/10/20 15:50 01/02/21 15:48 01/30/21 15:50 03/27/21 16:15 04/24/21 16:20  Cancer Antigen (CA) 125 0.0 - 38.1 U/mL 8.4 9.9 11.1 23.8 39.0 (H) 36.7 43.8 (H) 58.3 (H   CT A/P after completion of 6 cycles: Mild increase in right pelvic sidewall lymphadenopathy. Other pelvic lymphadenopathy shows no significant change.  Mild increase in size of left lower quadrant subcutaneous soft tissue nodule, consistent with metastatic disease.  Stable small omental soft tissue nodules, consistent with peritoneal carcinomatosis. No evidence of ascites.   06/09/21: Foundation one testing showed TMB low, MSS. No actionable mutations.  06/2021: Started endocrine therapy with letrozole 2.71m q d. FRalpha testing was performed - 45%, no eligible for mirvetuximab.  CT A/P 07/2021: Increased abdominal and pelvic lymphadenopathy, consistent with metastatic disease. Increased omental and peritoneal soft tissue nodules, consistent with peritoneal carcinomatosis. No evidence of ascites. Mildly increased metastatic disease in the anterior abdominal wall soft tissues.   Latest Reference Range & Units Most Recent 03/27/21 16:15 04/24/21 16:20  Cancer Antigen (CA) 125 0.0 - 38.1 U/mL 314.0 (H) 08/01/21 16:09 43.8 (H) 58.3 (H)    Interval History: Patient reports overall doing well.  She is having intermittent worsening of bilateral swelling in her lower extremities since April.  Notes overall moving more slowly but still getting around without much difficulty.  Had some soreness in her left lower quadrant, denies any abdominal or pelvic pain.  Is using MiraLAX most days with daily normal bowel function.  Has some increased urinary frequency due to taking her blood pressure medications, otherwise denies urinary symptoms.  Endorses a good appetite without nausea or  emesis.  Recently stopped the letrozole after CT scan on 6/14 showed some disease progression.  Patient comes in with her daughter today who is planning to retire from work to be able to spend more time with her at home in October.  Past Medical/Surgical History: Past Medical History:  Diagnosis Date   Abdominal hernia    Acute on chronic respiratory failure with hypoxia ([place] 03/25/2019   Anxiety    Anxiety    Atrial fibrillation ([place] 03/24/2019   Chest pressure 09/18/2015   Community acquired bilateral lower lobe pneumonia 11/61/0960  Complication of anesthesia    COPD (chronic obstructive pulmonary disease) ([place] 03/27/2019   Diverticulitis    Essential hypertension 09/18/2015   Family history of breast cancer    Family history of colon cancer    Family history of melanoma    Family history of prostate cancer    GAD (generalized anxiety disorder) 12/01/2015   Glaucoma    Hypercholesteremia    Hypertension    Hyperthyroidism    Hypokalemia 09/18/2015   Iron deficiency anemia, unspecified    Malignant neoplasm of ovary ([place]    [place]varian cancer ([place]    Parkinson disease ([place] 12/01/2015   Primary insomnia 12/01/2015   Skin cancer    squamous cell   Weakness 09/18/2015    Past Surgical History:  Procedure Laterality Date  BREAST LUMPECTOMY     INCISIONAL HERNIA REPAIR N/A 08/15/2019   Procedure: HERNIA REPAIR INCISIONAL WITH MESH;  Surgeon: [name], MD;  Location: [facility];  Service: General;  Laterality: N/A;   LAPAROTOMY N/A 08/15/2019   Procedure: EXPLORATORY LAPAROTOMY;  Surgeon: [name], MD;  Location: [facility];  Service: General;  Laterality: N/A;  REQUESTING RNFA   LYSIS OF ADHESION N/A 08/15/2019   Procedure: LYSIS OF ADHESION;  Surgeon: [name], MD;  Location: [facility];  Service: General;  Laterality: N/A;   PARTIAL COLECTOMY     had partial colectomy for diverticular disease, required an ostomy followed by stoma revision   right hand surgery      squamous cell skin cancer resections     3    Family History  Problem Relation Age of Onset   Stroke Mother    Heart attack Father    Skin cancer Father        non-melanoma; dx. in his late 45s   Breast cancer Sister 56   Prostate cancer Brother 54       metastatic   Colon cancer Brother 34   Melanoma Brother    Breast cancer Sister 66   Breast cancer Niece 82   Colon cancer Paternal Aunt        dx. in her 68s or 25s   Cancer Paternal Uncle        mole on back?, dx. in his 68s   Breast cancer Other 33       paternal great-aunt (grandmother's sister)   Melanoma Brother 42   Colon cancer Paternal Aunt        dx. older than 59   Throat cancer Paternal Aunt        dx. in her 57s   Breast cancer Cousin        dx. in her 74s or 15s (maternal first cousin)   Breast cancer Cousin        dx. in her 39s or 53s (maternal first cousin)   Breast cancer Cousin        dx. in her 65s (maternal first cousin)    Social History   Socioeconomic History   Marital status: Widowed    Spouse name: Not on file   Number of children: 3   Years of education: Not on file   Highest education level: Not on file  Occupational History   Not on file  Tobacco Use   Smoking status: Former   Smokeless tobacco: Never   Tobacco comments:    Quit 29 years ago  Vaping Use   Vaping Use: Never used  Substance and Sexual Activity   Alcohol use: Never   Drug use: Never   Sexual activity: Not Currently  Other Topics Concern   Not on file  Social History Narrative   Not on file   Social Determinants of Health   Financial Resource Strain: Not on file  Food Insecurity: Not on file  Transportation Needs: Not on file  Physical Activity: Not on file  Stress: Not on file  Social Connections: Not on file    Current Medications:  Current Outpatient Medications:    aspirin 81 MG EC tablet, Take by mouth., Disp: , Rfl:    diltiazem (CARDIZEM CD) 240 MG 24 hr capsule, Take 240 mg by mouth daily.,  Disp: , Rfl:    DULoxetine (CYMBALTA) 60 MG capsule, Take 60 mg by mouth daily., Disp: , Rfl:    hydrochlorothiazide (HYDRODIURIL)  12.5 MG tablet, Take 12.5 mg by mouth daily., Disp: , Rfl:    letrozole (FEMARA) 2.5 MG tablet, Take 1 tablet (2.5 mg total) by mouth daily., Disp: 90 tablet, Rfl: 3   losartan (COZAAR) 100 MG tablet, Take 100 mg by mouth daily., Disp: , Rfl:    ondansetron (ZOFRAN) 4 MG tablet, Take 1 tablet (4 mg total) by mouth every 4 (four) hours as needed for nausea., Disp: 90 tablet, Rfl: 3 No current facility-administered medications for this visit.  Facility-Administered Medications Ordered in Other Visits:    0.9 %  sodium chloride infusion (Manually program via Guardrails IV Fluids), 250 mL, Intravenous, Once, [place] Scrape A, NP   acetaminophen (TYLENOL) tablet 650 mg, 650 mg, Oral, Once, Ronne Binning, Melissa A, NP   diphenhydrAMINE (BENADRYL) capsule 25 mg, 25 mg, Oral, Once, Ronne Binning, Melissa A, NP   heparin lock flush 100 unit/mL, 500 Units, Intracatheter, Daily PRN, [place] Scrape A, NP   heparin lock flush 100 unit/mL, 250 Units, Intracatheter, PRN, Ronne Binning, Melissa A, NP   sodium chloride flush (NS) 0.9 % injection 10 mL, 10 mL, Intracatheter, PRN, Ronne Binning, Melissa A, NP   sodium chloride flush (NS) 0.9 % injection 3 mL, 3 mL, Intracatheter, PRN, [place] Scrape A, NP  Review of Systems: + LE edema Denies appetite changes, fevers, chills, fatigue, unexplained weight changes. Denies hearing loss, neck lumps or masses, mouth sores, ringing in ears or voice changes. Denies cough or wheezing.  Denies shortness of breath. Denies chest pain or palpitations.  Denies abdominal distention, pain, blood in stools, constipation, diarrhea, nausea, vomiting, or early satiety. Denies pain with intercourse, dysuria, frequency, hematuria or incontinence. Denies hot flashes, pelvic pain, vaginal bleeding or vaginal discharge.   Denies joint pain, back pain or muscle  pain/cramps. Denies itching, rash, or wounds. Denies dizziness, headaches, numbness or seizures. Denies swollen lymph nodes or glands, denies easy bruising or bleeding. Denies anxiety, depression, confusion, or decreased concentration.  Physical Exam: BP (!) 172/74 (BP Location: Left Arm, Patient Position: Sitting)   Pulse 72   Temp 98.9 F (37.2 C) (Tympanic)   Resp 16   Ht '5\' 2"'  (1.575 m)   Wt 150 lb 12.8 oz (68.4 kg)   SpO2 97%   BMI 27.58 kg/m  General: Alert, oriented, no acute distress. HEENT: Normocephalic, atraumatic, sclera anicteric. Chest: Unlabored breathing on room air n. Extremities: Grossly normal range of motion.  Warm, well perfused.  1+ edema bilaterally.  Laboratory & Radiologic Studies: None new  Assessment & Plan: Megan Waller is a 81 y.o. woman with recurrent platinum-resistant ovarian cancer who presents for treatment discussion.  Patient presents with her daughter today.  She is overall doing quite well and is relatively asymptomatic with regards to her recurrent ovarian cancer.  Most recently, she has been on hormone therapy.  CT scan in mid June showed increased burden of metastatic disease.  From a treatment strategy standpoint, we discussed multiple options including clinical trial enrollment, systemic therapy, and moved towards focus on symptom management rather than cancer directed care.  There is 1 clinical trial at [facility] in the setting of platinum resistant ovarian cancer and several other trials in solid tumors for which the patient may be a candidate.  We discussed the advantages and disadvantages of clinical trial enrollment.  The patient voices hesitation about being on a trial with something that does not have known added benefit in the setting of ovarian cancer.  I offered that I could have the  research team at [facility] see if the patient was eligible for any trials.  At this point, she is not interested in clinical trial enrollment.  We also  discussed the idea of systemic therapy.  She is spoken with her medical oncologist about single agent gemcitabine.  There are multiple single agent and doublet regimens, including some oral chemotherapy.  With cytotoxic chemotherapy and the platinum resistant setting, for most treatment options, we could expect about a 20% response.  The patient has had folate receptor alpha testing (negative by test criteria) genomic testing was negative for any targeted therapy.  We also discussed the option now, or in the future, of moving towards symptom-based management.  This would mean deferring any cancer directed care.  Spent some time today discussing hospice and the benefits of setting up hospice early.  The patient voices a desire to remain in her home if this is possible.  Her daughter is planning to stop working in several months to be her caretaker.  At this time, the patient is undecided whether she would like to pursue cytotoxic chemotherapy.  I answered all of the patient's questions as well as her daughters today.  I offered that we could have a phone visit within the next 1-2 weeks to answer any additional questions that may come up as they discussed options.  40 minutes of total time was spent for this patient encounter, including preparation, face-to-face counseling with the patient and coordination of care, and documentation of the encounter.  Jeral Pinch, MD  Division of Gynecologic Oncology  Department of Obstetrics and Gynecology  [facility]

## 2021-08-25 ENCOUNTER — Inpatient Hospital Stay: Payer: Medicare Other | Attending: Oncology | Admitting: Gynecologic Oncology

## 2021-08-25 ENCOUNTER — Other Ambulatory Visit: Payer: Self-pay

## 2021-08-25 VITALS — BP 172/74 | HR 72 | Temp 98.9°F | Resp 16 | Ht 62.0 in | Wt 150.8 lb

## 2021-08-25 DIAGNOSIS — Z79899 Other long term (current) drug therapy: Secondary | ICD-10-CM | POA: Diagnosis not present

## 2021-08-25 DIAGNOSIS — C7989 Secondary malignant neoplasm of other specified sites: Secondary | ICD-10-CM | POA: Insufficient documentation

## 2021-08-25 DIAGNOSIS — Z90710 Acquired absence of both cervix and uterus: Secondary | ICD-10-CM | POA: Diagnosis not present

## 2021-08-25 DIAGNOSIS — C775 Secondary and unspecified malignant neoplasm of intrapelvic lymph nodes: Secondary | ICD-10-CM | POA: Insufficient documentation

## 2021-08-25 DIAGNOSIS — C786 Secondary malignant neoplasm of retroperitoneum and peritoneum: Secondary | ICD-10-CM | POA: Insufficient documentation

## 2021-08-25 DIAGNOSIS — Z79811 Long term (current) use of aromatase inhibitors: Secondary | ICD-10-CM | POA: Diagnosis not present

## 2021-08-25 DIAGNOSIS — C569 Malignant neoplasm of unspecified ovary: Secondary | ICD-10-CM | POA: Diagnosis not present

## 2021-08-25 DIAGNOSIS — Z90722 Acquired absence of ovaries, bilateral: Secondary | ICD-10-CM | POA: Insufficient documentation

## 2021-08-25 DIAGNOSIS — C563 Malignant neoplasm of bilateral ovaries: Secondary | ICD-10-CM

## 2021-08-25 DIAGNOSIS — Z9221 Personal history of antineoplastic chemotherapy: Secondary | ICD-10-CM | POA: Insufficient documentation

## 2021-08-25 DIAGNOSIS — C792 Secondary malignant neoplasm of skin: Secondary | ICD-10-CM | POA: Diagnosis not present

## 2021-08-25 NOTE — Patient Instructions (Signed)
It was good to see you both today.   We will have a phone visit in a couple of weeks after you've had some time to think about the decision of pursuing additional cancer treatment.   Please call if you need anything.

## 2021-08-29 DIAGNOSIS — R600 Localized edema: Secondary | ICD-10-CM | POA: Diagnosis not present

## 2021-08-29 DIAGNOSIS — Z6827 Body mass index (BMI) 27.0-27.9, adult: Secondary | ICD-10-CM | POA: Diagnosis not present

## 2021-08-29 DIAGNOSIS — C569 Malignant neoplasm of unspecified ovary: Secondary | ICD-10-CM | POA: Diagnosis not present

## 2021-09-08 ENCOUNTER — Encounter: Payer: Self-pay | Admitting: Gynecologic Oncology

## 2021-09-08 ENCOUNTER — Inpatient Hospital Stay (HOSPITAL_BASED_OUTPATIENT_CLINIC_OR_DEPARTMENT_OTHER): Payer: Medicare Other | Admitting: Gynecologic Oncology

## 2021-09-08 ENCOUNTER — Telehealth: Payer: Self-pay | Admitting: *Deleted

## 2021-09-08 DIAGNOSIS — C563 Malignant neoplasm of bilateral ovaries: Secondary | ICD-10-CM | POA: Diagnosis not present

## 2021-09-08 NOTE — Telephone Encounter (Signed)
Spoke with pt's daughter to let her know that Dr.Tucker is still in surgery but will be giving them a call when she is finished. She verbalized understanding.

## 2021-09-08 NOTE — Progress Notes (Signed)
Gynecologic Oncology Telehealth Note: Gyn-Onc  I connected with Megan Waller on 09/08/21 at  4:00 PM EDT by telephone and verified that I am speaking with the correct person using two identifiers.  I discussed the limitations, risks, security and privacy concerns of performing an evaluation and management service by telemedicine and the availability of in-person appointments. I also discussed with the patient that there may be a patient responsible charge related to this service. The patient expressed understanding and agreed to proceed.  Other persons participating in the visit and their role in the encounter: patient's daughter, Megan Waller.  Patient's location: home Provider's location: [facility]  Reason for Visit: follow-up recent visit  Treatment History: Oncology History Overview Note  Patient presented in December with an abdominal nodule over her left medial abdomen that grew progressively over time.  She was seen by dermatology for this lesion and underwent a biopsy.  Ca-125: 1/13: 180 2/10: 96.9 3/16: 12.1 4/5: 9.7 12/9: 8.4   Ovarian cancer ([place])  02/14/2019 Initial Biopsy   Left lower abdominal skin biopsy, metastatic adenocarcinoma.  Based on histologic and IHC findings, a gynecologic primary is favored.   02/14/2019 Initial Diagnosis   Ovarian cancer ([place])   03/06/2019 Imaging   CT chest, abdomen, pelvis: Widespread peritoneal carcinomatosis with bilateral adnexal masses, bilateral external iliac adenopathy and a subcutaneous mass in the left periumbilical region, likely a soft tissue metastasis.  No evidence of thoracic metastatic disease.  Enlarging complex cystic and solid left thyroid nodule.  Large recurrent left anterior abdominal wall hernia containing portion of the transverse colon without evidence of incarceration or obstruction.  Cholelithiasis and aortic atherosclerosis.   03/08/2019 Cancer Staging   Staging form: Ovary, Fallopian Tube, and Primary  Peritoneal Carcinoma, AJCC 8th Edition - Clinical stage from 03/08/2019: FIGO Stage IVB (cT1b, cN1, cM1b) - Signed by Marice Potter, MD on 10/19/2020 Histopathologic type: Adenocarcinoma, NOS   03/15/2019 -  Chemotherapy   Carboplatin/taxol Cycle 1: 1/20 Cycle 2: 2/23 (delayed secondary to side effects), 25% dose reduction Cycle 3: 3/16  Cycle 5: 4/29; continued Neulasta to prevent severe neutropenia from delaying cycles of tx.     03/17/2019 Imaging   CT chest: No pulmonary embolus noted.  Small pleural effusions with bibasilar atelectasis.  No adenopathy.  Dominant left lobe thyroid mass, stable from prior CT.   05/29/2019 Imaging   CT abdomen and pelvis: Interval reduction in size of bilateral ovarian masses, varies now measuring within normal limits (2 x 1.4 cm on the right and 1.7 x 1.4 cm on the left), essentially complete resolution of previously seen extensive metastatic omental and peritoneal nodularity about the abdomen and pelvis.  Resolution of bilateral inguinal lymphadenopathy. Parastomal hernia of the left lower quadrant containing nonobstructed loops of transverse colon and mid small bowel.   07/20/2019 Imaging   CT of the abdomen and pelvis shows stable 11 mm well-circumscribed left adrenal lesion in the body of the left adrenal.  Small cyst in the lower pole of the right kidney.  Signs of prior bowel resection of the small bowel and colon with anastomotic sites.  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  No adenopathy in the retroperitoneum or upper abdomen.  Right active adnexa measures 2 x 1.4 cm compared to 1.9 x 1.4 cm on last imaging.  Left adnexa measures 1.6 x 1.6 cm.  Uterus grossly normal on CT.  No pelvic adenopathy.  Ventral suprapubic hernia containing small bowel.  Hernia at the site of previous stoma colostomy with herniation of the colon through the defect with  similar appearance.   08/15/2019 Surgery   Robotic-assisted laparoscopic total hysterectomy with bilateral salpingoophorectomy, infracolic omentectomy Lysis of adhesions and hernia reduction (Dr. Rosendo Gros), repair of abdominal wall hernia (Dr. Rosendo Gros)  On EUA, small mobile uterus. On intra-abdominal entry, normal appearing liver edge, diaphragm, and stomach. Omentum adherent to the anterior abdominal wall along patient's prior midline incision. Transverse colon hernia through prior ostomy site. Otherwise, some adhesions of the sigmoid to the left sidewall. Uterus 6cm and normal appearing. Right adnexa normal in appearance. Left adnexa with adhesions to the sigmoid colon and adherent to the medial aspect of the broad ligament. Left ovary with evidence of treatment effect. Several small (<79m) nodules on the peritoneum within the cul de sac c/w treated tumor implants (cauterized). Omentum without obvious evidence of tumor burden.   08/15/2019 Pathology Results   A. UTERUS, BILATERAL TUBES AND OVARIES, HYSTERECTOMY AND  BILATERALSALPINGO- OOPHORECTOMY:   Ovaries, bilateral:  -  Residual carcinoma status post neoadjuvant therapy involving  bilateral ovaries  -  See oncology table and comment below   Fallopian tubes, bilateral:  -  Benign fallopian tubes  -  No malignancy identified   Uterus:  -  Benign endometrial polyp  -  Leiomyoma (1.1 cm)  -  Serosa with focal calcifications  -  No hyperplasia or malignancy identified   Cervix:  -  Benign cervix with nabothian cysts  -  No dysplasia or malignancy identified   B. OMENTUM, RESECTION:  -  Microscopic focus of metastatic adenocarcinoma (< 149m   ONCOLOGY TABLE:   OVARY or FALLOPIAN TUBE or PRIMARY PERITONEUM:   Procedure: Hysterectomy, bilateral salpingo-oophorectomy and omentectomy  Specimen Integrity: Intact  Tumor Site: Ovary  Ovarian Surface Involvement: Present  Fallopian Tube Surface Involvement: Not identified  Tumor Size:  0.8 cm  Histologic Type: Carcinoma, subtype cannot be determined; See comment  Histologic Grade: High-grade; see comment  Implants: Not identified  Other Tissue/ Organ Involvement: Not identified  Largest Extrapelvic Peritoneal Focus: < 1 mm  Peritoneal/Ascitic Fluid: Not applicable  Treatment Effect: Marked response with no or minimal residual cancer  (CRS 3)  Regional Lymph Nodes: No lymph nodes submitted or found  Pathologic Stage Classification (pTNM, AJCC 8th Edition): ypT3a, ypNX  Representative Tumor Block: A9  Comment(s): This case is being staged as if it were a high-grade serous  carcinoma.  Given that the morphologic and histologic assessment is  being rendered status post neoadjuvant therapy, the diagnostic features  of serous carcinoma are not readily apparent.  In fact, there are  scattered well-formed glandular elements within the foci of tumor. There  are other areas with high-grade nuclei and scattered psammomatous  calcifications.  Residual tumor is present in both the right and left  ovaries with surface involvement.  The tumor foci in the right ovary  measures greater than 2 mm in maximum size.  However, the focus of  metastatic carcinoma in the omentum shows only a single focus which  measures less than 1 mm.  Based on this assessment, CRS 3 is assigned to  this case.    10/25/2019 Genetic Testing   Negative genetic testing:  No pathogenic variants detected on the [facility] CancerNext + RNAinsight panel. The report date is 10/25/2019.   The CancerNext gene panel offered by [facility] sequencing and rearrangement analysis for  the following 36 genes:   APC*, ATM*, AXIN2, BARD1, BMPR1A, BRCA1*, BRCA2*, BRIP1*, CDH1*, CDK4, CDKN2A, CHEK2*, DICER1, HOXB13, EPCAM, GREM1, MLH1*, MSH2*, MSH3, MSH6*, MUTYH*, NBN, NF1*, NTHL1, PALB2*, PMS2*, POLD1, POLE, PTEN*, RAD51C*, RAD51D*, RECQL, SMAD4, SMARCA4, STK11, and TP53*. DNA and RNA analyses performed for * genes.    12/06/2020 - 04/25/2021 Chemotherapy   Patient is on Treatment Plan : OVARIAN RECURRENT Liposomal Doxorubicin + Carboplatin q28d X 6 Cycles       Interval History: Doing well. No significant change since last visit.   Past Medical/Surgical History: Past Medical History:  Diagnosis Date   Abdominal hernia    Acute on chronic respiratory failure with hypoxia ([place]) 03/25/2019   Anxiety    Anxiety    Atrial fibrillation ([place]) 03/24/2019   Chest pressure 09/18/2015   Community acquired bilateral lower lobe pneumonia 3/81/8299   Complication of anesthesia    COPD (chronic obstructive pulmonary disease) ([place]) 03/27/2019   Diverticulitis    Essential hypertension 09/18/2015   Family history of breast cancer    Family history of colon cancer    Family history of melanoma    Family history of prostate cancer    GAD (generalized anxiety disorder) 12/01/2015   Glaucoma    Hypercholesteremia    Hypertension    Hyperthyroidism    Hypokalemia 09/18/2015   Iron deficiency anemia, unspecified    Malignant neoplasm of ovary ([place])    Ovarian cancer ([place])    Parkinson disease ([place]) 12/01/2015   Primary insomnia 12/01/2015   Skin cancer    squamous cell   Weakness 09/18/2015    Past Surgical History:  Procedure Laterality Date   BREAST LUMPECTOMY     INCISIONAL HERNIA REPAIR N/A 08/15/2019   Procedure: HERNIA REPAIR INCISIONAL WITH MESH;  Surgeon: [name], MD;  Location: [facility];  Service: General;  Laterality: N/A;   LAPAROTOMY N/A 08/15/2019   Procedure: EXPLORATORY LAPAROTOMY;  Surgeon: [name], MD;  Location: [facility];  Service: General;  Laterality: N/A;  REQUESTING RNFA   LYSIS OF ADHESION N/A 08/15/2019   Procedure: LYSIS OF ADHESION;  Surgeon: [name], MD;  Location: [facility];  Service: General;  Laterality: N/A;   PARTIAL COLECTOMY     had partial colectomy for diverticular disease, required an ostomy followed by stoma revision   right hand surgery     squamous cell  skin cancer resections     3    Family History  Problem Relation Age of Onset   Stroke Mother    Heart attack Father    Skin cancer Father        non-melanoma; dx. in his late 37s   Breast cancer Sister 77   Prostate cancer Brother 71       metastatic   Colon cancer Brother 18   Melanoma Brother    Breast cancer Sister 63   Breast cancer Niece 5   Colon cancer Paternal Aunt        dx. in her 3s or 57s   Cancer Paternal Uncle        mole on back?, dx. in his 24s   Breast cancer Other 74       paternal great-aunt (grandmother's sister)   Melanoma Brother 91   Colon cancer Paternal Aunt        dx. older than 45   Throat cancer Paternal Aunt        dx. in her 85s   Breast cancer Cousin  dx. in her 47s or 18s (maternal first cousin)   Breast cancer Cousin        dx. in her 65s or 31s (maternal first cousin)   Breast cancer Cousin        dx. in her 54s (maternal first cousin)    Social History   Socioeconomic History   Marital status: Widowed    Spouse name: Not on file   Number of children: 3   Years of education: Not on file   Highest education level: Not on file  Occupational History   Not on file  Tobacco Use   Smoking status: Former   Smokeless tobacco: Never   Tobacco comments:    Quit 29 years ago  Vaping Use   Vaping Use: Never used  Substance and Sexual Activity   Alcohol use: Never   Drug use: Never   Sexual activity: Not Currently  Other Topics Concern   Not on file  Social History Narrative   Not on file   Social Determinants of Health   Financial Resource Strain: Not on file  Food Insecurity: Not on file  Transportation Needs: Not on file  Physical Activity: Not on file  Stress: Not on file  Social Connections: Not on file    Current Medications:  Current Outpatient Medications:    aspirin 81 MG EC tablet, Take by mouth., Disp: , Rfl:    diltiazem (CARDIZEM CD) 240 MG 24 hr capsule, Take 240 mg by mouth daily., Disp: , Rfl:     DULoxetine (CYMBALTA) 60 MG capsule, Take 60 mg by mouth daily., Disp: , Rfl:    hydrochlorothiazide (HYDRODIURIL) 12.5 MG tablet, Take 12.5 mg by mouth daily., Disp: , Rfl:    letrozole (FEMARA) 2.5 MG tablet, Take 1 tablet (2.5 mg total) by mouth daily., Disp: 90 tablet, Rfl: 3   losartan (COZAAR) 100 MG tablet, Take 100 mg by mouth daily., Disp: , Rfl:    ondansetron (ZOFRAN) 4 MG tablet, Take 1 tablet (4 mg total) by mouth every 4 (four) hours as needed for nausea., Disp: 90 tablet, Rfl: 3 No current facility-administered medications for this visit.  Facility-Administered Medications Ordered in Other Visits:    0.9 %  sodium chloride infusion (Manually program via Guardrails IV Fluids), 250 mL, Intravenous, Once, [place] Scrape A, NP   acetaminophen (TYLENOL) tablet 650 mg, 650 mg, Oral, Once, Ronne Binning, Melissa A, NP   diphenhydrAMINE (BENADRYL) capsule 25 mg, 25 mg, Oral, Once, Ronne Binning, Melissa A, NP   heparin lock flush 100 unit/mL, 500 Units, Intracatheter, Daily PRN, [place] Scrape A, NP   heparin lock flush 100 unit/mL, 250 Units, Intracatheter, PRN, Ronne Binning, Melissa A, NP   sodium chloride flush (NS) 0.9 % injection 10 mL, 10 mL, Intracatheter, PRN, Ronne Binning, Melissa A, NP   sodium chloride flush (NS) 0.9 % injection 3 mL, 3 mL, Intracatheter, PRN, [place] Scrape A, NP  Physical Exam: There were no vitals taken for this visit. Deferred given limitations of phone visit  Laboratory & Radiologic Studies: None new  Assessment & Plan: Megan Waller is a 81 y.o. woman with recurrent platinum-resistant ovarian cancer.  Spoke with Megan Waller, patient present during our discussion. After having some time to think about options, she is most interested in not pursuing further cancer-directed care. They see Dr. Bobby Rumpf on Thursday. I have asked them to think about Hospice. They will let me know after they meet with Dr. Bobby Rumpf whether they are ready for a referral to hospice (  although  this can be made by Dr. Bobby Rumpf also). We discussed possible follow-up (such as a phone visit q 6-8 weeks to check-in).   I discussed the assessment and treatment plan with the patient. The patient was provided with an opportunity to ask questions and all were answered. The patient agreed with the plan and demonstrated an understanding of the instructions.   The patient was advised to call back or see an in-person evaluation if the symptoms worsen or if the condition fails to improve as anticipated.   8 minutes of total time was spent for this patient encounter, including preparation, face-to-face counseling with the patient and coordination of care, and documentation of the encounter.   Jeral Pinch, MD  Division of Gynecologic Oncology  Department of Obstetrics and Gynecology  [facility]

## 2021-09-10 NOTE — Progress Notes (Signed)
[place]  [address] [place],    [id number] [phone]  Clinic Day:  09/09/2021  Referring physician: Marco Collie, MD  HISTORY OF PRESENT ILLNESS:  The patient is a 81 y.o. female with recurrent/metastatic ovarian cancer.  She comes in today to discuss what she wishes to consider next for her disease management.  Of note, her ovarian cancer has progressed on multiple lines of treatment.  She recently saw gynecology-oncology in [place] who talked to her about considering either a clinical trial or another line of chemotherapy.  However, they were candid with her in stating that she would likely have no better than a 20% response rate with a next line of chemotherapy.  After hearing all of this, the patient has ultimately decided that she is not interested in pursuing further treatment.  Although she has noticed increased stiffness, her overall daily quality of life remains more than adequate.  With respect to her ovarian cancer, she initially presented with peritoneal disease and a metastatic nodule over her left abdominal wall.  She underwent 6 cycles of carboplatin/paclitaxel before undergoing a TAHBSO/maximum debulking surgery in August 2021.  The patient took olaparib maintenance therapy for numerous months until scans and a biopsy showed evidence of disease recurrence in October 2022.  This led to her being switched to carboplatin/Doxil for second line treatment.  Unfortunately, recent scans showed disease progression while on this regimen.  This led to her being placed on letrozole for possible disease control/palliation.  PHYSICAL EXAM:  Blood pressure (!) 183/77, pulse 76, temperature 98.6 F (37 C), resp. rate 14, height 5\' 2"  (1.575 m), weight 151 lb (68.5 kg), SpO2 93 %. Wt Readings from Last 3 Encounters:  09/11/21 151 lb (68.5 kg)  08/25/21 150 lb 12.8 oz (68.4 kg)  08/11/21 151 lb 6.4 oz (68.7 kg)   Body mass index is 27.62  kg/m. Performance status (ECOG): 1 Physical Exam Constitutional:      General: She is not in acute distress.    Appearance: Normal appearance. She is normal weight.  HENT:     Head: Normocephalic and atraumatic.  Eyes:     General: No scleral icterus.    Extraocular Movements: Extraocular movements intact.     Conjunctiva/sclera: Conjunctivae normal.     Pupils: Pupils are equal, round, and reactive to light.  Cardiovascular:     Rate and Rhythm: Normal rate and regular rhythm.     Pulses: Normal pulses.     Heart sounds: Normal heart sounds. No murmur heard.    No friction rub. No gallop.  Pulmonary:     Effort: Pulmonary effort is normal. No respiratory distress.     Breath sounds: Normal breath sounds.  Abdominal:     General: Bowel sounds are normal. There is no distension.     Palpations: Abdomen is soft. There is no hepatomegaly, splenomegaly or mass.     Tenderness: There is no abdominal tenderness.  Musculoskeletal:        General: Normal range of motion.     Cervical back: Normal range of motion and neck supple.     Right lower leg: No edema.     Left lower leg: No edema.  Lymphadenopathy:     Cervical: No cervical adenopathy.  Skin:    General: Skin is warm and dry.     Comments:    Neurological:     General: No focal deficit present.     Mental Status: She is  alert and oriented to person, place, and time. Mental status is at baseline.  Psychiatric:        Mood and Affect: Mood normal.        Behavior: Behavior normal.        Thought Content: Thought content normal.        Judgment: Judgment normal.    ASSESSMENT & PLAN:  Assessment/Plan:  A 81 y.o. female with recurrent/metastatic ovarian cancer.  As mentioned previously, the patient has ultimately decided not to pursue another line of chemotherapy for her.  Disease management.  The major plan moving forward for her will be to use whatever measures to maximize her daily quality of life for as long as she  continues to live.  The patient knows she has the ability to change her mind if she wishes to reconsider receiving another line of treatment.  However, for now, no scheduled follow-up visits will be made.  The patient and her daughter understand all the plans discussed today and are in agreement with them.    Samar Venneman Macarthur Critchley, MD

## 2021-09-11 ENCOUNTER — Inpatient Hospital Stay (INDEPENDENT_AMBULATORY_CARE_PROVIDER_SITE_OTHER): Payer: Medicare Other | Admitting: Oncology

## 2021-09-11 VITALS — BP 183/77 | HR 76 | Temp 98.6°F | Resp 14 | Ht 62.0 in | Wt 151.0 lb

## 2021-09-11 DIAGNOSIS — C563 Malignant neoplasm of bilateral ovaries: Secondary | ICD-10-CM | POA: Diagnosis not present

## 2021-09-15 DIAGNOSIS — J302 Other seasonal allergic rhinitis: Secondary | ICD-10-CM | POA: Diagnosis not present

## 2021-09-15 DIAGNOSIS — Z8543 Personal history of malignant neoplasm of ovary: Secondary | ICD-10-CM | POA: Diagnosis not present

## 2021-09-15 DIAGNOSIS — E0590 Thyrotoxicosis, unspecified without thyrotoxic crisis or storm: Secondary | ICD-10-CM | POA: Diagnosis not present

## 2021-09-15 DIAGNOSIS — C786 Secondary malignant neoplasm of retroperitoneum and peritoneum: Secondary | ICD-10-CM | POA: Diagnosis not present

## 2021-09-15 DIAGNOSIS — Z90722 Acquired absence of ovaries, bilateral: Secondary | ICD-10-CM | POA: Diagnosis not present

## 2021-09-15 DIAGNOSIS — I10 Essential (primary) hypertension: Secondary | ICD-10-CM | POA: Diagnosis not present

## 2021-09-15 DIAGNOSIS — I480 Paroxysmal atrial fibrillation: Secondary | ICD-10-CM | POA: Diagnosis not present

## 2021-09-15 DIAGNOSIS — F32A Depression, unspecified: Secondary | ICD-10-CM | POA: Diagnosis not present

## 2021-09-16 DIAGNOSIS — I10 Essential (primary) hypertension: Secondary | ICD-10-CM | POA: Diagnosis not present

## 2021-09-16 DIAGNOSIS — Z90722 Acquired absence of ovaries, bilateral: Secondary | ICD-10-CM | POA: Diagnosis not present

## 2021-09-16 DIAGNOSIS — C786 Secondary malignant neoplasm of retroperitoneum and peritoneum: Secondary | ICD-10-CM | POA: Diagnosis not present

## 2021-09-16 DIAGNOSIS — F32A Depression, unspecified: Secondary | ICD-10-CM | POA: Diagnosis not present

## 2021-09-16 DIAGNOSIS — Z8543 Personal history of malignant neoplasm of ovary: Secondary | ICD-10-CM | POA: Diagnosis not present

## 2021-09-16 DIAGNOSIS — I480 Paroxysmal atrial fibrillation: Secondary | ICD-10-CM | POA: Diagnosis not present

## 2021-09-17 DIAGNOSIS — F32A Depression, unspecified: Secondary | ICD-10-CM | POA: Diagnosis not present

## 2021-09-17 DIAGNOSIS — I10 Essential (primary) hypertension: Secondary | ICD-10-CM | POA: Diagnosis not present

## 2021-09-17 DIAGNOSIS — Z90722 Acquired absence of ovaries, bilateral: Secondary | ICD-10-CM | POA: Diagnosis not present

## 2021-09-17 DIAGNOSIS — I480 Paroxysmal atrial fibrillation: Secondary | ICD-10-CM | POA: Diagnosis not present

## 2021-09-17 DIAGNOSIS — Z8543 Personal history of malignant neoplasm of ovary: Secondary | ICD-10-CM | POA: Diagnosis not present

## 2021-09-17 DIAGNOSIS — C786 Secondary malignant neoplasm of retroperitoneum and peritoneum: Secondary | ICD-10-CM | POA: Diagnosis not present

## 2021-09-18 DIAGNOSIS — Z90722 Acquired absence of ovaries, bilateral: Secondary | ICD-10-CM | POA: Diagnosis not present

## 2021-09-18 DIAGNOSIS — F32A Depression, unspecified: Secondary | ICD-10-CM | POA: Diagnosis not present

## 2021-09-18 DIAGNOSIS — Z8543 Personal history of malignant neoplasm of ovary: Secondary | ICD-10-CM | POA: Diagnosis not present

## 2021-09-18 DIAGNOSIS — I10 Essential (primary) hypertension: Secondary | ICD-10-CM | POA: Diagnosis not present

## 2021-09-18 DIAGNOSIS — I480 Paroxysmal atrial fibrillation: Secondary | ICD-10-CM | POA: Diagnosis not present

## 2021-09-18 DIAGNOSIS — C786 Secondary malignant neoplasm of retroperitoneum and peritoneum: Secondary | ICD-10-CM | POA: Diagnosis not present

## 2021-09-23 DIAGNOSIS — C786 Secondary malignant neoplasm of retroperitoneum and peritoneum: Secondary | ICD-10-CM | POA: Diagnosis not present

## 2021-09-23 DIAGNOSIS — E0590 Thyrotoxicosis, unspecified without thyrotoxic crisis or storm: Secondary | ICD-10-CM | POA: Diagnosis not present

## 2021-09-23 DIAGNOSIS — F32A Depression, unspecified: Secondary | ICD-10-CM | POA: Diagnosis not present

## 2021-09-23 DIAGNOSIS — J302 Other seasonal allergic rhinitis: Secondary | ICD-10-CM | POA: Diagnosis not present

## 2021-09-23 DIAGNOSIS — I480 Paroxysmal atrial fibrillation: Secondary | ICD-10-CM | POA: Diagnosis not present

## 2021-09-23 DIAGNOSIS — I10 Essential (primary) hypertension: Secondary | ICD-10-CM | POA: Diagnosis not present

## 2021-09-23 DIAGNOSIS — Z90722 Acquired absence of ovaries, bilateral: Secondary | ICD-10-CM | POA: Diagnosis not present

## 2021-09-23 DIAGNOSIS — C569 Malignant neoplasm of unspecified ovary: Secondary | ICD-10-CM | POA: Diagnosis not present

## 2021-09-23 DIAGNOSIS — Z8543 Personal history of malignant neoplasm of ovary: Secondary | ICD-10-CM | POA: Diagnosis not present

## 2021-09-26 DIAGNOSIS — Z90722 Acquired absence of ovaries, bilateral: Secondary | ICD-10-CM | POA: Diagnosis not present

## 2021-09-26 DIAGNOSIS — F32A Depression, unspecified: Secondary | ICD-10-CM | POA: Diagnosis not present

## 2021-09-26 DIAGNOSIS — C786 Secondary malignant neoplasm of retroperitoneum and peritoneum: Secondary | ICD-10-CM | POA: Diagnosis not present

## 2021-09-26 DIAGNOSIS — I10 Essential (primary) hypertension: Secondary | ICD-10-CM | POA: Diagnosis not present

## 2021-09-26 DIAGNOSIS — Z8543 Personal history of malignant neoplasm of ovary: Secondary | ICD-10-CM | POA: Diagnosis not present

## 2021-09-26 DIAGNOSIS — I480 Paroxysmal atrial fibrillation: Secondary | ICD-10-CM | POA: Diagnosis not present

## 2021-10-01 DIAGNOSIS — F32A Depression, unspecified: Secondary | ICD-10-CM | POA: Diagnosis not present

## 2021-10-01 DIAGNOSIS — Z8543 Personal history of malignant neoplasm of ovary: Secondary | ICD-10-CM | POA: Diagnosis not present

## 2021-10-01 DIAGNOSIS — I10 Essential (primary) hypertension: Secondary | ICD-10-CM | POA: Diagnosis not present

## 2021-10-01 DIAGNOSIS — C786 Secondary malignant neoplasm of retroperitoneum and peritoneum: Secondary | ICD-10-CM | POA: Diagnosis not present

## 2021-10-01 DIAGNOSIS — Z90722 Acquired absence of ovaries, bilateral: Secondary | ICD-10-CM | POA: Diagnosis not present

## 2021-10-01 DIAGNOSIS — I480 Paroxysmal atrial fibrillation: Secondary | ICD-10-CM | POA: Diagnosis not present

## 2021-10-08 DIAGNOSIS — I480 Paroxysmal atrial fibrillation: Secondary | ICD-10-CM | POA: Diagnosis not present

## 2021-10-08 DIAGNOSIS — I10 Essential (primary) hypertension: Secondary | ICD-10-CM | POA: Diagnosis not present

## 2021-10-08 DIAGNOSIS — C786 Secondary malignant neoplasm of retroperitoneum and peritoneum: Secondary | ICD-10-CM | POA: Diagnosis not present

## 2021-10-08 DIAGNOSIS — Z90722 Acquired absence of ovaries, bilateral: Secondary | ICD-10-CM | POA: Diagnosis not present

## 2021-10-08 DIAGNOSIS — Z8543 Personal history of malignant neoplasm of ovary: Secondary | ICD-10-CM | POA: Diagnosis not present

## 2021-10-08 DIAGNOSIS — F32A Depression, unspecified: Secondary | ICD-10-CM | POA: Diagnosis not present

## 2021-10-15 DIAGNOSIS — C786 Secondary malignant neoplasm of retroperitoneum and peritoneum: Secondary | ICD-10-CM | POA: Diagnosis not present

## 2021-10-15 DIAGNOSIS — I480 Paroxysmal atrial fibrillation: Secondary | ICD-10-CM | POA: Diagnosis not present

## 2021-10-15 DIAGNOSIS — Z90722 Acquired absence of ovaries, bilateral: Secondary | ICD-10-CM | POA: Diagnosis not present

## 2021-10-15 DIAGNOSIS — I10 Essential (primary) hypertension: Secondary | ICD-10-CM | POA: Diagnosis not present

## 2021-10-15 DIAGNOSIS — F32A Depression, unspecified: Secondary | ICD-10-CM | POA: Diagnosis not present

## 2021-10-15 DIAGNOSIS — Z8543 Personal history of malignant neoplasm of ovary: Secondary | ICD-10-CM | POA: Diagnosis not present

## 2021-10-17 DIAGNOSIS — Z90722 Acquired absence of ovaries, bilateral: Secondary | ICD-10-CM | POA: Diagnosis not present

## 2021-10-17 DIAGNOSIS — Z8543 Personal history of malignant neoplasm of ovary: Secondary | ICD-10-CM | POA: Diagnosis not present

## 2021-10-17 DIAGNOSIS — F32A Depression, unspecified: Secondary | ICD-10-CM | POA: Diagnosis not present

## 2021-10-17 DIAGNOSIS — I480 Paroxysmal atrial fibrillation: Secondary | ICD-10-CM | POA: Diagnosis not present

## 2021-10-17 DIAGNOSIS — C786 Secondary malignant neoplasm of retroperitoneum and peritoneum: Secondary | ICD-10-CM | POA: Diagnosis not present

## 2021-10-17 DIAGNOSIS — I10 Essential (primary) hypertension: Secondary | ICD-10-CM | POA: Diagnosis not present

## 2021-10-18 DIAGNOSIS — I480 Paroxysmal atrial fibrillation: Secondary | ICD-10-CM | POA: Diagnosis not present

## 2021-10-18 DIAGNOSIS — C786 Secondary malignant neoplasm of retroperitoneum and peritoneum: Secondary | ICD-10-CM | POA: Diagnosis not present

## 2021-10-18 DIAGNOSIS — I10 Essential (primary) hypertension: Secondary | ICD-10-CM | POA: Diagnosis not present

## 2021-10-18 DIAGNOSIS — Z90722 Acquired absence of ovaries, bilateral: Secondary | ICD-10-CM | POA: Diagnosis not present

## 2021-10-18 DIAGNOSIS — F32A Depression, unspecified: Secondary | ICD-10-CM | POA: Diagnosis not present

## 2021-10-18 DIAGNOSIS — Z8543 Personal history of malignant neoplasm of ovary: Secondary | ICD-10-CM | POA: Diagnosis not present

## 2021-10-20 DIAGNOSIS — Z8543 Personal history of malignant neoplasm of ovary: Secondary | ICD-10-CM | POA: Diagnosis not present

## 2021-10-20 DIAGNOSIS — I480 Paroxysmal atrial fibrillation: Secondary | ICD-10-CM | POA: Diagnosis not present

## 2021-10-20 DIAGNOSIS — I10 Essential (primary) hypertension: Secondary | ICD-10-CM | POA: Diagnosis not present

## 2021-10-20 DIAGNOSIS — F32A Depression, unspecified: Secondary | ICD-10-CM | POA: Diagnosis not present

## 2021-10-20 DIAGNOSIS — Z90722 Acquired absence of ovaries, bilateral: Secondary | ICD-10-CM | POA: Diagnosis not present

## 2021-10-20 DIAGNOSIS — C786 Secondary malignant neoplasm of retroperitoneum and peritoneum: Secondary | ICD-10-CM | POA: Diagnosis not present

## 2021-10-21 DIAGNOSIS — Z90722 Acquired absence of ovaries, bilateral: Secondary | ICD-10-CM | POA: Diagnosis not present

## 2021-10-21 DIAGNOSIS — F32A Depression, unspecified: Secondary | ICD-10-CM | POA: Diagnosis not present

## 2021-10-21 DIAGNOSIS — C786 Secondary malignant neoplasm of retroperitoneum and peritoneum: Secondary | ICD-10-CM | POA: Diagnosis not present

## 2021-10-21 DIAGNOSIS — I480 Paroxysmal atrial fibrillation: Secondary | ICD-10-CM | POA: Diagnosis not present

## 2021-10-21 DIAGNOSIS — Z8543 Personal history of malignant neoplasm of ovary: Secondary | ICD-10-CM | POA: Diagnosis not present

## 2021-10-21 DIAGNOSIS — I10 Essential (primary) hypertension: Secondary | ICD-10-CM | POA: Diagnosis not present

## 2021-10-22 DIAGNOSIS — I480 Paroxysmal atrial fibrillation: Secondary | ICD-10-CM | POA: Diagnosis not present

## 2021-10-22 DIAGNOSIS — F32A Depression, unspecified: Secondary | ICD-10-CM | POA: Diagnosis not present

## 2021-10-22 DIAGNOSIS — I10 Essential (primary) hypertension: Secondary | ICD-10-CM | POA: Diagnosis not present

## 2021-10-22 DIAGNOSIS — Z90722 Acquired absence of ovaries, bilateral: Secondary | ICD-10-CM | POA: Diagnosis not present

## 2021-10-22 DIAGNOSIS — Z8543 Personal history of malignant neoplasm of ovary: Secondary | ICD-10-CM | POA: Diagnosis not present

## 2021-10-22 DIAGNOSIS — C786 Secondary malignant neoplasm of retroperitoneum and peritoneum: Secondary | ICD-10-CM | POA: Diagnosis not present

## 2021-10-24 DIAGNOSIS — I480 Paroxysmal atrial fibrillation: Secondary | ICD-10-CM | POA: Diagnosis not present

## 2021-10-24 DIAGNOSIS — Z8543 Personal history of malignant neoplasm of ovary: Secondary | ICD-10-CM | POA: Diagnosis not present

## 2021-10-24 DIAGNOSIS — F32A Depression, unspecified: Secondary | ICD-10-CM | POA: Diagnosis not present

## 2021-10-24 DIAGNOSIS — E0590 Thyrotoxicosis, unspecified without thyrotoxic crisis or storm: Secondary | ICD-10-CM | POA: Diagnosis not present

## 2021-10-24 DIAGNOSIS — I10 Essential (primary) hypertension: Secondary | ICD-10-CM | POA: Diagnosis not present

## 2021-10-24 DIAGNOSIS — Z90722 Acquired absence of ovaries, bilateral: Secondary | ICD-10-CM | POA: Diagnosis not present

## 2021-10-24 DIAGNOSIS — C786 Secondary malignant neoplasm of retroperitoneum and peritoneum: Secondary | ICD-10-CM | POA: Diagnosis not present

## 2021-10-24 DIAGNOSIS — J302 Other seasonal allergic rhinitis: Secondary | ICD-10-CM | POA: Diagnosis not present

## 2021-10-29 DIAGNOSIS — Z8543 Personal history of malignant neoplasm of ovary: Secondary | ICD-10-CM | POA: Diagnosis not present

## 2021-10-29 DIAGNOSIS — Z90722 Acquired absence of ovaries, bilateral: Secondary | ICD-10-CM | POA: Diagnosis not present

## 2021-10-29 DIAGNOSIS — F32A Depression, unspecified: Secondary | ICD-10-CM | POA: Diagnosis not present

## 2021-10-29 DIAGNOSIS — I10 Essential (primary) hypertension: Secondary | ICD-10-CM | POA: Diagnosis not present

## 2021-10-29 DIAGNOSIS — C786 Secondary malignant neoplasm of retroperitoneum and peritoneum: Secondary | ICD-10-CM | POA: Diagnosis not present

## 2021-10-29 DIAGNOSIS — I480 Paroxysmal atrial fibrillation: Secondary | ICD-10-CM | POA: Diagnosis not present

## 2021-10-30 DIAGNOSIS — C786 Secondary malignant neoplasm of retroperitoneum and peritoneum: Secondary | ICD-10-CM | POA: Diagnosis not present

## 2021-10-30 DIAGNOSIS — I10 Essential (primary) hypertension: Secondary | ICD-10-CM | POA: Diagnosis not present

## 2021-10-30 DIAGNOSIS — Z90722 Acquired absence of ovaries, bilateral: Secondary | ICD-10-CM | POA: Diagnosis not present

## 2021-10-30 DIAGNOSIS — F32A Depression, unspecified: Secondary | ICD-10-CM | POA: Diagnosis not present

## 2021-10-30 DIAGNOSIS — I480 Paroxysmal atrial fibrillation: Secondary | ICD-10-CM | POA: Diagnosis not present

## 2021-10-30 DIAGNOSIS — Z8543 Personal history of malignant neoplasm of ovary: Secondary | ICD-10-CM | POA: Diagnosis not present

## 2021-11-05 DIAGNOSIS — Z8543 Personal history of malignant neoplasm of ovary: Secondary | ICD-10-CM | POA: Diagnosis not present

## 2021-11-05 DIAGNOSIS — I10 Essential (primary) hypertension: Secondary | ICD-10-CM | POA: Diagnosis not present

## 2021-11-05 DIAGNOSIS — Z90722 Acquired absence of ovaries, bilateral: Secondary | ICD-10-CM | POA: Diagnosis not present

## 2021-11-05 DIAGNOSIS — F32A Depression, unspecified: Secondary | ICD-10-CM | POA: Diagnosis not present

## 2021-11-05 DIAGNOSIS — C786 Secondary malignant neoplasm of retroperitoneum and peritoneum: Secondary | ICD-10-CM | POA: Diagnosis not present

## 2021-11-05 DIAGNOSIS — I480 Paroxysmal atrial fibrillation: Secondary | ICD-10-CM | POA: Diagnosis not present

## 2021-11-06 DIAGNOSIS — C786 Secondary malignant neoplasm of retroperitoneum and peritoneum: Secondary | ICD-10-CM | POA: Diagnosis not present

## 2021-11-06 DIAGNOSIS — I10 Essential (primary) hypertension: Secondary | ICD-10-CM | POA: Diagnosis not present

## 2021-11-06 DIAGNOSIS — I480 Paroxysmal atrial fibrillation: Secondary | ICD-10-CM | POA: Diagnosis not present

## 2021-11-06 DIAGNOSIS — Z8543 Personal history of malignant neoplasm of ovary: Secondary | ICD-10-CM | POA: Diagnosis not present

## 2021-11-06 DIAGNOSIS — Z90722 Acquired absence of ovaries, bilateral: Secondary | ICD-10-CM | POA: Diagnosis not present

## 2021-11-06 DIAGNOSIS — F32A Depression, unspecified: Secondary | ICD-10-CM | POA: Diagnosis not present

## 2021-11-07 DIAGNOSIS — I10 Essential (primary) hypertension: Secondary | ICD-10-CM | POA: Diagnosis not present

## 2021-11-07 DIAGNOSIS — C786 Secondary malignant neoplasm of retroperitoneum and peritoneum: Secondary | ICD-10-CM | POA: Diagnosis not present

## 2021-11-07 DIAGNOSIS — F32A Depression, unspecified: Secondary | ICD-10-CM | POA: Diagnosis not present

## 2021-11-07 DIAGNOSIS — Z8543 Personal history of malignant neoplasm of ovary: Secondary | ICD-10-CM | POA: Diagnosis not present

## 2021-11-07 DIAGNOSIS — I480 Paroxysmal atrial fibrillation: Secondary | ICD-10-CM | POA: Diagnosis not present

## 2021-11-07 DIAGNOSIS — Z90722 Acquired absence of ovaries, bilateral: Secondary | ICD-10-CM | POA: Diagnosis not present

## 2021-11-10 DIAGNOSIS — H40013 Open angle with borderline findings, low risk, bilateral: Secondary | ICD-10-CM | POA: Diagnosis not present

## 2021-11-12 DIAGNOSIS — I10 Essential (primary) hypertension: Secondary | ICD-10-CM | POA: Diagnosis not present

## 2021-11-12 DIAGNOSIS — C786 Secondary malignant neoplasm of retroperitoneum and peritoneum: Secondary | ICD-10-CM | POA: Diagnosis not present

## 2021-11-12 DIAGNOSIS — I480 Paroxysmal atrial fibrillation: Secondary | ICD-10-CM | POA: Diagnosis not present

## 2021-11-12 DIAGNOSIS — Z90722 Acquired absence of ovaries, bilateral: Secondary | ICD-10-CM | POA: Diagnosis not present

## 2021-11-12 DIAGNOSIS — F32A Depression, unspecified: Secondary | ICD-10-CM | POA: Diagnosis not present

## 2021-11-12 DIAGNOSIS — Z8543 Personal history of malignant neoplasm of ovary: Secondary | ICD-10-CM | POA: Diagnosis not present

## 2021-11-19 DIAGNOSIS — Z8543 Personal history of malignant neoplasm of ovary: Secondary | ICD-10-CM | POA: Diagnosis not present

## 2021-11-19 DIAGNOSIS — F32A Depression, unspecified: Secondary | ICD-10-CM | POA: Diagnosis not present

## 2021-11-19 DIAGNOSIS — C786 Secondary malignant neoplasm of retroperitoneum and peritoneum: Secondary | ICD-10-CM | POA: Diagnosis not present

## 2021-11-19 DIAGNOSIS — Z90722 Acquired absence of ovaries, bilateral: Secondary | ICD-10-CM | POA: Diagnosis not present

## 2021-11-19 DIAGNOSIS — I480 Paroxysmal atrial fibrillation: Secondary | ICD-10-CM | POA: Diagnosis not present

## 2021-11-19 DIAGNOSIS — I10 Essential (primary) hypertension: Secondary | ICD-10-CM | POA: Diagnosis not present

## 2021-11-23 DIAGNOSIS — C786 Secondary malignant neoplasm of retroperitoneum and peritoneum: Secondary | ICD-10-CM | POA: Diagnosis not present

## 2021-11-23 DIAGNOSIS — Z90722 Acquired absence of ovaries, bilateral: Secondary | ICD-10-CM | POA: Diagnosis not present

## 2021-11-23 DIAGNOSIS — I480 Paroxysmal atrial fibrillation: Secondary | ICD-10-CM | POA: Diagnosis not present

## 2021-11-23 DIAGNOSIS — J302 Other seasonal allergic rhinitis: Secondary | ICD-10-CM | POA: Diagnosis not present

## 2021-11-23 DIAGNOSIS — E0590 Thyrotoxicosis, unspecified without thyrotoxic crisis or storm: Secondary | ICD-10-CM | POA: Diagnosis not present

## 2021-11-23 DIAGNOSIS — I10 Essential (primary) hypertension: Secondary | ICD-10-CM | POA: Diagnosis not present

## 2021-11-23 DIAGNOSIS — F32A Depression, unspecified: Secondary | ICD-10-CM | POA: Diagnosis not present

## 2021-11-23 DIAGNOSIS — Z8543 Personal history of malignant neoplasm of ovary: Secondary | ICD-10-CM | POA: Diagnosis not present

## 2021-11-24 DIAGNOSIS — Z90722 Acquired absence of ovaries, bilateral: Secondary | ICD-10-CM | POA: Diagnosis not present

## 2021-11-24 DIAGNOSIS — I10 Essential (primary) hypertension: Secondary | ICD-10-CM | POA: Diagnosis not present

## 2021-11-24 DIAGNOSIS — C786 Secondary malignant neoplasm of retroperitoneum and peritoneum: Secondary | ICD-10-CM | POA: Diagnosis not present

## 2021-11-24 DIAGNOSIS — F32A Depression, unspecified: Secondary | ICD-10-CM | POA: Diagnosis not present

## 2021-11-24 DIAGNOSIS — I480 Paroxysmal atrial fibrillation: Secondary | ICD-10-CM | POA: Diagnosis not present

## 2021-11-24 DIAGNOSIS — Z8543 Personal history of malignant neoplasm of ovary: Secondary | ICD-10-CM | POA: Diagnosis not present

## 2021-11-26 DIAGNOSIS — Z90722 Acquired absence of ovaries, bilateral: Secondary | ICD-10-CM | POA: Diagnosis not present

## 2021-11-26 DIAGNOSIS — F32A Depression, unspecified: Secondary | ICD-10-CM | POA: Diagnosis not present

## 2021-11-26 DIAGNOSIS — Z8543 Personal history of malignant neoplasm of ovary: Secondary | ICD-10-CM | POA: Diagnosis not present

## 2021-11-26 DIAGNOSIS — C786 Secondary malignant neoplasm of retroperitoneum and peritoneum: Secondary | ICD-10-CM | POA: Diagnosis not present

## 2021-11-26 DIAGNOSIS — I10 Essential (primary) hypertension: Secondary | ICD-10-CM | POA: Diagnosis not present

## 2021-11-26 DIAGNOSIS — I480 Paroxysmal atrial fibrillation: Secondary | ICD-10-CM | POA: Diagnosis not present

## 2021-11-28 DIAGNOSIS — F32A Depression, unspecified: Secondary | ICD-10-CM | POA: Diagnosis not present

## 2021-11-28 DIAGNOSIS — I10 Essential (primary) hypertension: Secondary | ICD-10-CM | POA: Diagnosis not present

## 2021-11-28 DIAGNOSIS — I480 Paroxysmal atrial fibrillation: Secondary | ICD-10-CM | POA: Diagnosis not present

## 2021-11-28 DIAGNOSIS — C786 Secondary malignant neoplasm of retroperitoneum and peritoneum: Secondary | ICD-10-CM | POA: Diagnosis not present

## 2021-11-28 DIAGNOSIS — Z8543 Personal history of malignant neoplasm of ovary: Secondary | ICD-10-CM | POA: Diagnosis not present

## 2021-11-28 DIAGNOSIS — Z90722 Acquired absence of ovaries, bilateral: Secondary | ICD-10-CM | POA: Diagnosis not present

## 2021-11-30 DIAGNOSIS — C786 Secondary malignant neoplasm of retroperitoneum and peritoneum: Secondary | ICD-10-CM | POA: Diagnosis not present

## 2021-11-30 DIAGNOSIS — I10 Essential (primary) hypertension: Secondary | ICD-10-CM | POA: Diagnosis not present

## 2021-11-30 DIAGNOSIS — Z90722 Acquired absence of ovaries, bilateral: Secondary | ICD-10-CM | POA: Diagnosis not present

## 2021-11-30 DIAGNOSIS — Z8543 Personal history of malignant neoplasm of ovary: Secondary | ICD-10-CM | POA: Diagnosis not present

## 2021-11-30 DIAGNOSIS — F32A Depression, unspecified: Secondary | ICD-10-CM | POA: Diagnosis not present

## 2021-11-30 DIAGNOSIS — I480 Paroxysmal atrial fibrillation: Secondary | ICD-10-CM | POA: Diagnosis not present

## 2021-12-01 DIAGNOSIS — I480 Paroxysmal atrial fibrillation: Secondary | ICD-10-CM | POA: Diagnosis not present

## 2021-12-01 DIAGNOSIS — Z90722 Acquired absence of ovaries, bilateral: Secondary | ICD-10-CM | POA: Diagnosis not present

## 2021-12-01 DIAGNOSIS — C786 Secondary malignant neoplasm of retroperitoneum and peritoneum: Secondary | ICD-10-CM | POA: Diagnosis not present

## 2021-12-01 DIAGNOSIS — I10 Essential (primary) hypertension: Secondary | ICD-10-CM | POA: Diagnosis not present

## 2021-12-01 DIAGNOSIS — Z8543 Personal history of malignant neoplasm of ovary: Secondary | ICD-10-CM | POA: Diagnosis not present

## 2021-12-01 DIAGNOSIS — F32A Depression, unspecified: Secondary | ICD-10-CM | POA: Diagnosis not present

## 2021-12-02 DIAGNOSIS — I480 Paroxysmal atrial fibrillation: Secondary | ICD-10-CM | POA: Diagnosis not present

## 2021-12-02 DIAGNOSIS — Z8543 Personal history of malignant neoplasm of ovary: Secondary | ICD-10-CM | POA: Diagnosis not present

## 2021-12-02 DIAGNOSIS — C786 Secondary malignant neoplasm of retroperitoneum and peritoneum: Secondary | ICD-10-CM | POA: Diagnosis not present

## 2021-12-02 DIAGNOSIS — F32A Depression, unspecified: Secondary | ICD-10-CM | POA: Diagnosis not present

## 2021-12-02 DIAGNOSIS — Z90722 Acquired absence of ovaries, bilateral: Secondary | ICD-10-CM | POA: Diagnosis not present

## 2021-12-02 DIAGNOSIS — I10 Essential (primary) hypertension: Secondary | ICD-10-CM | POA: Diagnosis not present

## 2021-12-24 DEATH — deceased

## 2022-01-14 ENCOUNTER — Other Ambulatory Visit: Payer: Medicare Other

## 2022-01-19 IMAGING — US US ABDOMEN LIMITED
1 series · 9 of 9 positions shown · non-contrast
Comparison: CT 07/21/2019.

CLINICAL DATA: Evaluation for abdominal seroma.

EXAM:
ULTRASOUND ABDOMEN LIMITED

[Series 1: us abdomen limited · 9 acquisitions, 9 frames shown]
[im 1/9]
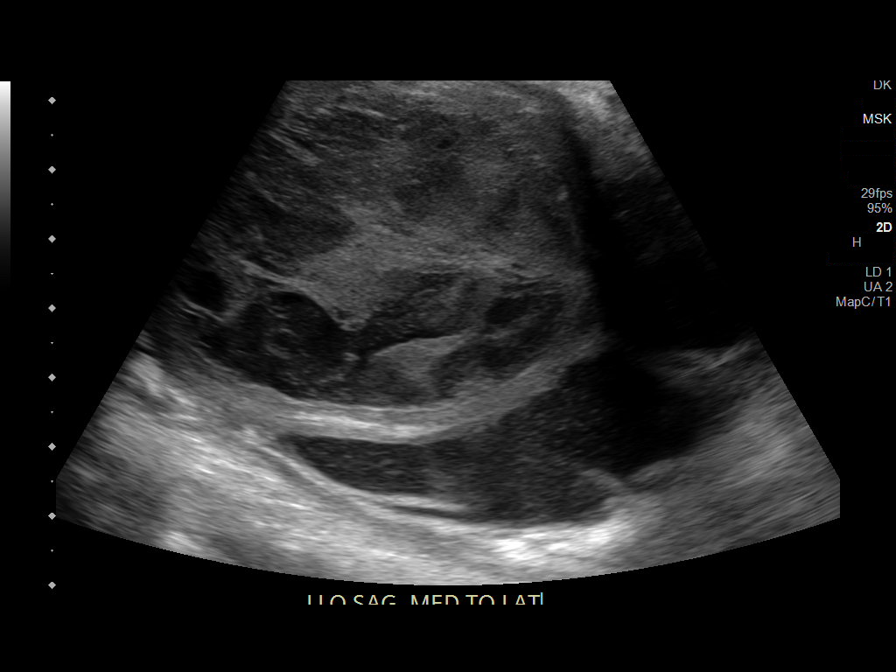
[im 2/9]
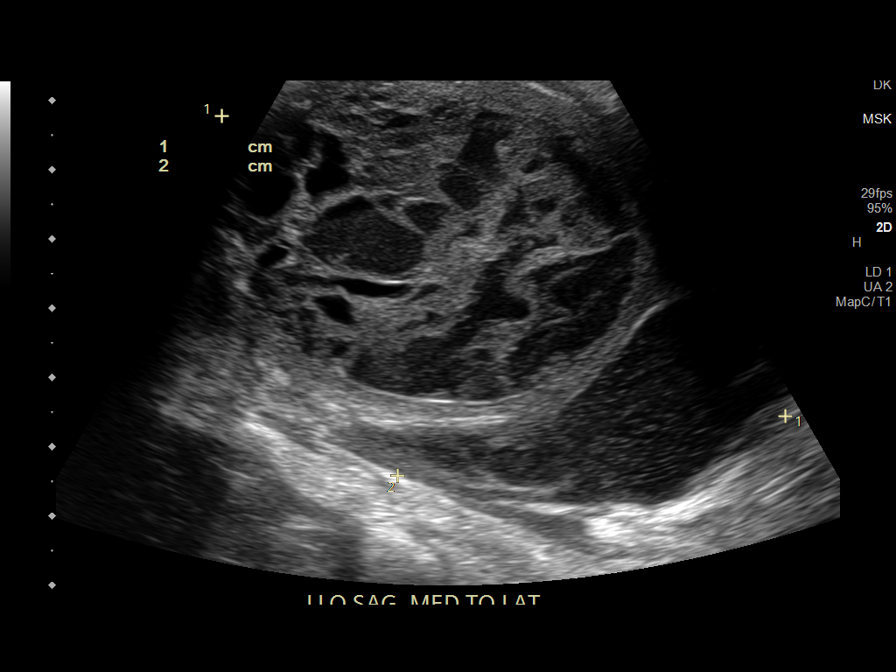
[im 3/9]
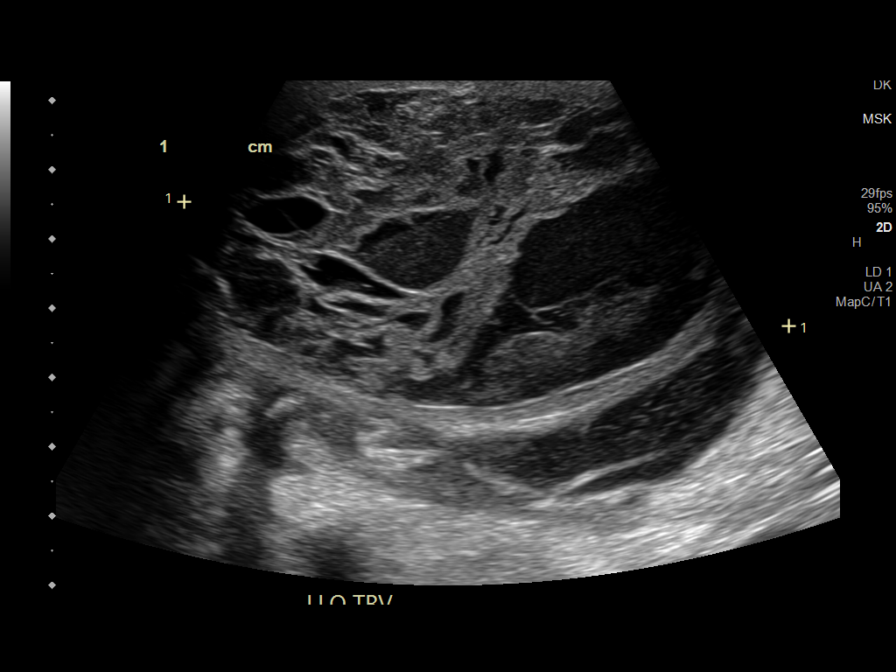
[im 4/9]
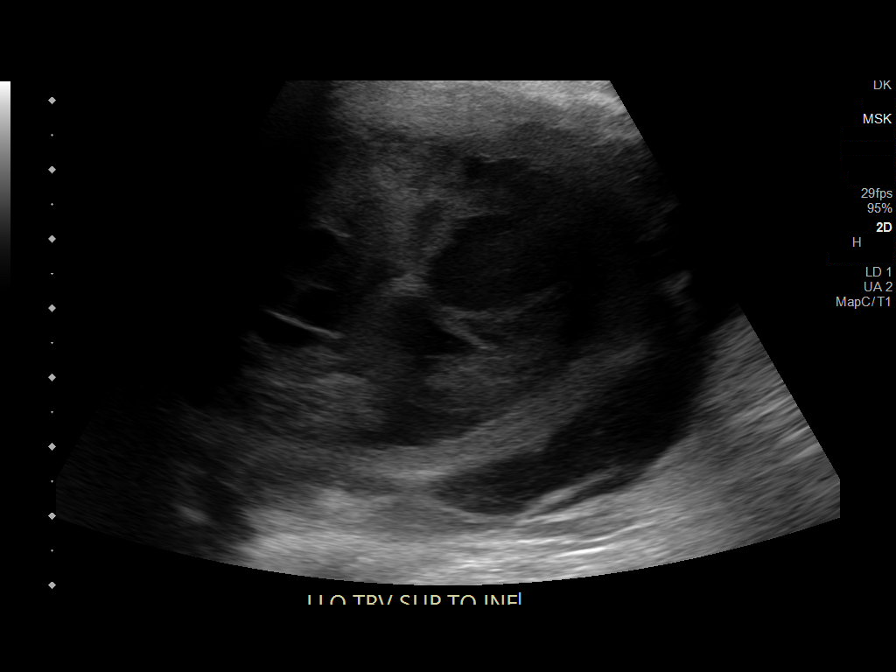
[im 5/9]
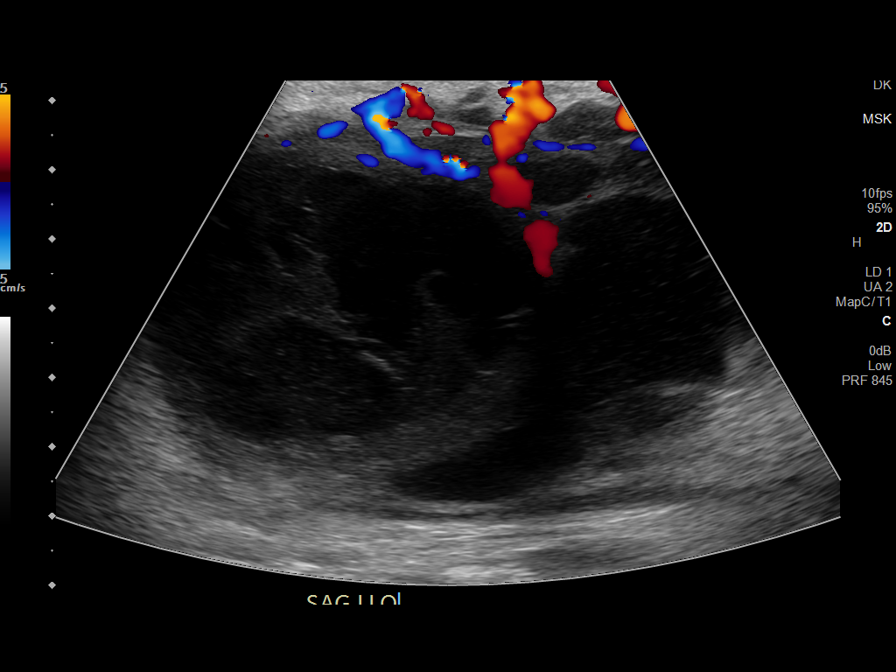
[im 6/9]
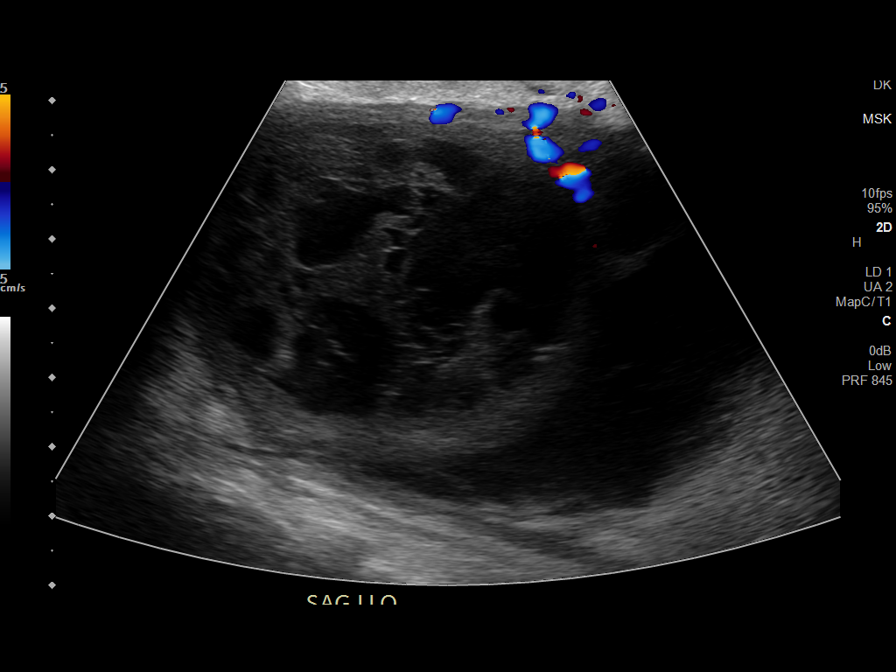
[im 7/9]
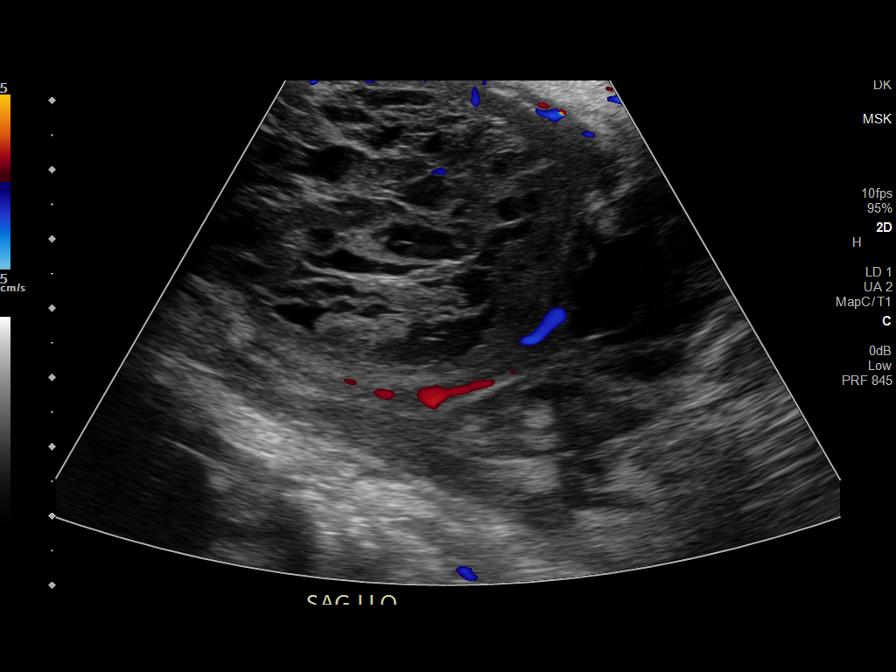
[im 8/9]
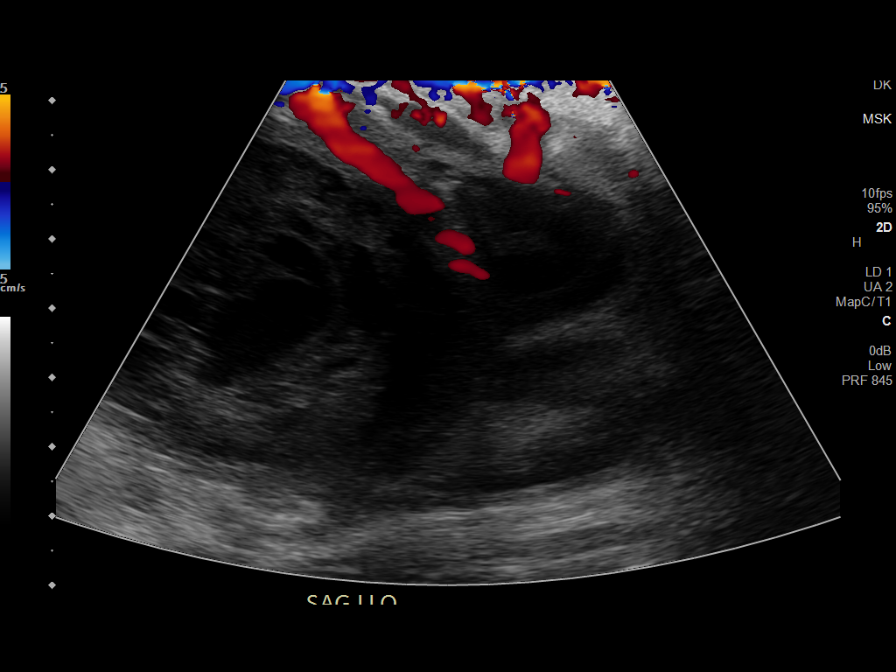
[im 9/9]
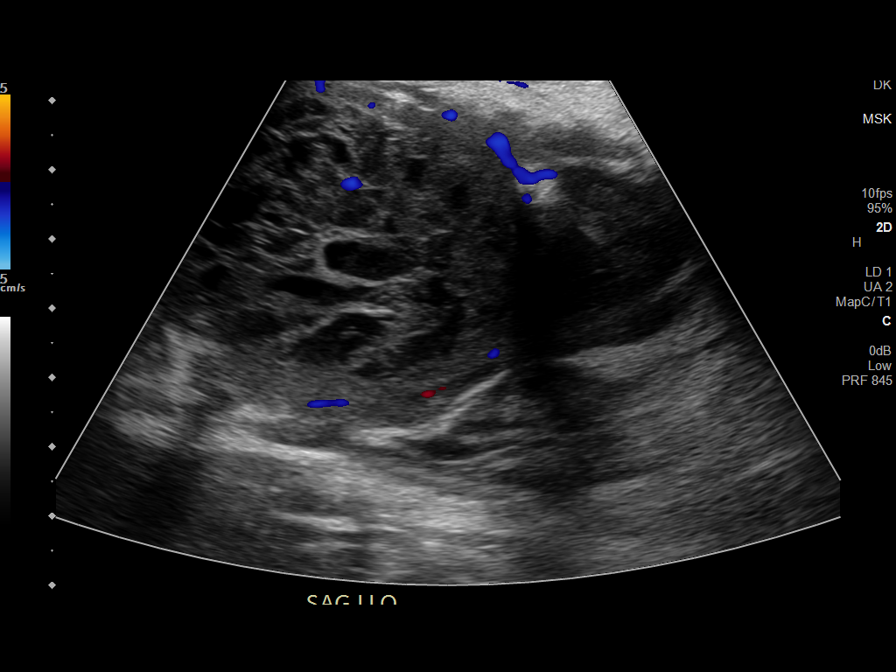

[9 of 9 positions shown; findings below may reference images not displayed]

FINDINGS: A 9.2 x 6.3 x 8.9 cm complex cystic mass with some internal
vascularity noted in the left lower quadrant in the region of
clinical concern. Process such as abscess or hematoma could present
this fashion. Tumor cannot be excluded. No other focal abnormality
identified.
IMPRESSION: A 9.2 x 6.3 x 8.9 cm complex cystic mass with some internal
vascularity noted the left lower quadrant in region of clinical
concern. A process such as an abscess or hematoma could present this
fashion. Tumor cannot be excluded.

## 2022-01-30 IMAGING — US US GUIDANCE NEEDLE PLACEMENT
1 series · 13 of 13 positions shown · non-contrast
Comparison: none

INDICATION: 79-year-old female status post abdominal hernia repair with
subsequent development of left lower quadrant subcutaneous complex
fluid collection. Presents for aspiration and possible drainage.

[Series 1: us guided needle placement · 13 acquisitions, 13 frames shown]
[im 1/13]
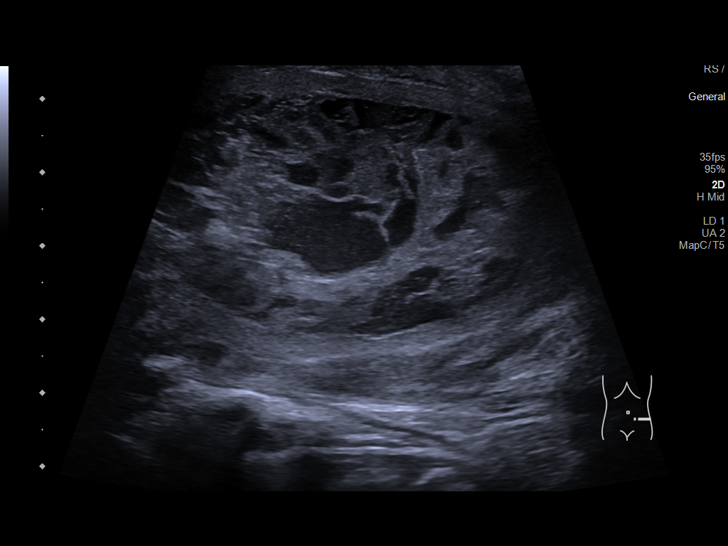
[im 2/13]
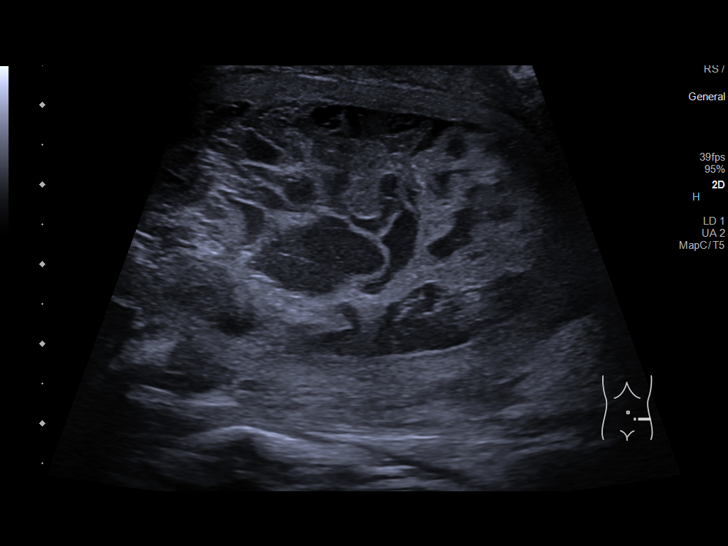
[im 3/13]
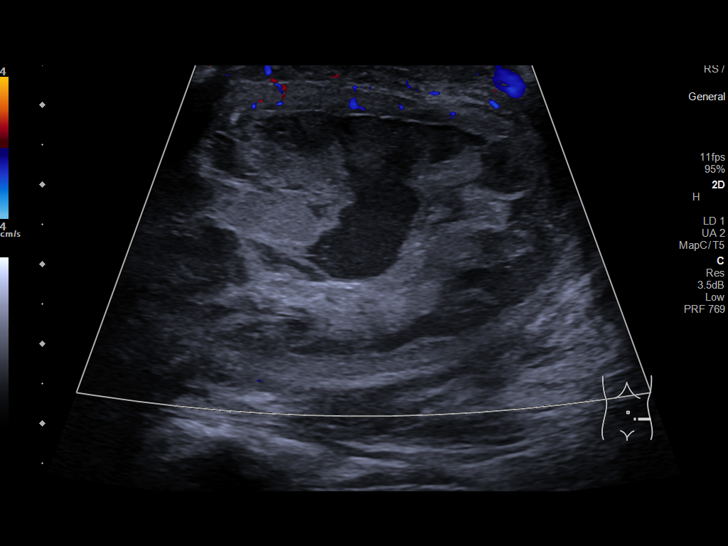
[im 4/13]
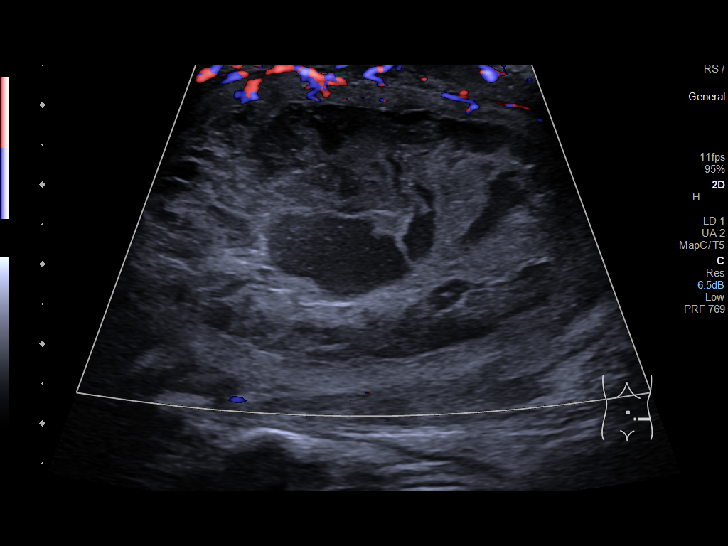
[im 5/13]
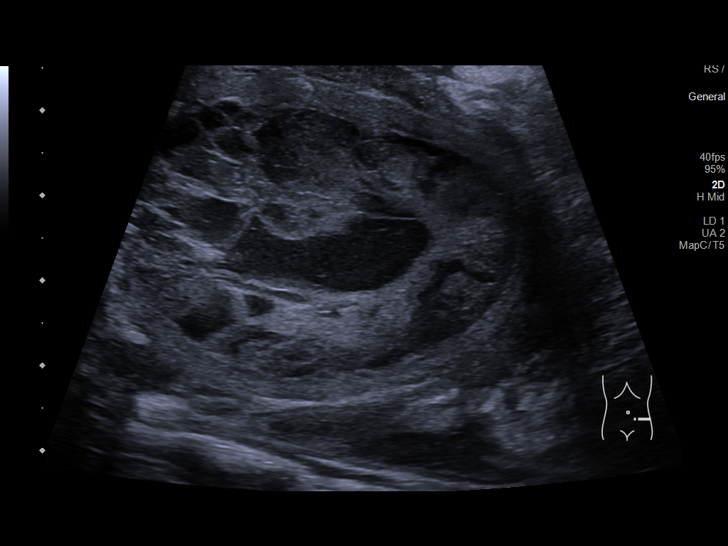
[im 6/13]
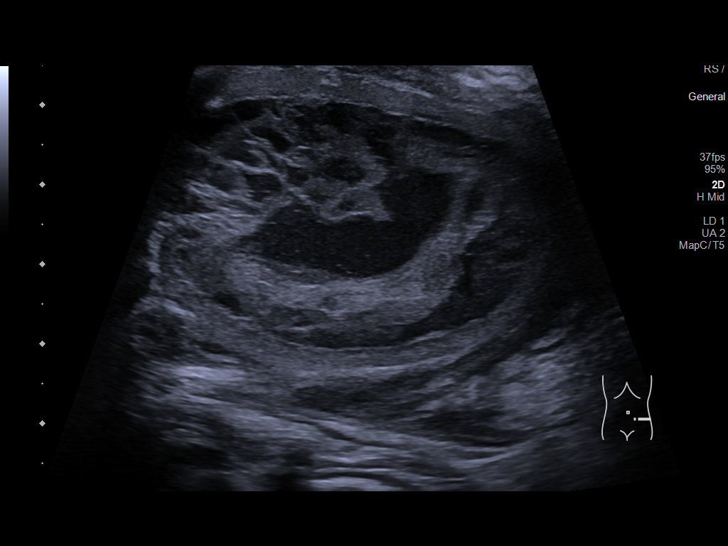
[im 7/13]
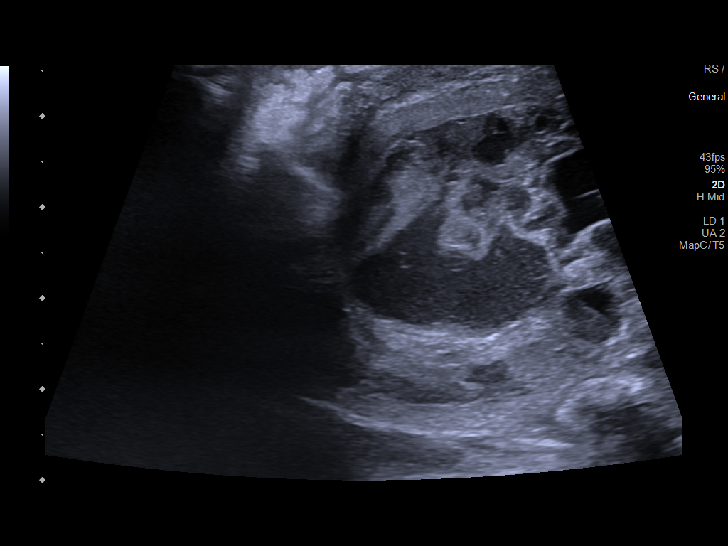
[im 8/13]
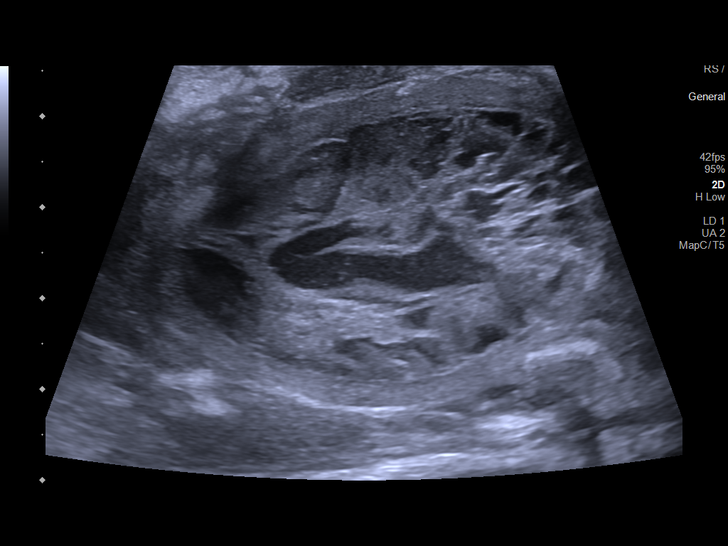
[im 9/13]
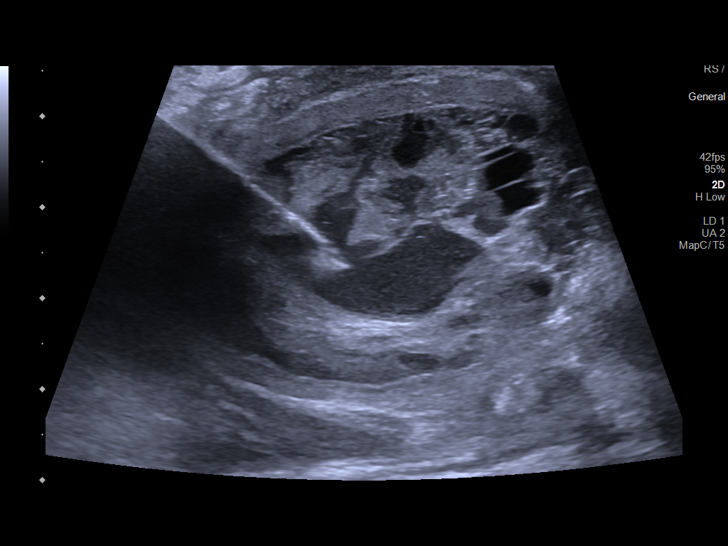
[im 10/13]
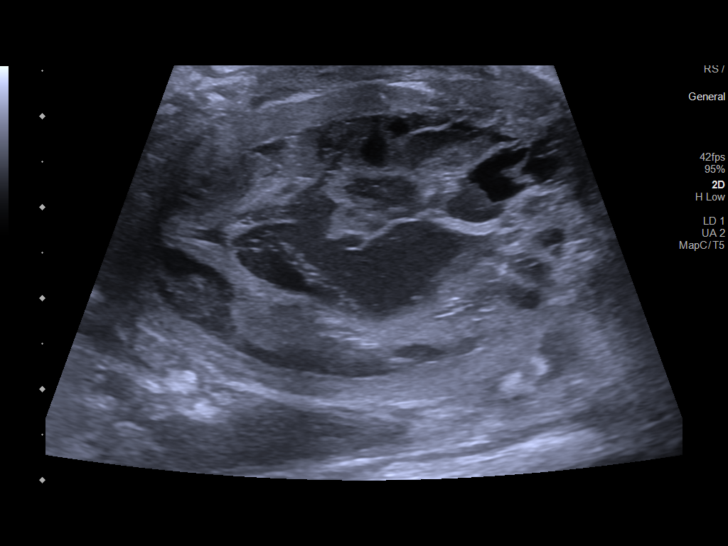
[im 11/13]
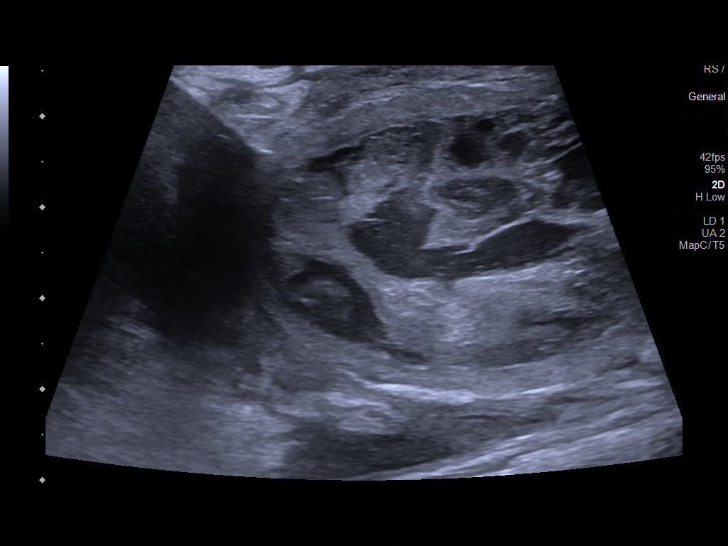
[im 12/13]
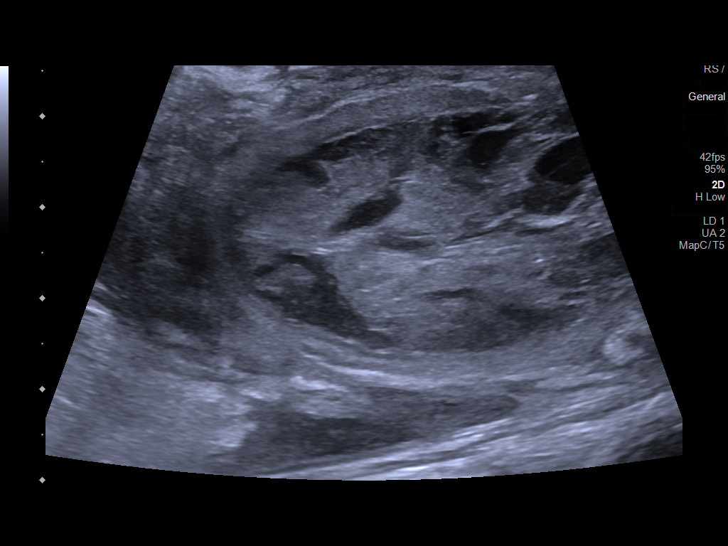
[im 13/13]
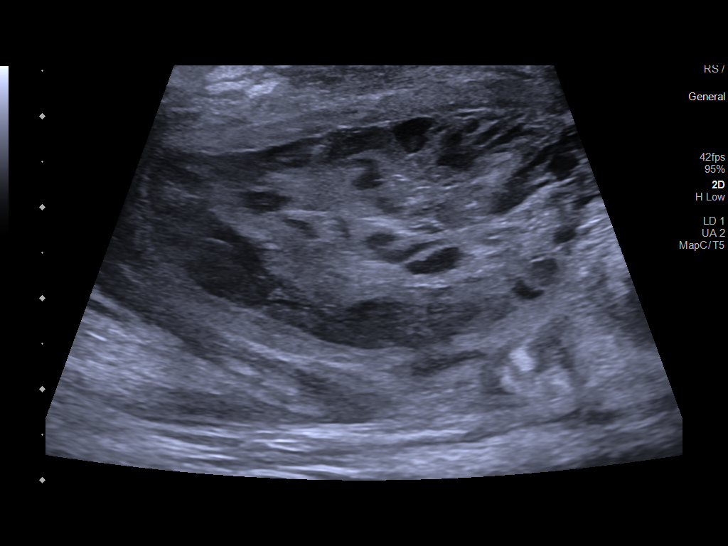

[13 of 13 positions shown; findings below may reference images not displayed]

EXAM:
Ultrasound-guided aspiration

MEDICATIONS:
None

ANESTHESIA/SEDATION:
Fentanyl 50 mcg IV; Versed 1 mg IV

Moderate Sedation Time:  10

The patient was continuously monitored during the procedure by the
interventional radiology nurse under my direct supervision.

COMPLICATIONS:
None immediate.

PROCEDURE:
Informed written consent was obtained from the patient after a
thorough discussion of the procedural risks, benefits and
alternatives. All questions were addressed. Maximal Sterile Barrier
Technique was utilized including caps, mask, sterile gowns, sterile
gloves, sterile drape, hand hygiene and skin antiseptic. A timeout
was performed prior to the initiation of the procedure.

Preprocedure ultrasound demonstrates complex fluid collection in the
subcutaneous tissues of the left lower quadrant. The collection
appears mostly solid, however there is a larger confluent central
hypoechoic area, therefore decision was made to aspirate. The left
lower quadrant was prepped and draped in standard fashion. A small
amount of subdermal local anesthesia was administered at the planned
needle entry site. Deeper local anesthetic was provided under
ultrasound guidance along the surface of the fluid collection. A
small skin nick was made. A 5 French Yueh needle is then directed
under ultrasound guidance into the cystic component and the needle
was removed. Aspiration was performed yielding approximately 5 mL of
purulent fluid. The sample was sent to lab for culture. The Yueh
catheter was removed. Postprocedure ultrasound demonstrates
decreased size of the central cystic area within the complex fluid
collection. The patient tolerated the procedure well without
immediate complication.
IMPRESSION: Complex fluid collection in the left lower quadrant subcutaneous
tissues, similar to comparison ultrasound from 11/03/2019.
Technically successful ultrasound-guided aspiration of central
cystic component. The 5 mL of aspirate was purulent, therefore sent
for culture.

## 2022-07-14 ENCOUNTER — Ambulatory Visit: Payer: Medicare Other | Admitting: Internal Medicine
